# Patient Record
Sex: Male | Born: 1965 | Race: White | Hispanic: No | Marital: Married | Smoking: Former smoker
Health system: Southern US, Community
[De-identification: ages and names within clinical notes are randomized; demographics above are authoritative.]

## PROBLEM LIST (undated history)

## (undated) DIAGNOSIS — F419 Anxiety disorder, unspecified: Secondary | ICD-10-CM

## (undated) DIAGNOSIS — M109 Gout, unspecified: Secondary | ICD-10-CM

## (undated) DIAGNOSIS — Z87891 Personal history of nicotine dependence: Secondary | ICD-10-CM

## (undated) DIAGNOSIS — I10 Essential (primary) hypertension: Secondary | ICD-10-CM

## (undated) DIAGNOSIS — G459 Transient cerebral ischemic attack, unspecified: Secondary | ICD-10-CM

## (undated) DIAGNOSIS — I2510 Atherosclerotic heart disease of native coronary artery without angina pectoris: Secondary | ICD-10-CM

## (undated) DIAGNOSIS — E782 Mixed hyperlipidemia: Secondary | ICD-10-CM

## (undated) DIAGNOSIS — I4891 Unspecified atrial fibrillation: Secondary | ICD-10-CM

## (undated) HISTORY — DX: Transient cerebral ischemic attack, unspecified: G45.9

## (undated) HISTORY — DX: Gout, unspecified: M10.9

## (undated) HISTORY — PX: CARDIAC CATHETERIZATION: SHX172

## (undated) HISTORY — DX: Unspecified atrial fibrillation: I48.91

---

## 2016-06-11 DIAGNOSIS — Z7289 Other problems related to lifestyle: Secondary | ICD-10-CM | POA: Insufficient documentation

## 2016-06-11 DIAGNOSIS — R0789 Other chest pain: Secondary | ICD-10-CM | POA: Insufficient documentation

## 2016-06-11 DIAGNOSIS — G4700 Insomnia, unspecified: Secondary | ICD-10-CM | POA: Insufficient documentation

## 2016-06-11 DIAGNOSIS — I10 Essential (primary) hypertension: Secondary | ICD-10-CM | POA: Insufficient documentation

## 2016-06-11 DIAGNOSIS — Z789 Other specified health status: Secondary | ICD-10-CM | POA: Insufficient documentation

## 2017-11-16 DIAGNOSIS — I700 Atherosclerosis of aorta: Secondary | ICD-10-CM | POA: Insufficient documentation

## 2019-03-23 DIAGNOSIS — Z23 Encounter for immunization: Secondary | ICD-10-CM | POA: Diagnosis not present

## 2019-03-23 DIAGNOSIS — S61411A Laceration without foreign body of right hand, initial encounter: Secondary | ICD-10-CM | POA: Diagnosis not present

## 2019-04-05 ENCOUNTER — Other Ambulatory Visit (HOSPITAL_COMMUNITY)
Admission: RE | Admit: 2019-04-05 | Discharge: 2019-04-05 | Disposition: A | Discharge status: Home / Self Care | Payer: BC Managed Care – PPO | Source: Ambulatory Visit | Attending: Orthopedic Surgery | Admitting: Orthopedic Surgery

## 2019-04-05 ENCOUNTER — Encounter (HOSPITAL_BASED_OUTPATIENT_CLINIC_OR_DEPARTMENT_OTHER)
Admission: RE | Admit: 2019-04-05 | Discharge: 2019-04-05 | Disposition: A | Discharge status: Home / Self Care | Payer: BC Managed Care – PPO | Source: Ambulatory Visit | Attending: Orthopedic Surgery | Admitting: Orthopedic Surgery

## 2019-04-05 ENCOUNTER — Other Ambulatory Visit: Payer: Self-pay | Admitting: Orthopedic Surgery

## 2019-04-05 ENCOUNTER — Encounter (HOSPITAL_BASED_OUTPATIENT_CLINIC_OR_DEPARTMENT_OTHER): Payer: Self-pay | Admitting: *Deleted

## 2019-04-05 ENCOUNTER — Other Ambulatory Visit: Payer: Self-pay

## 2019-04-05 DIAGNOSIS — S66221A Laceration of extensor muscle, fascia and tendon of right thumb at wrist and hand level, initial encounter: Secondary | ICD-10-CM | POA: Diagnosis not present

## 2019-04-05 DIAGNOSIS — Z20828 Contact with and (suspected) exposure to other viral communicable diseases: Secondary | ICD-10-CM | POA: Diagnosis not present

## 2019-04-05 DIAGNOSIS — Z01812 Encounter for preprocedural laboratory examination: Secondary | ICD-10-CM | POA: Diagnosis not present

## 2019-04-05 DIAGNOSIS — M79644 Pain in right finger(s): Secondary | ICD-10-CM | POA: Diagnosis not present

## 2019-04-05 LAB — SARS CORONAVIRUS 2 (TAT 6-24 HRS): SARS Coronavirus 2: NEGATIVE

## 2019-04-05 NOTE — Progress Notes (Signed)

## 2019-04-07 ENCOUNTER — Other Ambulatory Visit: Payer: Self-pay

## 2019-04-07 ENCOUNTER — Ambulatory Visit (HOSPITAL_BASED_OUTPATIENT_CLINIC_OR_DEPARTMENT_OTHER)
Admission: RE | Admit: 2019-04-07 | Discharge: 2019-04-07 | Disposition: A | Discharge status: Home / Self Care | Payer: BC Managed Care – PPO | Attending: Orthopedic Surgery | Admitting: Orthopedic Surgery

## 2019-04-07 ENCOUNTER — Encounter (HOSPITAL_BASED_OUTPATIENT_CLINIC_OR_DEPARTMENT_OTHER)
Admission: RE | Disposition: A | Discharge status: Home / Self Care | Payer: Self-pay | Source: Home / Self Care | Attending: Orthopedic Surgery

## 2019-04-07 ENCOUNTER — Ambulatory Visit (HOSPITAL_BASED_OUTPATIENT_CLINIC_OR_DEPARTMENT_OTHER): Payer: BC Managed Care – PPO | Admitting: Anesthesiology

## 2019-04-07 ENCOUNTER — Encounter (HOSPITAL_BASED_OUTPATIENT_CLINIC_OR_DEPARTMENT_OTHER): Payer: Self-pay

## 2019-04-07 DIAGNOSIS — Z79899 Other long term (current) drug therapy: Secondary | ICD-10-CM | POA: Diagnosis not present

## 2019-04-07 DIAGNOSIS — Z7982 Long term (current) use of aspirin: Secondary | ICD-10-CM | POA: Insufficient documentation

## 2019-04-07 DIAGNOSIS — X58XXXA Exposure to other specified factors, initial encounter: Secondary | ICD-10-CM | POA: Insufficient documentation

## 2019-04-07 DIAGNOSIS — S61011A Laceration without foreign body of right thumb without damage to nail, initial encounter: Secondary | ICD-10-CM | POA: Insufficient documentation

## 2019-04-07 DIAGNOSIS — E782 Mixed hyperlipidemia: Secondary | ICD-10-CM | POA: Diagnosis not present

## 2019-04-07 DIAGNOSIS — S66221A Laceration of extensor muscle, fascia and tendon of right thumb at wrist and hand level, initial encounter: Secondary | ICD-10-CM | POA: Diagnosis not present

## 2019-04-07 DIAGNOSIS — S56321A Laceration of extensor or abductor muscles, fascia and tendons of right thumb at forearm level, initial encounter: Secondary | ICD-10-CM | POA: Diagnosis not present

## 2019-04-07 DIAGNOSIS — G8918 Other acute postprocedural pain: Secondary | ICD-10-CM | POA: Diagnosis not present

## 2019-04-07 DIAGNOSIS — I10 Essential (primary) hypertension: Secondary | ICD-10-CM | POA: Diagnosis not present

## 2019-04-07 HISTORY — PX: REPAIR EXTENSOR TENDON: SHX5382

## 2019-04-07 HISTORY — DX: Essential (primary) hypertension: I10

## 2019-04-07 HISTORY — DX: Mixed hyperlipidemia: E78.2

## 2019-04-07 HISTORY — DX: Anxiety disorder, unspecified: F41.9

## 2019-04-07 SURGERY — REPAIR, TENDON, EXTENSOR
Anesthesia: Choice | Site: Thumb | Laterality: Right

## 2019-04-07 MED ORDER — PROPOFOL 10 MG/ML IV BOLUS
INTRAVENOUS | Status: DC | PRN
  Administered 2019-04-07: 170 mg via INTRAVENOUS

## 2019-04-07 MED ORDER — MEPERIDINE HCL 25 MG/ML IJ SOLN: 6.2500 mg | INTRAMUSCULAR | Status: DC | PRN

## 2019-04-07 MED ORDER — LIDOCAINE HCL (CARDIAC) PF 100 MG/5ML IV SOSY
PREFILLED_SYRINGE | INTRAVENOUS | Status: DC | PRN
  Administered 2019-04-07: 60 mg via INTRAVENOUS

## 2019-04-07 MED ORDER — LACTATED RINGERS IV SOLN
INTRAVENOUS | Status: DC
  Administered 2019-04-07: 11:00:00 via INTRAVENOUS

## 2019-04-07 MED ORDER — MIDAZOLAM HCL 2 MG/2ML IJ SOLN
1.0000 mg | INTRAMUSCULAR | Status: DC | PRN
  Administered 2019-04-07: 2 mg via INTRAVENOUS

## 2019-04-07 MED ORDER — HYDROCODONE-ACETAMINOPHEN 5-325 MG PO TABS: ORAL_TABLET | ORAL | 0 refills | Status: DC

## 2019-04-07 MED ORDER — DEXAMETHASONE SODIUM PHOSPHATE 10 MG/ML IJ SOLN
INTRAMUSCULAR | Status: DC | PRN
  Administered 2019-04-07: 4 mg via INTRAVENOUS

## 2019-04-07 MED ORDER — ACETAMINOPHEN 10 MG/ML IV SOLN: 1000.0000 mg | Freq: Once | INTRAVENOUS | Status: DC | PRN

## 2019-04-07 MED ORDER — CEFAZOLIN SODIUM-DEXTROSE 2-4 GM/100ML-% IV SOLN
2.0000 g | INTRAVENOUS | Status: AC
  Administered 2019-04-07: 12:00:00 2 g via INTRAVENOUS

## 2019-04-07 MED ORDER — HYDROMORPHONE HCL 1 MG/ML IJ SOLN: 0.2500 mg | INTRAMUSCULAR | Status: DC | PRN

## 2019-04-07 MED ORDER — FENTANYL CITRATE (PF) 100 MCG/2ML IJ SOLN
INTRAMUSCULAR | Status: AC
  Filled 2019-04-07: qty 2

## 2019-04-07 MED ORDER — MIDAZOLAM HCL 2 MG/2ML IJ SOLN
INTRAMUSCULAR | Status: AC
  Filled 2019-04-07: qty 2

## 2019-04-07 MED ORDER — ROPIVACAINE HCL 5 MG/ML IJ SOLN
INTRAMUSCULAR | Status: DC | PRN
  Administered 2019-04-07: 30 mL via PERINEURAL

## 2019-04-07 MED ORDER — CEFAZOLIN SODIUM-DEXTROSE 2-4 GM/100ML-% IV SOLN
INTRAVENOUS | Status: AC
  Filled 2019-04-07: qty 100

## 2019-04-07 MED ORDER — CHLORHEXIDINE GLUCONATE 4 % EX LIQD: 60.0000 mL | Freq: Once | CUTANEOUS | Status: DC

## 2019-04-07 MED ORDER — CLONIDINE HCL (ANALGESIA) 100 MCG/ML EP SOLN
EPIDURAL | Status: DC | PRN
  Administered 2019-04-07: 100 ug

## 2019-04-07 MED ORDER — PROMETHAZINE HCL 25 MG/ML IJ SOLN: 6.2500 mg | INTRAMUSCULAR | Status: DC | PRN

## 2019-04-07 MED ORDER — FENTANYL CITRATE (PF) 100 MCG/2ML IJ SOLN
50.0000 ug | INTRAMUSCULAR | Status: DC | PRN
  Administered 2019-04-07: 100 ug via INTRAVENOUS

## 2019-04-07 MED ORDER — ONDANSETRON HCL 4 MG/2ML IJ SOLN
INTRAMUSCULAR | Status: DC | PRN
  Administered 2019-04-07: 4 mg via INTRAVENOUS

## 2019-04-07 MED ORDER — HYDROCODONE-ACETAMINOPHEN 7.5-325 MG PO TABS: 1.0000 | ORAL_TABLET | Freq: Once | ORAL | Status: DC | PRN

## 2019-04-07 SURGICAL SUPPLY — 85 items
BAG DECANTER FOR FLEXI CONT (MISCELLANEOUS) IMPLANT
BENZOIN TINCTURE PRP APPL 2/3 (GAUZE/BANDAGES/DRESSINGS) IMPLANT
BLADE MINI RND TIP GREEN BEAV (BLADE) IMPLANT
BLADE SURG 15 STRL LF DISP TIS (BLADE) ×2 IMPLANT
BLADE SURG 15 STRL SS (BLADE) ×4
BNDG CONFORM 2 STRL LF (GAUZE/BANDAGES/DRESSINGS) IMPLANT
BNDG ELASTIC 2X5.8 VLCR STR LF (GAUZE/BANDAGES/DRESSINGS) IMPLANT
BNDG ELASTIC 3X5.8 VLCR STR LF (GAUZE/BANDAGES/DRESSINGS) IMPLANT
BNDG ESMARK 4X9 LF (GAUZE/BANDAGES/DRESSINGS) ×3 IMPLANT
BNDG GAUZE ELAST 4 BULKY (GAUZE/BANDAGES/DRESSINGS) IMPLANT
CHLORAPREP W/TINT 26 (MISCELLANEOUS) ×3 IMPLANT
CLOSURE WOUND 1/2 X4 (GAUZE/BANDAGES/DRESSINGS)
CORD BIPOLAR FORCEPS 12FT (ELECTRODE) ×3 IMPLANT
COTTONBALL LRG STERILE PKG (GAUZE/BANDAGES/DRESSINGS) IMPLANT
COVER BACK TABLE REUSABLE LG (DRAPES) ×3 IMPLANT
COVER MAYO STAND REUSABLE (DRAPES) ×3 IMPLANT
COVER WAND RF STERILE (DRAPES) IMPLANT
CUFF TOURN SGL QUICK 18X4 (TOURNIQUET CUFF) ×3 IMPLANT
DECANTER SPIKE VIAL GLASS SM (MISCELLANEOUS) IMPLANT
DRAIN TLS ROUND 10FR (DRAIN) IMPLANT
DRAPE EXTREMITY T 121X128X90 (DISPOSABLE) ×3 IMPLANT
DRAPE OEC MINIVIEW 54X84 (DRAPES) IMPLANT
DRAPE SURG 17X23 STRL (DRAPES) ×3 IMPLANT
DRSG PAD ABDOMINAL 8X10 ST (GAUZE/BANDAGES/DRESSINGS) IMPLANT
GAUZE 4X4 16PLY RFD (DISPOSABLE) IMPLANT
GAUZE SPONGE 4X4 12PLY STRL (GAUZE/BANDAGES/DRESSINGS) ×3 IMPLANT
GAUZE XEROFORM 1X8 LF (GAUZE/BANDAGES/DRESSINGS) ×3 IMPLANT
GLOVE BIO SURGEON STRL SZ7.5 (GLOVE) ×3 IMPLANT
GLOVE BIOGEL PI IND STRL 6.5 (GLOVE) ×1 IMPLANT
GLOVE BIOGEL PI IND STRL 7.0 (GLOVE) ×1 IMPLANT
GLOVE BIOGEL PI IND STRL 8 (GLOVE) ×1 IMPLANT
GLOVE BIOGEL PI INDICATOR 6.5 (GLOVE) ×2
GLOVE BIOGEL PI INDICATOR 7.0 (GLOVE) ×2
GLOVE BIOGEL PI INDICATOR 8 (GLOVE) ×2
GOWN STRL REUS W/ TWL LRG LVL3 (GOWN DISPOSABLE) ×1 IMPLANT
GOWN STRL REUS W/TWL LRG LVL3 (GOWN DISPOSABLE) ×2
GOWN STRL REUS W/TWL XL LVL3 (GOWN DISPOSABLE) ×3 IMPLANT
K-WIRE .035X4 (WIRE) IMPLANT
LOOP VESSEL MAXI BLUE (MISCELLANEOUS) IMPLANT
NEEDLE HYPO 22GX1.5 SAFETY (NEEDLE) IMPLANT
NEEDLE HYPO 25X1 1.5 SAFETY (NEEDLE) IMPLANT
NEEDLE KEITH (NEEDLE) IMPLANT
NS IRRIG 1000ML POUR BTL (IV SOLUTION) ×3 IMPLANT
PACK BASIN DAY SURGERY FS (CUSTOM PROCEDURE TRAY) ×3 IMPLANT
PAD CAST 3X4 CTTN HI CHSV (CAST SUPPLIES) ×1 IMPLANT
PAD CAST 4YDX4 CTTN HI CHSV (CAST SUPPLIES) IMPLANT
PADDING CAST ABS 3INX4YD NS (CAST SUPPLIES)
PADDING CAST ABS 4INX4YD NS (CAST SUPPLIES) ×2
PADDING CAST ABS COTTON 3X4 (CAST SUPPLIES) IMPLANT
PADDING CAST ABS COTTON 4X4 ST (CAST SUPPLIES) ×1 IMPLANT
PADDING CAST COTTON 3X4 STRL (CAST SUPPLIES) ×2
PADDING CAST COTTON 4X4 STRL (CAST SUPPLIES)
SLEEVE SCD COMPRESS KNEE MED (MISCELLANEOUS) ×3 IMPLANT
SLING ARM FOAM STRAP XLG (SOFTGOODS) ×3 IMPLANT
SPLINT PLASTER CAST XFAST 3X15 (CAST SUPPLIES) ×20 IMPLANT
SPLINT PLASTER CAST XFAST 4X15 (CAST SUPPLIES) IMPLANT
SPLINT PLASTER XTRA FAST SET 4 (CAST SUPPLIES)
SPLINT PLASTER XTRA FASTSET 3X (CAST SUPPLIES) ×40
STOCKINETTE 4X48 STRL (DRAPES) ×3 IMPLANT
STRIP CLOSURE SKIN 1/2X4 (GAUZE/BANDAGES/DRESSINGS) IMPLANT
SUT CHROMIC 5 0 P 3 (SUTURE) IMPLANT
SUT ETHIBOND 3-0 V-5 (SUTURE) ×3 IMPLANT
SUT ETHILON 3 0 PS 1 (SUTURE) IMPLANT
SUT ETHILON 4 0 PS 2 18 (SUTURE) ×3 IMPLANT
SUT FIBERWIRE 3-0 18 TAPR NDL (SUTURE)
SUT FIBERWIRE 4-0 18 DIAM BLUE (SUTURE)
SUT MERSILENE 2.0 SH NDLE (SUTURE) IMPLANT
SUT MERSILENE 4 0 P 3 (SUTURE) IMPLANT
SUT MNCRL AB 4-0 PS2 18 (SUTURE) IMPLANT
SUT PROLENE 2 0 SH DA (SUTURE) IMPLANT
SUT PROLENE 6 0 P 1 18 (SUTURE) IMPLANT
SUT SILK 2 0 PERMA HAND 18 BK (SUTURE) ×6 IMPLANT
SUT SILK 4 0 PS 2 (SUTURE) IMPLANT
SUT VIC AB 3-0 PS1 18 (SUTURE)
SUT VIC AB 3-0 PS1 18XBRD (SUTURE) IMPLANT
SUT VIC AB 4-0 P-3 18XBRD (SUTURE) IMPLANT
SUT VIC AB 4-0 P3 18 (SUTURE)
SUT VICRYL 4-0 PS2 18IN ABS (SUTURE) IMPLANT
SUTURE FIBERWR 3-0 18 TAPR NDL (SUTURE) IMPLANT
SUTURE FIBERWR 4-0 18 DIA BLUE (SUTURE) IMPLANT
SYR BULB 3OZ (MISCELLANEOUS) ×3 IMPLANT
SYR CONTROL 10ML LL (SYRINGE) IMPLANT
TOWEL GREEN STERILE FF (TOWEL DISPOSABLE) ×6 IMPLANT
TUBE FEEDING ENTERAL 5FR 16IN (TUBING) IMPLANT
UNDERPAD 30X36 HEAVY ABSORB (UNDERPADS AND DIAPERS) ×3 IMPLANT

## 2019-04-07 NOTE — Anesthesia Postprocedure Evaluation (Signed)
Anesthesia Post Note  Patient: Sean Flynn  Procedure(s) Performed: REPAIR RIGHT THUMB EXTENSOR TENDON (Right Thumb)     Patient location during evaluation: PACU Anesthesia Type: General Level of consciousness: awake and alert Pain management: pain level controlled Vital Signs Assessment: post-procedure vital signs reviewed and stable Respiratory status: spontaneous breathing, nonlabored ventilation, respiratory function stable and patient connected to nasal cannula oxygen Cardiovascular status: blood pressure returned to baseline and stable Postop Assessment: no apparent nausea or vomiting Anesthetic complications: no    Last Vitals:  Vitals:   04/07/19 1330 04/07/19 1355  BP: (!) 141/83 (!) 136/93  Pulse: 72 73  Resp: 20 20  Temp:  36.9 C  SpO2: 94% 94%    Last Pain:  Vitals:   04/07/19 1355  TempSrc:   PainSc: 0-No pain                 Barnet Glasgow

## 2019-04-07 NOTE — Anesthesia Preprocedure Evaluation (Addendum)
Anesthesia Evaluation  Patient identified by MRN, date of birth, ID band Patient awake    Reviewed: Allergy & Precautions, NPO status , Patient's Chart, lab work & pertinent test results  History of Anesthesia Complications (+) PONV  Airway Mallampati: III  TM Distance: >3 FB Neck ROM: Full    Dental no notable dental hx. (+) Teeth Intact   Pulmonary neg pulmonary ROS,    Pulmonary exam normal breath sounds clear to auscultation       Cardiovascular hypertension, Pt. on medications Normal cardiovascular exam Rhythm:Regular Rate:Normal     Neuro/Psych Anxiety negative neurological ROS     GI/Hepatic Neg liver ROS,   Endo/Other  negative endocrine ROS  Renal/GU negative Renal ROS     Musculoskeletal negative musculoskeletal ROS (+)   Abdominal (+) + obese,   Peds  Hematology negative hematology ROS (+)   Anesthesia Other Findings   Reproductive/Obstetrics                            Anesthesia Physical Anesthesia Plan  ASA: III  Anesthesia Plan: General   Post-op Pain Management: GA combined w/ Regional for post-op pain   Induction:   PONV Risk Score and Plan: 3 and Treatment may vary due to age or medical condition, Ondansetron, Dexamethasone and Midazolam  Airway Management Planned: LMA  Additional Equipment:   Intra-op Plan:   Post-operative Plan:   Informed Consent: I have reviewed the patients History and Physical, chart, labs and discussed the procedure including the risks, benefits and alternatives for the proposed anesthesia with the patient or authorized representative who has indicated his/her understanding and acceptance.     Dental advisory given  Plan Discussed with: CRNA  Anesthesia Plan Comments: (LMA w Supraclavicular block)        Anesthesia Quick Evaluation

## 2019-04-07 NOTE — Discharge Instructions (Addendum)

## 2019-04-07 NOTE — H&P (Signed)
Sean Flynn is an 53 y.o. male.   Chief Complaint: right thumb extensor tendon lacerations HPI: 53 yo male sustained laceration to dorsum right thumb 2 weeks ago. Unable to extend thumb.  He wishes to proceed with operative exploration with repair of extensor tendon as necessary.  Allergies: No Known Allergies  Past Medical History:  Diagnosis Date  . Anxiety   . Hypertension   . Mixed hyperlipidemia     History reviewed. No pertinent surgical history.  Family History: History reviewed. No pertinent family history.  Social History:   reports that he has never smoked. He has never used smokeless tobacco. He reports current alcohol use. He reports that he does not use drugs.  Medications: No medications prior to admission.    Results for orders placed or performed during the hospital encounter of 04/05/19 (from the past 48 hour(s))  SARS CORONAVIRUS 2 (TAT 6-24 HRS) Nasopharyngeal Nasopharyngeal Swab     Status: None   Collection Time: 04/05/19 11:21 AM   Specimen: Nasopharyngeal Swab  Result Value Ref Range   SARS Coronavirus 2 NEGATIVE NEGATIVE    Comment: (NOTE) SARS-CoV-2 target nucleic acids are NOT DETECTED. The SARS-CoV-2 RNA is generally detectable in upper and lower respiratory specimens during the acute phase of infection. Negative results do not preclude SARS-CoV-2 infection, do not rule out co-infections with other pathogens, and should not be used as the sole basis for treatment or other patient management decisions. Negative results must be combined with clinical observations, patient history, and epidemiological information. The expected result is Negative. Fact Sheet for Patients: SugarRoll.be Fact Sheet for Healthcare Providers: https://www.woods-mathews.com/ This test is not yet approved or cleared by the [place] FDA and  has been authorized for detection and/or diagnosis of SARS-CoV-2 by FDA under an  Emergency Use Authorization (EUA). This EUA will remain  in effect (meaning this test can be used) for the duration of the COVID-19 declaration under Section 56 4(b)(1) of the Act, 21 U.S.C. section 360bbb-3(b)(1), unless the authorization is terminated or revoked sooner. Performed at [facility], [place] [address], [place], [place] [address]     No results found.   A comprehensive review of systems was negative.  Height 5\' 9"  (1.753 m), weight 111.1 kg.  General appearance: alert, cooperative and appears stated age Head: Normocephalic, without obvious abnormality, atraumatic Neck: supple, symmetrical, trachea midline Cardio: regular rate and rhythm Resp: clear to auscultation bilaterally Extremities: Intact sensation and capillary refill all digits.  +fpl/io.  No wounds. Unable to extend thumb, especially at mp joint. Pulses: 2+ and symmetric Skin: Skin color, texture, turgor normal. No rashes or lesions Neurologic: Grossly normal Incision/Wound: healed wound at dorsum right thumb mp joint  Assessment/Plan Right thumb laceration with extensor tendon laceration.  Non operative and operative treatment options have been discussed with the patient and patient wishes to proceed with operative treatment. Risks, benefits, and alternatives of surgery have been discussed and the patient agrees with the plan of care.   [name] 04/07/2019, 8:28 AM

## 2019-04-07 NOTE — Anesthesia Procedure Notes (Signed)
Procedure Name: LMA Insertion Date/Time: 04/07/2019 12:03 PM Performed by: Raenette Rover, CRNA Pre-anesthesia Checklist: Patient identified, Emergency Drugs available, Suction available and Patient being monitored Patient Re-evaluated:Patient Re-evaluated prior to induction Oxygen Delivery Method: Circle system utilized Preoxygenation: Pre-oxygenation with 100% oxygen Induction Type: IV induction LMA: LMA inserted LMA Size: 4.0 Number of attempts: 1 Placement Confirmation: positive ETCO2 and breath sounds checked- equal and bilateral Tube secured with: Tape Dental Injury: Teeth and Oropharynx as per pre-operative assessment

## 2019-04-07 NOTE — Progress Notes (Signed)
Assisted Dr. Houser with right, ultrasound guided, supraclavicular block. Side rails up, monitors on throughout procedure. See vital signs in flow sheet. Tolerated Procedure well. 

## 2019-04-07 NOTE — Op Note (Signed)
NAME: Sean Flynn MEDICAL RECORD NO: [id number] DATE OF BIRTH: 12-06-1965 [facility] LOCATION: [facility] PHYSICIAN: Tami RibasKEVIN R. Ellisyn Icenhower, MD   OPERATIVE REPORT   DATE OF PROCEDURE: 04/07/19    PREOPERATIVE DIAGNOSIS:   Right thumb laceration with EPL and EPB lacerations   POSTOPERATIVE DIAGNOSIS:   Right thumb laceration with EPL and EPB lacerations   PROCEDURE:   1.  Right thumb repair of EPL laceration at MP joint 2.  Right thumb repair of EPB laceration at MP joint   SURGEON:  Betha LoaKevin Lindsy Cerullo, M.D.   ASSISTANT: none   ANESTHESIA:  General with regional   INTRAVENOUS FLUIDS:  Per anesthesia flow sheet.   ESTIMATED BLOOD LOSS:  Minimal.   COMPLICATIONS:  None.   SPECIMENS:  none   TOURNIQUET TIME:    Total Tourniquet Time Documented: Upper Arm (Right) - 40 minutes Total: Upper Arm (Right) - 40 minutes    DISPOSITION:  Stable to PACU.   INDICATIONS: 53 year old male states he sustained a laceration to the dorsum of his right thumb approximately 2 weeks ago.  He has been unable to extend the thumb.  Recommended operative exploration with repair of extensor tendons as necessary. Risks, benefits and alternatives of surgery were discussed including the risks of blood loss, infection, damage to nerves, vessels, tendons, ligaments, bone for surgery, need for additional surgery, complications with wound healing, continued pain, stiffness.  He voiced understanding of these risks and elected to proceed.  OPERATIVE COURSE:  After being identified preoperatively by myself,  the patient and I agreed on the procedure and site of the procedure.  The surgical site was marked.  Surgical consent had been signed. He was given IV antibiotics as preoperative antibiotic prophylaxis. He was transferred to the operating room and placed on the operating table in supine position with the Right upper extremity on an arm board.  General anesthesia was induced by the  anesthesiologist. A regional block had been performed by anesthesia in preoperative holding.   Right upper extremity was prepped and draped in normal sterile orthopedic fashion.  A surgical pause was performed between the surgeons, anesthesia, and operating room staff and all were in agreement as to the patient, procedure, and site of procedure.  Tourniquet at the proximal aspect of the extremity was inflated to 250 mmHg after exsanguination of the arm with an Esmarch bandage.    Incision was made over the previous wound which had healed.  This was extended both proximally and distally.  This was carried in subcutaneous tissues by spreading technique.  Soft tissues were mobilized.  There was laceration of the EPL and EPB tendons.  The distal stumps were easily located and freed up of early scarring.  The proximal stumps were able to be retrieved through the same wound and brought back out to length.  The early scarring attachments were taken down.  A 3-0 Ethibond suture was used in a modified Kessler technique to reapproximate the EPL tendon.  This was then oversewn with figure-of-eight sutures.  The EPB tendon was reapproximated using a figure-of-eight type suture with a 3-0 Ethibond.  This provided good reapproximation of the tendon ends.  There was some increased tension that was felt that this would be easily worked out with therapy.  The wound was copiously irrigated with sterile saline.  Was closed with 4-0 nylon in a horizontal mattress fashion.  It was dressed with sterile Xeroform 4 x 4's and wrapped with a Kerlix bandage.  A volar  thumb spica splint was placed with the wrist approximately 45 degrees extension and a thumb in full extension.  This was wrapped with Kerlix and Ace bandage.  The tourniquet was deflated at 40 minutes.  Fingertips were pink with brisk capillary refill after deflation of tourniquet.  The operative  drapes were broken down.  The patient was awoken from anesthesia safely.  He was  transferred back to the stretcher and taken to PACU in stable condition.  I will see him back in the office in 1 week for postoperative followup.  I will give him a prescription for Norco 5/325 1-2 tabs PO q6 hours prn pain, dispense # 20.   [name], MD Electronically signed, 04/07/19

## 2019-04-07 NOTE — Anesthesia Procedure Notes (Signed)
Anesthesia Regional Block: Supraclavicular block   Pre-Anesthetic Checklist: ,, timeout performed, Correct Patient, Correct Site, Correct Laterality, Correct Procedure, Correct Position, site marked, Risks and benefits discussed,  Surgical consent,  Pre-op evaluation,  At surgeon's request and post-op pain management  Laterality: Right  Prep: chloraprep       Needles:  Injection technique: Single-shot  Needle Type: Echogenic Needle     Needle Length: 5cm  Needle Gauge: 21     Additional Needles:   Procedures:,,,, ultrasound used (permanent image in chart),,,,  Narrative:  Start time: 04/07/2019 11:34 AM End time: 04/07/2019 11:44 AM Injection made incrementally with aspirations every 5 mL.  Performed by: Personally  Anesthesiologist: Barnet Glasgow, MD

## 2019-04-07 NOTE — Transfer of Care (Signed)
Immediate Anesthesia Transfer of Care Note  Patient: Sean Flynn  Procedure(s) Performed: REPAIR RIGHT THUMB EXTENSOR TENDON (Right Thumb)  Patient Location: PACU  Anesthesia Type:GA combined with regional for post-op pain  Level of Consciousness: awake, alert , oriented and patient cooperative  Airway & Oxygen Therapy: Patient Spontanous Breathing and Patient connected to face mask oxygen  Post-op Assessment: Report given to RN and Post -op Vital signs reviewed and stable  Post vital signs: Reviewed and stable  Last Vitals:  Vitals Value Taken Time  BP 145/83 04/07/19 1303  Temp    Pulse 70 04/07/19 1305  Resp 16 04/07/19 1305  SpO2 96 % 04/07/19 1305  Vitals shown include unvalidated device data.  Last Pain:  Vitals:   04/07/19 1046  TempSrc: Oral  PainSc: 6       Patients Stated Pain Goal: 6 (80/32/12 2482)  Complications: No apparent anesthesia complications

## 2019-04-08 ENCOUNTER — Encounter (HOSPITAL_BASED_OUTPATIENT_CLINIC_OR_DEPARTMENT_OTHER): Payer: Self-pay | Admitting: Orthopedic Surgery

## 2019-04-13 DIAGNOSIS — S66221A Laceration of extensor muscle, fascia and tendon of right thumb at wrist and hand level, initial encounter: Secondary | ICD-10-CM | POA: Diagnosis not present

## 2019-04-13 DIAGNOSIS — M25641 Stiffness of right hand, not elsewhere classified: Secondary | ICD-10-CM | POA: Diagnosis not present

## 2019-04-13 DIAGNOSIS — S66229A Laceration of extensor muscle, fascia and tendon of unspecified thumb at wrist and hand level, initial encounter: Secondary | ICD-10-CM | POA: Diagnosis not present

## 2019-05-04 DIAGNOSIS — M25641 Stiffness of right hand, not elsewhere classified: Secondary | ICD-10-CM | POA: Diagnosis not present

## 2019-05-04 DIAGNOSIS — S66229A Laceration of extensor muscle, fascia and tendon of unspecified thumb at wrist and hand level, initial encounter: Secondary | ICD-10-CM | POA: Diagnosis not present

## 2019-05-16 DIAGNOSIS — M25641 Stiffness of right hand, not elsewhere classified: Secondary | ICD-10-CM | POA: Diagnosis not present

## 2019-05-16 DIAGNOSIS — S66229A Laceration of extensor muscle, fascia and tendon of unspecified thumb at wrist and hand level, initial encounter: Secondary | ICD-10-CM | POA: Diagnosis not present

## 2019-05-30 DIAGNOSIS — S66229A Laceration of extensor muscle, fascia and tendon of unspecified thumb at wrist and hand level, initial encounter: Secondary | ICD-10-CM | POA: Diagnosis not present

## 2019-05-30 DIAGNOSIS — M25641 Stiffness of right hand, not elsewhere classified: Secondary | ICD-10-CM | POA: Diagnosis not present

## 2019-06-01 DIAGNOSIS — M9903 Segmental and somatic dysfunction of lumbar region: Secondary | ICD-10-CM | POA: Diagnosis not present

## 2019-06-01 DIAGNOSIS — M5136 Other intervertebral disc degeneration, lumbar region: Secondary | ICD-10-CM | POA: Diagnosis not present

## 2019-06-06 DIAGNOSIS — M5136 Other intervertebral disc degeneration, lumbar region: Secondary | ICD-10-CM | POA: Diagnosis not present

## 2019-06-06 DIAGNOSIS — M9903 Segmental and somatic dysfunction of lumbar region: Secondary | ICD-10-CM | POA: Diagnosis not present

## 2019-06-08 DIAGNOSIS — M5136 Other intervertebral disc degeneration, lumbar region: Secondary | ICD-10-CM | POA: Diagnosis not present

## 2019-06-08 DIAGNOSIS — M25641 Stiffness of right hand, not elsewhere classified: Secondary | ICD-10-CM | POA: Diagnosis not present

## 2019-06-08 DIAGNOSIS — M9903 Segmental and somatic dysfunction of lumbar region: Secondary | ICD-10-CM | POA: Diagnosis not present

## 2019-06-08 DIAGNOSIS — S66229A Laceration of extensor muscle, fascia and tendon of unspecified thumb at wrist and hand level, initial encounter: Secondary | ICD-10-CM | POA: Diagnosis not present

## 2019-06-09 DIAGNOSIS — M5136 Other intervertebral disc degeneration, lumbar region: Secondary | ICD-10-CM | POA: Diagnosis not present

## 2019-06-09 DIAGNOSIS — M9903 Segmental and somatic dysfunction of lumbar region: Secondary | ICD-10-CM | POA: Diagnosis not present

## 2019-06-13 DIAGNOSIS — M5136 Other intervertebral disc degeneration, lumbar region: Secondary | ICD-10-CM | POA: Diagnosis not present

## 2019-06-13 DIAGNOSIS — M9903 Segmental and somatic dysfunction of lumbar region: Secondary | ICD-10-CM | POA: Diagnosis not present

## 2019-06-14 DIAGNOSIS — M9903 Segmental and somatic dysfunction of lumbar region: Secondary | ICD-10-CM | POA: Diagnosis not present

## 2019-06-14 DIAGNOSIS — M5136 Other intervertebral disc degeneration, lumbar region: Secondary | ICD-10-CM | POA: Diagnosis not present

## 2019-06-15 DIAGNOSIS — M1A09X0 Idiopathic chronic gout, multiple sites, without tophus (tophi): Secondary | ICD-10-CM | POA: Insufficient documentation

## 2019-06-15 DIAGNOSIS — I10 Essential (primary) hypertension: Secondary | ICD-10-CM | POA: Diagnosis not present

## 2019-06-15 DIAGNOSIS — M549 Dorsalgia, unspecified: Secondary | ICD-10-CM | POA: Diagnosis not present

## 2019-06-15 DIAGNOSIS — M5136 Other intervertebral disc degeneration, lumbar region: Secondary | ICD-10-CM | POA: Diagnosis not present

## 2019-06-15 DIAGNOSIS — M5442 Lumbago with sciatica, left side: Secondary | ICD-10-CM | POA: Diagnosis not present

## 2019-06-15 DIAGNOSIS — E782 Mixed hyperlipidemia: Secondary | ICD-10-CM | POA: Diagnosis not present

## 2019-06-15 DIAGNOSIS — G8929 Other chronic pain: Secondary | ICD-10-CM | POA: Insufficient documentation

## 2019-06-15 DIAGNOSIS — M9903 Segmental and somatic dysfunction of lumbar region: Secondary | ICD-10-CM | POA: Diagnosis not present

## 2019-06-15 DIAGNOSIS — R7989 Other specified abnormal findings of blood chemistry: Secondary | ICD-10-CM | POA: Insufficient documentation

## 2019-06-15 DIAGNOSIS — E785 Hyperlipidemia, unspecified: Secondary | ICD-10-CM | POA: Insufficient documentation

## 2019-06-22 DIAGNOSIS — M9903 Segmental and somatic dysfunction of lumbar region: Secondary | ICD-10-CM | POA: Diagnosis not present

## 2019-06-22 DIAGNOSIS — M5136 Other intervertebral disc degeneration, lumbar region: Secondary | ICD-10-CM | POA: Diagnosis not present

## 2019-06-23 DIAGNOSIS — M5136 Other intervertebral disc degeneration, lumbar region: Secondary | ICD-10-CM | POA: Diagnosis not present

## 2019-06-23 DIAGNOSIS — M9903 Segmental and somatic dysfunction of lumbar region: Secondary | ICD-10-CM | POA: Diagnosis not present

## 2019-06-27 DIAGNOSIS — M9903 Segmental and somatic dysfunction of lumbar region: Secondary | ICD-10-CM | POA: Diagnosis not present

## 2019-06-27 DIAGNOSIS — M5136 Other intervertebral disc degeneration, lumbar region: Secondary | ICD-10-CM | POA: Diagnosis not present

## 2019-06-29 DIAGNOSIS — M9903 Segmental and somatic dysfunction of lumbar region: Secondary | ICD-10-CM | POA: Diagnosis not present

## 2019-06-29 DIAGNOSIS — M5136 Other intervertebral disc degeneration, lumbar region: Secondary | ICD-10-CM | POA: Diagnosis not present

## 2019-07-04 DIAGNOSIS — M9903 Segmental and somatic dysfunction of lumbar region: Secondary | ICD-10-CM | POA: Diagnosis not present

## 2019-07-04 DIAGNOSIS — M5136 Other intervertebral disc degeneration, lumbar region: Secondary | ICD-10-CM | POA: Diagnosis not present

## 2019-07-06 DIAGNOSIS — M5136 Other intervertebral disc degeneration, lumbar region: Secondary | ICD-10-CM | POA: Diagnosis not present

## 2019-07-06 DIAGNOSIS — M9903 Segmental and somatic dysfunction of lumbar region: Secondary | ICD-10-CM | POA: Diagnosis not present

## 2019-07-11 DIAGNOSIS — M5136 Other intervertebral disc degeneration, lumbar region: Secondary | ICD-10-CM | POA: Diagnosis not present

## 2019-07-11 DIAGNOSIS — M9903 Segmental and somatic dysfunction of lumbar region: Secondary | ICD-10-CM | POA: Diagnosis not present

## 2019-07-13 DIAGNOSIS — M5136 Other intervertebral disc degeneration, lumbar region: Secondary | ICD-10-CM | POA: Diagnosis not present

## 2019-07-13 DIAGNOSIS — M9903 Segmental and somatic dysfunction of lumbar region: Secondary | ICD-10-CM | POA: Diagnosis not present

## 2019-07-18 DIAGNOSIS — M9903 Segmental and somatic dysfunction of lumbar region: Secondary | ICD-10-CM | POA: Diagnosis not present

## 2019-07-18 DIAGNOSIS — M5136 Other intervertebral disc degeneration, lumbar region: Secondary | ICD-10-CM | POA: Diagnosis not present

## 2019-07-21 DIAGNOSIS — M5136 Other intervertebral disc degeneration, lumbar region: Secondary | ICD-10-CM | POA: Diagnosis not present

## 2019-07-21 DIAGNOSIS — M9903 Segmental and somatic dysfunction of lumbar region: Secondary | ICD-10-CM | POA: Diagnosis not present

## 2019-07-26 DIAGNOSIS — M9903 Segmental and somatic dysfunction of lumbar region: Secondary | ICD-10-CM | POA: Diagnosis not present

## 2019-07-26 DIAGNOSIS — M5136 Other intervertebral disc degeneration, lumbar region: Secondary | ICD-10-CM | POA: Diagnosis not present

## 2019-08-03 DIAGNOSIS — M5136 Other intervertebral disc degeneration, lumbar region: Secondary | ICD-10-CM | POA: Diagnosis not present

## 2019-08-03 DIAGNOSIS — M9903 Segmental and somatic dysfunction of lumbar region: Secondary | ICD-10-CM | POA: Diagnosis not present

## 2019-08-10 DIAGNOSIS — M5136 Other intervertebral disc degeneration, lumbar region: Secondary | ICD-10-CM | POA: Diagnosis not present

## 2019-08-10 DIAGNOSIS — M9903 Segmental and somatic dysfunction of lumbar region: Secondary | ICD-10-CM | POA: Diagnosis not present

## 2019-08-17 DIAGNOSIS — M5136 Other intervertebral disc degeneration, lumbar region: Secondary | ICD-10-CM | POA: Diagnosis not present

## 2019-08-17 DIAGNOSIS — M9903 Segmental and somatic dysfunction of lumbar region: Secondary | ICD-10-CM | POA: Diagnosis not present

## 2019-08-24 DIAGNOSIS — M5136 Other intervertebral disc degeneration, lumbar region: Secondary | ICD-10-CM | POA: Diagnosis not present

## 2019-08-24 DIAGNOSIS — M9903 Segmental and somatic dysfunction of lumbar region: Secondary | ICD-10-CM | POA: Diagnosis not present

## 2019-08-31 DIAGNOSIS — M5136 Other intervertebral disc degeneration, lumbar region: Secondary | ICD-10-CM | POA: Diagnosis not present

## 2019-08-31 DIAGNOSIS — M9903 Segmental and somatic dysfunction of lumbar region: Secondary | ICD-10-CM | POA: Diagnosis not present

## 2019-09-14 DIAGNOSIS — M9903 Segmental and somatic dysfunction of lumbar region: Secondary | ICD-10-CM | POA: Diagnosis not present

## 2019-09-14 DIAGNOSIS — M5136 Other intervertebral disc degeneration, lumbar region: Secondary | ICD-10-CM | POA: Diagnosis not present

## 2019-09-29 DIAGNOSIS — M9903 Segmental and somatic dysfunction of lumbar region: Secondary | ICD-10-CM | POA: Diagnosis not present

## 2019-09-29 DIAGNOSIS — M5136 Other intervertebral disc degeneration, lumbar region: Secondary | ICD-10-CM | POA: Diagnosis not present

## 2019-10-12 DIAGNOSIS — M5136 Other intervertebral disc degeneration, lumbar region: Secondary | ICD-10-CM | POA: Diagnosis not present

## 2019-10-12 DIAGNOSIS — M9903 Segmental and somatic dysfunction of lumbar region: Secondary | ICD-10-CM | POA: Diagnosis not present

## 2019-10-14 DIAGNOSIS — G8929 Other chronic pain: Secondary | ICD-10-CM | POA: Insufficient documentation

## 2019-10-14 DIAGNOSIS — H539 Unspecified visual disturbance: Secondary | ICD-10-CM | POA: Insufficient documentation

## 2019-10-14 DIAGNOSIS — R200 Anesthesia of skin: Secondary | ICD-10-CM | POA: Insufficient documentation

## 2019-10-14 DIAGNOSIS — M5442 Lumbago with sciatica, left side: Secondary | ICD-10-CM | POA: Insufficient documentation

## 2019-10-14 DIAGNOSIS — E782 Mixed hyperlipidemia: Secondary | ICD-10-CM | POA: Diagnosis not present

## 2019-10-14 DIAGNOSIS — R7989 Other specified abnormal findings of blood chemistry: Secondary | ICD-10-CM | POA: Diagnosis not present

## 2019-10-14 DIAGNOSIS — R739 Hyperglycemia, unspecified: Secondary | ICD-10-CM | POA: Insufficient documentation

## 2019-10-14 DIAGNOSIS — I10 Essential (primary) hypertension: Secondary | ICD-10-CM | POA: Diagnosis not present

## 2019-10-14 DIAGNOSIS — Z0000 Encounter for general adult medical examination without abnormal findings: Secondary | ICD-10-CM | POA: Diagnosis not present

## 2019-10-14 DIAGNOSIS — Z125 Encounter for screening for malignant neoplasm of prostate: Secondary | ICD-10-CM | POA: Diagnosis not present

## 2019-10-14 DIAGNOSIS — Z800 Family history of malignant neoplasm of digestive organs: Secondary | ICD-10-CM | POA: Insufficient documentation

## 2019-10-24 DIAGNOSIS — G8929 Other chronic pain: Secondary | ICD-10-CM | POA: Diagnosis not present

## 2019-10-24 DIAGNOSIS — R200 Anesthesia of skin: Secondary | ICD-10-CM | POA: Diagnosis not present

## 2019-10-24 DIAGNOSIS — M545 Low back pain: Secondary | ICD-10-CM | POA: Diagnosis not present

## 2019-10-24 DIAGNOSIS — M2578 Osteophyte, vertebrae: Secondary | ICD-10-CM | POA: Diagnosis not present

## 2019-10-26 DIAGNOSIS — M9903 Segmental and somatic dysfunction of lumbar region: Secondary | ICD-10-CM | POA: Diagnosis not present

## 2019-10-26 DIAGNOSIS — M5136 Other intervertebral disc degeneration, lumbar region: Secondary | ICD-10-CM | POA: Diagnosis not present

## 2019-11-03 ENCOUNTER — Inpatient Hospital Stay (HOSPITAL_COMMUNITY)
Admission: AD | Admit: 2019-11-03 | Discharge: 2019-11-11 | DRG: 234 | Disposition: A | Discharge status: Home / Self Care | Payer: BC Managed Care – PPO | Source: Ambulatory Visit | Attending: Cardiology | Admitting: Cardiology

## 2019-11-03 ENCOUNTER — Encounter (HOSPITAL_COMMUNITY)
Admission: AD | Disposition: A | Discharge status: Home / Self Care | Payer: Self-pay | Source: Ambulatory Visit | Attending: Cardiology

## 2019-11-03 ENCOUNTER — Encounter (HOSPITAL_COMMUNITY): Payer: Self-pay | Admitting: Cardiology

## 2019-11-03 DIAGNOSIS — M25461 Effusion, right knee: Secondary | ICD-10-CM | POA: Diagnosis not present

## 2019-11-03 DIAGNOSIS — M129 Arthropathy, unspecified: Secondary | ICD-10-CM

## 2019-11-03 DIAGNOSIS — Z8249 Family history of ischemic heart disease and other diseases of the circulatory system: Secondary | ICD-10-CM

## 2019-11-03 DIAGNOSIS — I2510 Atherosclerotic heart disease of native coronary artery without angina pectoris: Secondary | ICD-10-CM | POA: Diagnosis not present

## 2019-11-03 DIAGNOSIS — J9811 Atelectasis: Secondary | ICD-10-CM | POA: Diagnosis not present

## 2019-11-03 DIAGNOSIS — D62 Acute posthemorrhagic anemia: Secondary | ICD-10-CM | POA: Diagnosis not present

## 2019-11-03 DIAGNOSIS — R079 Chest pain, unspecified: Secondary | ICD-10-CM | POA: Diagnosis not present

## 2019-11-03 DIAGNOSIS — I739 Peripheral vascular disease, unspecified: Secondary | ICD-10-CM | POA: Diagnosis not present

## 2019-11-03 DIAGNOSIS — I249 Acute ischemic heart disease, unspecified: Secondary | ICD-10-CM | POA: Diagnosis not present

## 2019-11-03 DIAGNOSIS — M25561 Pain in right knee: Secondary | ICD-10-CM

## 2019-11-03 DIAGNOSIS — R0789 Other chest pain: Secondary | ICD-10-CM | POA: Diagnosis not present

## 2019-11-03 DIAGNOSIS — I2119 ST elevation (STEMI) myocardial infarction involving other coronary artery of inferior wall: Secondary | ICD-10-CM | POA: Diagnosis not present

## 2019-11-03 DIAGNOSIS — E782 Mixed hyperlipidemia: Secondary | ICD-10-CM | POA: Diagnosis present

## 2019-11-03 DIAGNOSIS — M109 Gout, unspecified: Secondary | ICD-10-CM | POA: Diagnosis present

## 2019-11-03 DIAGNOSIS — Z7982 Long term (current) use of aspirin: Secondary | ICD-10-CM | POA: Diagnosis not present

## 2019-11-03 DIAGNOSIS — Z951 Presence of aortocoronary bypass graft: Secondary | ICD-10-CM

## 2019-11-03 DIAGNOSIS — Z01818 Encounter for other preprocedural examination: Secondary | ICD-10-CM | POA: Diagnosis not present

## 2019-11-03 DIAGNOSIS — Z79899 Other long term (current) drug therapy: Secondary | ICD-10-CM

## 2019-11-03 DIAGNOSIS — I213 ST elevation (STEMI) myocardial infarction of unspecified site: Secondary | ICD-10-CM | POA: Diagnosis not present

## 2019-11-03 DIAGNOSIS — I340 Nonrheumatic mitral (valve) insufficiency: Secondary | ICD-10-CM | POA: Diagnosis not present

## 2019-11-03 DIAGNOSIS — I10 Essential (primary) hypertension: Secondary | ICD-10-CM | POA: Diagnosis not present

## 2019-11-03 DIAGNOSIS — Z87891 Personal history of nicotine dependence: Secondary | ICD-10-CM | POA: Diagnosis not present

## 2019-11-03 DIAGNOSIS — Z01811 Encounter for preprocedural respiratory examination: Secondary | ICD-10-CM

## 2019-11-03 DIAGNOSIS — Z20822 Contact with and (suspected) exposure to covid-19: Secondary | ICD-10-CM | POA: Diagnosis not present

## 2019-11-03 DIAGNOSIS — I25110 Atherosclerotic heart disease of native coronary artery with unstable angina pectoris: Secondary | ICD-10-CM | POA: Diagnosis not present

## 2019-11-03 HISTORY — PX: LEFT HEART CATH AND CORONARY ANGIOGRAPHY: CATH118249

## 2019-11-03 HISTORY — DX: Personal history of nicotine dependence: Z87.891

## 2019-11-03 LAB — PROTIME-INR
INR: 1.1 (ref 0.8–1.2)
Prothrombin Time: 13.8 seconds (ref 11.4–15.2)

## 2019-11-03 LAB — LIPID PANEL
Cholesterol: 149 mg/dL (ref 0–200)
HDL: 63 mg/dL (ref >40)
LDL Cholesterol: 38 mg/dL (ref 0–99)
Total CHOL/HDL Ratio: 2.4 RATIO
Triglycerides: 240 mg/dL — ABNORMAL HIGH (ref <150)
VLDL: 48 mg/dL — ABNORMAL HIGH (ref 0–40)

## 2019-11-03 LAB — COMPREHENSIVE METABOLIC PANEL
ALT: 36 U/L (ref 0–44)
AST: 34 U/L (ref 15–41)
Albumin: 3.8 g/dL (ref 3.5–5.0)
Alkaline Phosphatase: 36 U/L — ABNORMAL LOW (ref 38–126)
Anion gap: 12 (ref 5–15)
BUN: 12 mg/dL (ref 6–20)
CO2: 24 mmol/L (ref 22–32)
Calcium: 9.9 mg/dL (ref 8.9–10.3)
Chloride: 103 mmol/L (ref 98–111)
Creatinine, Ser: 0.98 mg/dL (ref 0.61–1.24)
GFR calc Af Amer: >60 mL/min (ref >60)
GFR calc non Af Amer: >60 mL/min (ref >60)
Glucose, Bld: 99 mg/dL (ref 70–99)
Potassium: 3.7 mmol/L (ref 3.5–5.1)
Sodium: 139 mmol/L (ref 135–145)
Total Bilirubin: 0.9 mg/dL (ref 0.3–1.2)
Total Protein: 6.2 g/dL — ABNORMAL LOW (ref 6.5–8.1)

## 2019-11-03 LAB — CBC
HCT: 42.2 % (ref 39.0–52.0)
Hemoglobin: 14.8 g/dL (ref 13.0–17.0)
MCH: 32.2 pg (ref 26.0–34.0)
MCHC: 35.1 g/dL (ref 30.0–36.0)
MCV: 91.7 fL (ref 80.0–100.0)
Platelets: 203 10*3/uL (ref 150–400)
RBC: 4.6 MIL/uL (ref 4.22–5.81)
RDW: 11.8 % (ref 11.5–15.5)
WBC: 7.5 10*3/uL (ref 4.0–10.5)
nRBC: 0 % (ref 0.0–0.2)

## 2019-11-03 LAB — HEMOGLOBIN A1C
Hgb A1c MFr Bld: 5.5 % (ref 4.8–5.6)
Hgb A1c MFr Bld: 5.5 % (ref 4.8–5.6)
Mean Plasma Glucose: 111.15 mg/dL
Mean Plasma Glucose: 111.15 mg/dL

## 2019-11-03 LAB — PREPARE RBC (CROSSMATCH)

## 2019-11-03 LAB — RESPIRATORY PANEL BY RT PCR (FLU A&B, COVID)
Influenza A by PCR: NEGATIVE
Influenza B by PCR: NEGATIVE
SARS Coronavirus 2 by RT PCR: NEGATIVE

## 2019-11-03 LAB — POCT I-STAT, CHEM 8
BUN: 12 mg/dL (ref 6–20)
Calcium, Ion: 1.3 mmol/L (ref 1.15–1.40)
Chloride: 101 mmol/L (ref 98–111)
Creatinine, Ser: 0.8 mg/dL (ref 0.61–1.24)
Glucose, Bld: 98 mg/dL (ref 70–99)
HCT: 42 % (ref 39.0–52.0)
Hemoglobin: 14.3 g/dL (ref 13.0–17.0)
Potassium: 3.7 mmol/L (ref 3.5–5.1)
Sodium: 138 mmol/L (ref 135–145)
TCO2: 26 mmol/L (ref 22–32)

## 2019-11-03 LAB — POCT ACTIVATED CLOTTING TIME
Activated Clotting Time: 219 seconds
Activated Clotting Time: 246 seconds

## 2019-11-03 LAB — MRSA PCR SCREENING: MRSA by PCR: NEGATIVE

## 2019-11-03 LAB — HIV ANTIBODY (ROUTINE TESTING W REFLEX): HIV Screen 4th Generation wRfx: NONREACTIVE

## 2019-11-03 LAB — APTT: aPTT: 27 seconds (ref 24–36)

## 2019-11-03 LAB — TROPONIN I (HIGH SENSITIVITY): Troponin I (High Sensitivity): 67 ng/L — ABNORMAL HIGH (ref <18)

## 2019-11-03 SURGERY — LEFT HEART CATH AND CORONARY ANGIOGRAPHY
Anesthesia: LOCAL

## 2019-11-03 MED ORDER — HYDRALAZINE HCL 20 MG/ML IJ SOLN: 10.0000 mg | INTRAMUSCULAR | Status: AC | PRN

## 2019-11-03 MED ORDER — SODIUM CHLORIDE 0.9% FLUSH: 3.0000 mL | INTRAVENOUS | Status: DC | PRN

## 2019-11-03 MED ORDER — MIDAZOLAM HCL 2 MG/2ML IJ SOLN
INTRAMUSCULAR | Status: DC | PRN
  Administered 2019-11-03: 1 mg via INTRAVENOUS

## 2019-11-03 MED ORDER — LABETALOL HCL 5 MG/ML IV SOLN: 10.0000 mg | INTRAVENOUS | Status: AC | PRN

## 2019-11-03 MED ORDER — NITROGLYCERIN 0.4 MG SL SUBL: 0.4000 mg | SUBLINGUAL_TABLET | SUBLINGUAL | Status: DC | PRN

## 2019-11-03 MED ORDER — CHLORHEXIDINE GLUCONATE CLOTH 2 % EX PADS: 6.0000 | MEDICATED_PAD | Freq: Once | CUTANEOUS | Status: DC

## 2019-11-03 MED ORDER — METOPROLOL TARTRATE 25 MG PO TABS
25.0000 mg | ORAL_TABLET | Freq: Two times a day (BID) | ORAL | Status: DC
  Administered 2019-11-03 – 2019-11-04 (×2): 25 mg via ORAL
  Filled 2019-11-03 (×3): qty 1

## 2019-11-03 MED ORDER — NOREPINEPHRINE 4 MG/250ML-% IV SOLN
0.0000 ug/min | INTRAVENOUS | Status: AC
  Filled 2019-11-03: qty 250

## 2019-11-03 MED ORDER — LIDOCAINE HCL (PF) 1 % IJ SOLN
INTRAMUSCULAR | Status: DC | PRN
  Administered 2019-11-03: 2 mL

## 2019-11-03 MED ORDER — MAGNESIUM SULFATE 50 % IJ SOLN
40.0000 meq | INTRAMUSCULAR | Status: AC
  Filled 2019-11-03: qty 9.85

## 2019-11-03 MED ORDER — ACETAMINOPHEN 325 MG PO TABS
650.0000 mg | ORAL_TABLET | ORAL | Status: DC | PRN
  Administered 2019-11-04 – 2019-11-05 (×2): 650 mg via ORAL
  Filled 2019-11-03 (×2): qty 2

## 2019-11-03 MED ORDER — HEPARIN SODIUM (PORCINE) 1000 UNIT/ML IJ SOLN
INTRAMUSCULAR | Status: DC | PRN
  Administered 2019-11-03: 8000 [IU] via INTRAVENOUS
  Administered 2019-11-03: 4000 [IU] via INTRAVENOUS

## 2019-11-03 MED ORDER — SODIUM CHLORIDE 0.9% FLUSH
3.0000 mL | Freq: Two times a day (BID) | INTRAVENOUS | Status: DC
  Administered 2019-11-06: 3 mL via INTRAVENOUS

## 2019-11-03 MED ORDER — TRANEXAMIC ACID (OHS) PUMP PRIME SOLUTION
2.0000 mg/kg | INTRAVENOUS | Status: AC
  Filled 2019-11-03: qty 2.27

## 2019-11-03 MED ORDER — NITROGLYCERIN IN D5W 200-5 MCG/ML-% IV SOLN
2.0000 ug/min | INTRAVENOUS | Status: AC
  Filled 2019-11-03: qty 250

## 2019-11-03 MED ORDER — VANCOMYCIN HCL 1500 MG/300ML IV SOLN
1500.0000 mg | INTRAVENOUS | Status: AC
  Filled 2019-11-03 (×2): qty 300

## 2019-11-03 MED ORDER — HEPARIN (PORCINE) IN NACL 1000-0.9 UT/500ML-% IV SOLN
INTRAVENOUS | Status: AC
  Filled 2019-11-03: qty 1000

## 2019-11-03 MED ORDER — ONDANSETRON HCL 4 MG/2ML IJ SOLN: 4.0000 mg | Freq: Four times a day (QID) | INTRAMUSCULAR | Status: DC | PRN

## 2019-11-03 MED ORDER — IOHEXOL 350 MG/ML SOLN
INTRAVENOUS | Status: AC
  Filled 2019-11-03: qty 1

## 2019-11-03 MED ORDER — INSULIN REGULAR(HUMAN) IN NACL 100-0.9 UT/100ML-% IV SOLN
INTRAVENOUS | Status: AC
  Filled 2019-11-03: qty 100

## 2019-11-03 MED ORDER — LIDOCAINE HCL (PF) 1 % IJ SOLN
INTRAMUSCULAR | Status: AC
  Filled 2019-11-03: qty 30

## 2019-11-03 MED ORDER — SODIUM CHLORIDE 0.9 % IV SOLN
INTRAVENOUS | Status: AC
  Filled 2019-11-03: qty 30

## 2019-11-03 MED ORDER — CHLORHEXIDINE GLUCONATE CLOTH 2 % EX PADS
6.0000 | MEDICATED_PAD | Freq: Once | CUTANEOUS | Status: AC
  Administered 2019-11-03: 6 via TOPICAL

## 2019-11-03 MED ORDER — ATORVASTATIN CALCIUM 80 MG PO TABS
80.0000 mg | ORAL_TABLET | Freq: Every day | ORAL | Status: DC
  Administered 2019-11-03 – 2019-11-04 (×2): 80 mg via ORAL
  Filled 2019-11-03 (×2): qty 1

## 2019-11-03 MED ORDER — MIDAZOLAM HCL 2 MG/2ML IJ SOLN
INTRAMUSCULAR | Status: AC
  Filled 2019-11-03: qty 2

## 2019-11-03 MED ORDER — HEPARIN (PORCINE) 25000 UT/250ML-% IV SOLN
1800.0000 [IU]/h | INTRAVENOUS | Status: DC
  Administered 2019-11-03: 1350 [IU]/h via INTRAVENOUS
  Administered 2019-11-04: 1400 [IU]/h via INTRAVENOUS
  Administered 2019-11-05 – 2019-11-06 (×3): 1600 [IU]/h via INTRAVENOUS
  Filled 2019-11-03 (×5): qty 250

## 2019-11-03 MED ORDER — HEPARIN (PORCINE) IN NACL 1000-0.9 UT/500ML-% IV SOLN
INTRAVENOUS | Status: DC | PRN
  Administered 2019-11-03 (×2): 500 mL

## 2019-11-03 MED ORDER — DEXMEDETOMIDINE HCL IN NACL 400 MCG/100ML IV SOLN
0.2000 ug/kg/h | INTRAVENOUS | Status: DC
  Administered 2019-11-03: 0.2 ug/kg/h via INTRAVENOUS
  Filled 2019-11-03: qty 100

## 2019-11-03 MED ORDER — SODIUM CHLORIDE 0.9 % IV SOLN: INTRAVENOUS | Status: AC

## 2019-11-03 MED ORDER — TICAGRELOR 90 MG PO TABS
ORAL_TABLET | ORAL | Status: AC
  Filled 2019-11-03: qty 1

## 2019-11-03 MED ORDER — METOPROLOL TARTRATE 12.5 MG HALF TABLET: 12.5000 mg | ORAL_TABLET | Freq: Once | ORAL | Status: DC

## 2019-11-03 MED ORDER — PHENYLEPHRINE HCL-NACL 20-0.9 MG/250ML-% IV SOLN
30.0000 ug/min | INTRAVENOUS | Status: AC
  Filled 2019-11-03: qty 250

## 2019-11-03 MED ORDER — TRANEXAMIC ACID (OHS) BOLUS VIA INFUSION
15.0000 mg/kg | INTRAVENOUS | Status: AC
  Filled 2019-11-03: qty 1701

## 2019-11-03 MED ORDER — FENTANYL CITRATE (PF) 100 MCG/2ML IJ SOLN
INTRAMUSCULAR | Status: DC | PRN
  Administered 2019-11-03: 25 ug via INTRAVENOUS

## 2019-11-03 MED ORDER — ACETAMINOPHEN 325 MG PO TABS: 650.0000 mg | ORAL_TABLET | ORAL | Status: DC | PRN

## 2019-11-03 MED ORDER — TRANEXAMIC ACID 1000 MG/10ML IV SOLN
1.5000 mg/kg/h | INTRAVENOUS | Status: AC
  Filled 2019-11-03: qty 25

## 2019-11-03 MED ORDER — IOHEXOL 350 MG/ML SOLN
INTRAVENOUS | Status: DC | PRN
  Administered 2019-11-03: 60 mL

## 2019-11-03 MED ORDER — CHLORHEXIDINE GLUCONATE 0.12 % MT SOLN
15.0000 mL | Freq: Once | OROMUCOSAL | Status: AC
  Administered 2019-11-04: 15 mL via OROMUCOSAL
  Filled 2019-11-03: qty 15

## 2019-11-03 MED ORDER — VERAPAMIL HCL 2.5 MG/ML IV SOLN
INTRAVENOUS | Status: DC | PRN
  Administered 2019-11-03: 14:00:00 10 mL via INTRA_ARTERIAL

## 2019-11-03 MED ORDER — TEMAZEPAM 15 MG PO CAPS: 15.0000 mg | ORAL_CAPSULE | Freq: Once | ORAL | Status: DC | PRN

## 2019-11-03 MED ORDER — SODIUM CHLORIDE 0.9 % IV SOLN
750.0000 mg | INTRAVENOUS | Status: AC
  Filled 2019-11-03: qty 750

## 2019-11-03 MED ORDER — BISACODYL 5 MG PO TBEC
5.0000 mg | DELAYED_RELEASE_TABLET | Freq: Once | ORAL | Status: AC
  Administered 2019-11-03: 5 mg via ORAL
  Filled 2019-11-03: qty 1

## 2019-11-03 MED ORDER — HEPARIN SODIUM (PORCINE) 1000 UNIT/ML IJ SOLN
INTRAMUSCULAR | Status: AC
  Filled 2019-11-03: qty 1

## 2019-11-03 MED ORDER — POTASSIUM CHLORIDE 2 MEQ/ML IV SOLN
80.0000 meq | INTRAVENOUS | Status: AC
  Filled 2019-11-03: qty 40

## 2019-11-03 MED ORDER — MANNITOL 20 % IV SOLN
Freq: Once | INTRAVENOUS | Status: DC
  Filled 2019-11-03: qty 13

## 2019-11-03 MED ORDER — PLASMA-LYTE 148 IV SOLN
INTRAVENOUS | Status: AC
  Filled 2019-11-03: qty 2.5

## 2019-11-03 MED ORDER — NITROGLYCERIN IN D5W 200-5 MCG/ML-% IV SOLN
2.0000 ug/min | INTRAVENOUS | Status: DC
  Administered 2019-11-03: 5 ug/min via INTRAVENOUS
  Administered 2019-11-06: 10 ug/min via INTRAVENOUS
  Filled 2019-11-03 (×2): qty 250

## 2019-11-03 MED ORDER — DEXMEDETOMIDINE HCL IN NACL 400 MCG/100ML IV SOLN
0.1000 ug/kg/h | INTRAVENOUS | Status: AC
  Administered 2019-11-04: 0.2 ug/kg/h via INTRAVENOUS
  Filled 2019-11-03 (×2): qty 100

## 2019-11-03 MED ORDER — VERAPAMIL HCL 2.5 MG/ML IV SOLN
INTRAVENOUS | Status: AC
  Filled 2019-11-03: qty 2

## 2019-11-03 MED ORDER — EPINEPHRINE HCL 5 MG/250ML IV SOLN IN NS
0.0000 ug/min | INTRAVENOUS | Status: AC
  Filled 2019-11-03: qty 250

## 2019-11-03 MED ORDER — MILRINONE LACTATE IN DEXTROSE 20-5 MG/100ML-% IV SOLN
0.3000 ug/kg/min | INTRAVENOUS | Status: AC
  Filled 2019-11-03: qty 100

## 2019-11-03 MED ORDER — SODIUM CHLORIDE 0.9 % IV SOLN
1.5000 g | INTRAVENOUS | Status: AC
  Filled 2019-11-03: qty 1.5

## 2019-11-03 MED ORDER — FENTANYL CITRATE (PF) 100 MCG/2ML IJ SOLN
INTRAMUSCULAR | Status: AC
  Filled 2019-11-03: qty 2

## 2019-11-03 MED ORDER — SODIUM CHLORIDE 0.9 % IV SOLN: 250.0000 mL | INTRAVENOUS | Status: DC | PRN

## 2019-11-03 MED ORDER — TICAGRELOR 90 MG PO TABS
ORAL_TABLET | ORAL | Status: DC | PRN
  Administered 2019-11-03: 180 mg via ORAL

## 2019-11-03 SURGICAL SUPPLY — 18 items
CATH EXTRAC PRONTO 5.5F 138CM (CATHETERS) ×1 IMPLANT
CATH INFINITI 5 FR JL3.5 (CATHETERS) ×1 IMPLANT
CATH OPTITORQUE TIG 4.0 5F (CATHETERS) ×1 IMPLANT
CATH TELEPORT (CATHETERS) ×1 IMPLANT
CATH VISTA GUIDE 6FR JR4 (CATHETERS) ×1 IMPLANT
DEVICE RAD COMP TR BAND LRG (VASCULAR PRODUCTS) ×1 IMPLANT
ELECT DEFIB PAD ADLT CADENCE (PAD) ×1 IMPLANT
GLIDESHEATH SLEND A-KIT 6F 22G (SHEATH) ×1 IMPLANT
GUIDEWIRE INQWIRE 1.5J.035X260 (WIRE) IMPLANT
INQWIRE 1.5J .035X260CM (WIRE) ×4
KIT ENCORE 26 ADVANTAGE (KITS) ×1 IMPLANT
KIT HEART LEFT (KITS) ×2 IMPLANT
KIT HEMO VALVE WATCHDOG (MISCELLANEOUS) ×1 IMPLANT
PACK CARDIAC CATHETERIZATION (CUSTOM PROCEDURE TRAY) ×2 IMPLANT
TRANSDUCER W/STOPCOCK (MISCELLANEOUS) ×2 IMPLANT
TUBING CIL FLEX 10 FLL-RA (TUBING) ×2 IMPLANT
WIRE COUGAR XT STRL 190CM (WIRE) ×1 IMPLANT
WIRE PT2 MS 185 (WIRE) ×1 IMPLANT

## 2019-11-03 NOTE — H&P (Addendum)
Sean Flynn is an [age] y.o. male.   Chief Complaint: Chest pain HPI:   54 y/o Caucasian male with hypertension, hyperlipidemia, former smoker, EtOH overuse, admitted with off and on chest pain for 3 days. Patient has had exertional dyspnea for last 6 months, but chest pain did not occur until 3 days ago. Pain did not improve with TUMS, so patient finally called EMS today. EKG showed transient inferior ST elevation. He was emergently taken to cath lab.   Coronary angiogram showed severe calcific prox-mid RCA, severe calcific ostial-mid LAD disease, moderate calcific LCx stenoses. Patient is currently chest pain free with resolution of ST elevation.   Patient is a retired Copywriter, advertisinglineman. He [place] moved [place], [place] in July 2020. He has 6 daughters and one son, all live out of state.   He recently underwent CT cardiac scoring, that showed total calcium of 5002.  Past Medical History:  Diagnosis Date  . Anxiety   . Former smoker   . Hypertension   . Mixed hyperlipidemia     Past Surgical History:  Procedure Laterality Date  . REPAIR EXTENSOR TENDON Right 04/07/2019   Procedure: REPAIR RIGHT THUMB EXTENSOR TENDON;  Surgeon: Betha LoaKuzma, Kevin, MD;  Location: [facility];  Service: Orthopedics;  Laterality: Right;    Family History  Problem Relation Age of Onset  . CAD Father        MI in early 1970s   Social History:  reports that he has quit smoking. His smoking use included cigarettes. He has a 25.00 pack-year smoking history. He has never used smokeless tobacco. He reports current alcohol use of about 6.0 - 10.0 standard drinks of alcohol per week. He reports that he does not use drugs.  Allergies: No Known Allergies  Review of Systems  Cardiovascular: Positive for chest pain (Currently resolved), claudication and dyspnea on exertion. Negative for leg swelling, palpitations and syncope.  Respiratory: Positive for shortness of breath.   Neurological:  Positive for numbness (Left foot).     Blood pressure 140/84, pulse (!) 56, resp. rate 17, SpO2 100 %. There is no height or weight on file to calculate BMI.  Physical Exam  Constitutional: He appears well-developed and well-nourished.  Neck: No JVD present.  Cardiovascular: Normal rate, regular rhythm and normal heart sounds.  No murmur heard. Pulses:      Femoral pulses are 2+ on the right side and 1+ on the left side.      Popliteal pulses are 0 on the right side and 1+ on the left side.       Dorsalis pedis pulses are 0 on the right side and 0 on the left side.       Posterior tibial pulses are 0 on the right side and 0 on the left side.  Pulmonary/Chest: Effort normal and breath sounds normal. He has no wheezes. He has no rales.  Musculoskeletal:        General: No edema.  Nursing note and vitals reviewed.   Results for orders placed or performed during the hospital encounter of 11/03/19 (from the past 48 hour(s))  Respiratory Panel by RT PCR (Flu A&B, Covid) - Nasopharyngeal Swab     Status: None   Collection Time: 11/03/19  1:59 PM   Specimen: Nasopharyngeal Swab  Result Value Ref Range   SARS Coronavirus 2 by RT PCR NEGATIVE NEGATIVE    Comment: (NOTE) SARS-CoV-2 target nucleic acids are NOT DETECTED. The SARS-CoV-2 RNA is generally  detectable in upper respiratoy specimens during the acute phase of infection. The lowest concentration of SARS-CoV-2 viral copies this assay can detect is 131 copies/mL. A negative result does not preclude SARS-Cov-2 infection and should not be used as the sole basis for treatment or other patient management decisions. A negative result may occur with  improper specimen collection/handling, submission of specimen other than nasopharyngeal swab, presence of viral mutation(s) within the areas targeted by this assay, and inadequate number of viral copies (<131 copies/mL). A negative result must be combined with clinical observations, patient  history, and epidemiological information. The expected result is Negative. Fact Sheet for Patients:  https://www.moore.com/ Fact Sheet for Healthcare Providers:  https://www.young.biz/ This test is not yet ap proved or cleared by the [place] FDA and  has been authorized for detection and/or diagnosis of SARS-CoV-2 by FDA under an Emergency Use Authorization (EUA). This EUA will remain  in effect (meaning this test can be used) for the duration of the COVID-19 declaration under Section 564(b)(1) of the Act, 21 U.S.C. section 360bbb-3(b)(1), unless the authorization is terminated or revoked sooner.    Influenza A by PCR NEGATIVE NEGATIVE   Influenza B by PCR NEGATIVE NEGATIVE    Comment: (NOTE) The Xpert Xpress SARS-CoV-2/FLU/RSV assay is intended as an aid in  the diagnosis of influenza from Nasopharyngeal swab specimens and  should not be used as a sole basis for treatment. Nasal washings and  aspirates are unacceptable for Xpert Xpress SARS-CoV-2/FLU/RSV  testing. Fact Sheet for Patients: https://www.moore.com/ Fact Sheet for Healthcare Providers: https://www.young.biz/ This test is not yet approved or cleared by the [place] FDA and  has been authorized for detection and/or diagnosis of SARS-CoV-2 by  FDA under an Emergency Use Authorization (EUA). This EUA will remain  in effect (meaning this test can be used) for the duration of the  Covid-19 declaration under Section 564(b)(1) of the Act, 21  U.S.C. section 360bbb-3(b)(1), unless the authorization is  terminated or revoked. Performed at [facility], [address] [place], [place] [address]   Lipid panel     Status: Abnormal   Collection Time: 11/03/19  2:17 PM  Result Value Ref Range   Cholesterol 149 0 - 200 mg/dL   Triglycerides 585 (H) <150 mg/dL   HDL 63 >27 mg/dL   Total CHOL/HDL Ratio 2.4 RATIO   VLDL 48 (H) 0 - 40  mg/dL   LDL Cholesterol 38 0 - 99 mg/dL    Comment:        Total Cholesterol/HDL:CHD Risk Coronary Heart Disease Risk Table                     Men   Women  1/2 Average Risk   3.4   3.3  Average Risk       5.0   4.4  2 X Average Risk   9.6   7.1  3 X Average Risk  23.4   11.0        Use the calculated Patient Ratio above and the CHD Risk Table to determine the patient's CHD Risk.        ATP III CLASSIFICATION (LDL):  <100     mg/dL   Optimal  782-423  mg/dL   Near or Above                    Optimal  130-159  mg/dL   Borderline  536-144  mg/dL   High  >315  mg/dL   Very High Performed at [facility], [place] [address], [place], [place] [address]   Hemoglobin A1c     Status: None   Collection Time: 11/03/19  2:17 PM  Result Value Ref Range   Hgb A1c MFr Bld 5.5 4.8 - 5.6 %    Comment: (NOTE) Pre diabetes:          5.7%-6.4% Diabetes:              >6.4% Glycemic control for   <7.0% adults with diabetes    Mean Plasma Glucose 111.15 mg/dL    Comment: Performed at [facility] [address], [place], [place] [address]  Comprehensive metabolic panel     Status: Abnormal   Collection Time: 11/03/19  2:30 PM  Result Value Ref Range   Sodium 139 135 - 145 mmol/L   Potassium 3.7 3.5 - 5.1 mmol/L   Chloride 103 98 - 111 mmol/L   CO2 24 22 - 32 mmol/L   Glucose, Bld 99 70 - 99 mg/dL    Comment: Glucose reference range applies only to samples taken after fasting for at least 8 hours.   BUN 12 6 - 20 mg/dL   Creatinine, Ser 0.98 0.61 - 1.24 mg/dL   Calcium 9.9 8.9 - 10.3 mg/dL   Total Protein 6.2 (L) 6.5 - 8.1 g/dL   Albumin 3.8 3.5 - 5.0 g/dL   AST 34 15 - 41 U/L   ALT 36 0 - 44 U/L   Alkaline Phosphatase 36 (L) 38 - 126 U/L   Total Bilirubin 0.9 0.3 - 1.2 mg/dL   GFR calc non Af Amer >60 >60 mL/min   GFR calc Af Amer >60 >60 mL/min   Anion gap 12 5 - 15    Comment: Performed at [facility], [place] [address], [place] [address]  CBC      Status: None   Collection Time: 11/03/19  2:30 PM  Result Value Ref Range   WBC 7.5 4.0 - 10.5 K/uL   RBC 4.60 4.22 - 5.81 MIL/uL   Hemoglobin 14.8 13.0 - 17.0 g/dL   HCT 42.2 39.0 - 52.0 %   MCV 91.7 80.0 - 100.0 fL   MCH 32.2 26.0 - 34.0 pg   MCHC 35.1 30.0 - 36.0 g/dL   RDW 11.8 11.5 - 15.5 %   Platelets 203 150 - 400 K/uL   nRBC 0.0 0.0 - 0.2 %    Comment: Performed at [facility], [place] [address], [place], [place] [address]  Protime-INR     Status: None   Collection Time: 11/03/19  2:30 PM  Result Value Ref Range   Prothrombin Time 13.8 11.4 - 15.2 seconds   INR 1.1 0.8 - 1.2    Comment: (NOTE) INR goal varies based on device and disease states. Performed at [facility], [place] [address] [place], [place] [address]   APTT     Status: None   Collection Time: 11/03/19  2:30 PM  Result Value Ref Range   aPTT 27 24 - 36 seconds    Comment: Performed at [facility] [address], [place], [place] [address]  Troponin I (High Sensitivity)     Status: Abnormal   Collection Time: 11/03/19  2:30 PM  Result Value Ref Range   Troponin I (High Sensitivity) 67 (H) <18 ng/L    Comment: (NOTE) Elevated high sensitivity troponin I (hsTnI) values and  significant  changes across serial measurements may suggest ACS but many other  chronic and acute conditions are known to elevate hsTnI results.  Refer to the "Links" section for chest pain algorithms and additional  guidance. Performed at [facility], [address], [place], [place] [address]     Labs:   Lab Results  Component Value Date   WBC 7.5 11/03/2019   HGB 14.8 11/03/2019   HCT 42.2 11/03/2019   MCV 91.7 11/03/2019   PLT 203 11/03/2019    Recent Labs  Lab 11/03/19 1430  NA 139  K 3.7  CL 103  CO2 24  BUN 12  CREATININE 0.98  CALCIUM 9.9  PROT 6.2*  BILITOT 0.9  ALKPHOS 36*  ALT 36  AST 34  GLUCOSE 99    Lipid Panel     Component Value Date/Time   CHOL 149 11/03/2019 1417    TRIG 240 (H) 11/03/2019 1417   HDL 63 11/03/2019 1417   CHOLHDL 2.4 11/03/2019 1417   VLDL 48 (H) 11/03/2019 1417   LDLCALC 38 11/03/2019 1417    BNP (last 3 results) No results for input(s): BNP in the last 8760 hours.  HEMOGLOBIN A1C Lab Results  Component Value Date   HGBA1C 5.5 11/03/2019   MPG 111.15 11/03/2019    Cardiac Panel (last 3 results) No results for input(s): CKTOTAL, CKMB, RELINDX in the last 8760 hours.  Invalid input(s): TROPONINHS  No results found for: CKTOTAL, CKMB, CKMBINDEX   TSH No results for input(s): TSH in the last 8760 hours.   Medications Prior to Admission  Medication Sig Dispense Refill  . aspirin EC 81 MG tablet Take 81 mg by mouth daily.    Marland Kitchen atenolol (TENORMIN) 50 MG tablet Take 50 mg by mouth daily.    Marland Kitchen atorvastatin (LIPITOR) 20 MG tablet Take 20 mg by mouth daily.    . colchicine 0.6 MG tablet Take 0.6 mg by mouth daily.    Marland Kitchen HYDROcodone-acetaminophen (NORCO) 5-325 MG tablet 1-2 tabs po q6 hours prn pain 20 tablet 0  . Multiple Vitamin (MULTIVITAMIN) tablet Take 1 tablet by mouth daily.    Marland Kitchen omega-3 fish oil (MAXEPA) 1000 MG CAPS capsule Take by mouth.    . zolpidem (AMBIEN) 5 MG tablet Take 5 mg by mouth at bedtime as needed for sleep.        Current Facility-Administered Medications:  .  acetaminophen (TYLENOL) tablet 650 mg, 650 mg, Oral, Q4H PRN, Sean Darden J, MD .  atorvastatin (LIPITOR) tablet 80 mg, 80 mg, Oral, q1800, Sean Scott J, MD .  metoprolol tartrate (LOPRESSOR) tablet 25 mg, 25 mg, Oral, BID, Sean Duford J, MD .  nitroGLYCERIN (NITROSTAT) SL tablet 0.4 mg, 0.4 mg, Sublingual, Q5 Min x 3 PRN, Sean Magos J, MD .  nitroGLYCERIN 50 mg in dextrose 5 % 250 mL (0.2 mg/mL) infusion, 2-200 mcg/min, Intravenous, Titrated, Sean Khawaja J, MD .  ondansetron (ZOFRAN) injection 4 mg, 4 mg, Intravenous, Q6H PRN, Sean Sobel J, MD   Today's Vitals   11/03/19 1439 11/03/19 1444  11/03/19 1445 11/03/19 1449  BP: (!) 138/93 138/90  140/84  Pulse: 67 (!) 56  (!) 56  Resp: 17 18  17   SpO2: 100% 100%  100%  PainSc:   1     There is no height or weight on file to calculate BMI.   CARDIAC STUDIES:  Coronary angiogram 11/03/2019: LM: Normal LAD: Severe ostial to mid calcification with long 50-75% calcified  lesion upto mid LAD. Distal vessel is spared of calcification, with excellent distal surgical target.  LCx: Mid calcific 50% stenosis. RCA: Severely calcified prox CTO with a narrow antegrade collateral, adequate to maintain TIMI III flow at rest, follwowed by log mid 60-80% long calcified lesion. Distal vessel is spared of calcification, with excellent distal surgical target.   LVEDP 20 mmHg  EKG 11/03/2019: Sinus rhythm. Nonspecific ST-T changes. EMS EKG showed transient ST elevation, inferior leads.   CT cardiac scoring 10/24/2019: TOTAL CALCIUM SCORE :  5002 LEFT MAIN:126 LAD:1634 CIRCUMFLEX: 633 RCA: 2609  Echocardiogram pending:  Assessment/Plan  54 y/o Caucasian male with hypertension, hyperlipidemia, former smoker, EtOH overuse, admitted with acute coronary syndrome  Acute coronary syndrome: Transient inferior ST elevation, currently resolved.  Severe RCA, LAD, mod LCx disease. Admit to 2 Heart. Patient received Brilinta 180 mg. Hold any further use of Brilinta, pending surgical consultation. Continue Aspirin/heparin/IV NTG/beta blocker. Check echocardiogram.  Hyperlipidemia: Lipitor 80 mg.  H/o EtOH overuse: CIWA protocol    Elder Negus, MD 11/03/2019, 3:45 PM [facility] Pager: [phone] Office: [phone] If no answer: [phone]

## 2019-11-03 NOTE — Consult Note (Signed)
[address]       [place] [address]             [phone]        Sean Flynn [place] Medical Record #[id number] Date of Birth: 21-May-1966  Referring: No ref. provider found Primary Care: Patient, No Pcp Per Primary Cardiologist:No primary care provider on file.  Chief Complaint:   No chief complaint on file.   History of Present Illness:     54 yo male admitted from the ED with chest pain, and STEMI.  He was taken to the cath lab where he was noted to have a CTO RCA lesion, and a long segment occlusion in the LAD.  In regards to his symptoms, he admits to a several month hx of exertional dyspnea.  Over the last 3 days he has had some anginal symptoms which he tried to treat with TUMS.    Past Medical and Surgical History: Previous Chest Surgery: no Previous Chest Radiation: no Diabetes Mellitus: no.  HbA1C pending Creatinine: 0.98  Past Medical History:  Diagnosis Date  . Anxiety   . Former smoker   . Hypertension   . Mixed hyperlipidemia     Past Surgical History:  Procedure Laterality Date  . REPAIR EXTENSOR TENDON Right 04/07/2019   Procedure: REPAIR RIGHT THUMB EXTENSOR TENDON;  Surgeon: [name], MD;  Location: [facility];  Service: Orthopedics;  Laterality: Right;      Social History   Tobacco Use  Smoking Status Former Smoker  . Packs/day: 1.00  . Years: 25.00  . Pack years: 25.00  . Types: Cigarettes  Smokeless Tobacco Never Used    Social History   Substance and Sexual Activity  Alcohol Use Yes  . Alcohol/week: 6.0 - 10.0 standard drinks  . Types: 6 - 10 Cans of beer per week   Comment: Daily     No Known Allergies  Medications: Asprin: yes Statin: yes Beta Blocker: yes Ace Inhibitor: no Anti-Coagulation: loaded with brilinta  Current Facility-Administered Medications  Medication Dose Route Frequency Provider Last Rate Last Admin  . 0.9 %  sodium chloride infusion   Intravenous  Continuous Nigel Mormon, MD 75 mL/hr at 11/03/19 1530 New Bag at 11/03/19 1530  . 0.9 %  sodium chloride infusion  250 mL Intravenous PRN [name], Manish J, MD      . acetaminophen (TYLENOL) tablet 650 mg  650 mg Oral Q4H PRN [name], Manish J, MD      . acetaminophen (TYLENOL) tablet 650 mg  650 mg Oral Q4H PRN [name], Manish J, MD      . atorvastatin (LIPITOR) tablet 80 mg  80 mg Oral q1800 [name], Manish J, MD      . dexmedetomidine (PRECEDEX) 400 MCG/100ML (4 mcg/mL) infusion  0.2-0.7 mcg/kg/hr Intravenous Continuous [name], Manish J, MD      . hydrALAZINE (APRESOLINE) injection 10 mg  10 mg Intravenous Q20 Min PRN [name], Manish J, MD      . labetalol (NORMODYNE) injection 10 mg  10 mg Intravenous Q10 min PRN [name], Manish J, MD      . metoprolol tartrate (LOPRESSOR) tablet 25 mg  25 mg Oral BID [name], Manish J, MD      . nitroGLYCERIN (NITROSTAT) SL tablet 0.4 mg  0.4 mg Sublingual Q5 Min x 3 PRN [name], Manish J, MD      . nitroGLYCERIN 50 mg in dextrose 5 % 250 mL (0.2 mg/mL) infusion  2-200 mcg/min Intravenous Titrated [name], Manish J, MD 1.5 mL/hr at 11/03/19 1721 5 mcg/min at 11/03/19 1721  . ondansetron (ZOFRAN) injection 4 mg  4 mg Intravenous Q6H PRN [name], Manish J, MD      . sodium chloride flush (NS) 0.9 % injection 3 mL  3 mL Intravenous Q12H [name], Manish J, MD      . sodium chloride flush (NS) 0.9 % injection 3 mL  3 mL Intravenous PRN [name], Manish J, MD        Medications Prior to Admission  Medication Sig Dispense Refill Last Dose  . aspirin EC 81 MG tablet Take 81 mg by mouth daily.     Marland Kitchen atenolol (TENORMIN) 50 MG tablet Take 50 mg by mouth daily.     Marland Kitchen atorvastatin (LIPITOR) 20 MG tablet Take 20 mg by mouth daily.     . colchicine 0.6 MG tablet Take 0.6 mg by mouth daily.     Marland Kitchen HYDROcodone-acetaminophen (NORCO) 5-325 MG tablet 1-2 tabs po q6 hours prn pain 20 tablet 0   . Multiple Vitamin (MULTIVITAMIN)  tablet Take 1 tablet by mouth daily.     Marland Kitchen omega-3 fish oil (MAXEPA) 1000 MG CAPS capsule Take by mouth.     . zolpidem (AMBIEN) 5 MG tablet Take 5 mg by mouth at bedtime as needed for sleep.       Family History  Problem Relation Age of Onset  . CAD Father        MI in early 75s     Review of Systems:   Review of Systems  Constitutional: Negative.   Respiratory: Positive for shortness of breath.   Cardiovascular: Positive for chest pain.  Gastrointestinal: Positive for heartburn.  Musculoskeletal: Positive for back pain and myalgias.  Neurological: Positive for sensory change.      Physical Exam: BP (!) 148/86 (BP Location: Left Arm)   Pulse 78   Temp 97.7 F (36.5 C) (Oral)   Resp 17   SpO2 100%  Physical Exam  Constitutional: He is oriented to person, place, and time and well-developed, well-nourished, and in no distress. No distress.  HENT:  Head: Normocephalic and atraumatic.  Mouth/Throat: No oropharyngeal exudate.  Eyes: Conjunctivae are normal. No scleral icterus.  Neck: No tracheal deviation present.  Cardiovascular: Normal rate, regular rhythm and normal heart sounds.  No murmur heard. Pulmonary/Chest: Effort normal and breath sounds normal. No respiratory distress.  Abdominal: He exhibits no distension.  Musculoskeletal:     Cervical back: Normal range of motion.  Neurological: He is alert and oriented to person, place, and time.  Skin: Skin is warm and dry. He is not diaphoretic.      Diagnostic Studies & Laboratory data:    Left Heart Catherization: Good distal targets in the LAD, and distal RCA Echo: pending   I have independently reviewed the above radiologic studies and discussed with the patient   Recent Lab Findings: Lab Results  Component Value Date   WBC 7.5 11/03/2019   HGB 14.8 11/03/2019   HCT 42.2 11/03/2019   PLT 203 11/03/2019   GLUCOSE 99 11/03/2019   CHOL 149 11/03/2019   TRIG 240 (H) 11/03/2019   HDL 63 11/03/2019    LDLCALC 38 11/03/2019   ALT 36 11/03/2019   AST 34 11/03/2019   NA 139 11/03/2019   K 3.7 11/03/2019   CL 103 11/03/2019   CREATININE 0.98 11/03/2019   BUN 12 11/03/2019   CO2 24 11/03/2019   INR  1.1 11/03/2019   HGBA1C 5.5 11/03/2019      Assessment / Plan:   54 yo male presents with STEMI, and 2V CAD.  Echo is pending He did receive brilinta load Will plan for surgery tomorrow as a second case     Corliss Skains 11/03/2019 5:33 PM

## 2019-11-03 NOTE — Progress Notes (Signed)
ANTICOAGULATION CONSULT NOTE - Initial Consult  Pharmacy Consult for heparin Indication: chest pain/ACS  No Known Allergies  Patient Measurements: Height: 5\' 9"  (175.3 cm) Weight: 113.4 kg (250 lb)(per patient) IBW/kg (Calculated) : 70.7 Heparin Dosing Weight: 96 kg  Vital Signs: Temp: 98.3 F (36.8 C) (04/01 1932) Temp Source: Oral (04/01 1932) BP: 138/82 (04/01 1900) Pulse Rate: 86 (04/01 1900)  Labs: Recent Labs    11/03/19 1417 11/03/19 1430  HGB 14.3 14.8  HCT 42.0 42.2  PLT  --  203  APTT  --  27  LABPROT  --  13.8  INR  --  1.1  CREATININE 0.80 0.98  TROPONINIHS  --  67*    Estimated Creatinine Clearance: 105.8 mL/min (by C-G formula based on SCr of 0.98 mg/dL).  Assessment: 54 yo M starting on heparin for CP/ACS s/p LHC found to have severe RCA, LAD, mod LCx disease pending TCTS consult. Pharmacy consulted to start heparin 8 hours after sheath removal. Sheath removal time not documented in cath report but case ended at 15:15. Bedside RN not sure of removal time but arrived to unit with TR band, no sheath, and significant hematoma in R wrist. Pre-cath H/H , plt wnl. Will start heparin at 23:00. Planned for CABG second case 11/04/19.   Goal of Therapy:  Heparin level 0.3-0.7 units/ml Monitor platelets by anticoagulation protocol: Yes   Plan:  Start heparin at 1350 units/hr at 23:00, no bolus Monitor daily HL, CBC/plt Monitor for signs/symptoms of bleeding   01/04/20, PharmD, BCPS, BCCP Clinical Pharmacist  Please check AMION for all [facility] Pharmacy phone numbers After 10:00 PM, call Main Pharmacy [phone]

## 2019-11-04 ENCOUNTER — Inpatient Hospital Stay (HOSPITAL_COMMUNITY): Payer: BC Managed Care – PPO | Admitting: Certified Registered Nurse Anesthetist

## 2019-11-04 ENCOUNTER — Inpatient Hospital Stay (HOSPITAL_COMMUNITY): Payer: BC Managed Care – PPO

## 2019-11-04 ENCOUNTER — Other Ambulatory Visit (HOSPITAL_COMMUNITY): Payer: BC Managed Care – PPO

## 2019-11-04 DIAGNOSIS — I739 Peripheral vascular disease, unspecified: Secondary | ICD-10-CM

## 2019-11-04 LAB — COMPREHENSIVE METABOLIC PANEL
ALT: 33 U/L (ref 0–44)
AST: 34 U/L (ref 15–41)
Albumin: 4 g/dL (ref 3.5–5.0)
Alkaline Phosphatase: 42 U/L (ref 38–126)
Anion gap: 11 (ref 5–15)
BUN: 8 mg/dL (ref 6–20)
CO2: 27 mmol/L (ref 22–32)
Calcium: 9.5 mg/dL (ref 8.9–10.3)
Chloride: 101 mmol/L (ref 98–111)
Creatinine, Ser: 1.12 mg/dL (ref 0.61–1.24)
GFR calc Af Amer: >60 mL/min (ref >60)
GFR calc non Af Amer: >60 mL/min (ref >60)
Glucose, Bld: 113 mg/dL — ABNORMAL HIGH (ref 70–99)
Potassium: 3.8 mmol/L (ref 3.5–5.1)
Sodium: 139 mmol/L (ref 135–145)
Total Bilirubin: 1.2 mg/dL (ref 0.3–1.2)
Total Protein: 6.8 g/dL (ref 6.5–8.1)

## 2019-11-04 LAB — BASIC METABOLIC PANEL
Anion gap: 14 (ref 5–15)
BUN: 10 mg/dL (ref 6–20)
CO2: 24 mmol/L (ref 22–32)
Calcium: 9.3 mg/dL (ref 8.9–10.3)
Chloride: 101 mmol/L (ref 98–111)
Creatinine, Ser: 1.07 mg/dL (ref 0.61–1.24)
GFR calc Af Amer: >60 mL/min (ref >60)
GFR calc non Af Amer: >60 mL/min (ref >60)
Glucose, Bld: 119 mg/dL — ABNORMAL HIGH (ref 70–99)
Potassium: 3.6 mmol/L (ref 3.5–5.1)
Sodium: 139 mmol/L (ref 135–145)

## 2019-11-04 LAB — PREPARE PLATELET PHERESIS
Unit division: 0
Unit division: 0

## 2019-11-04 LAB — URINALYSIS, ROUTINE W REFLEX MICROSCOPIC
Bilirubin Urine: NEGATIVE
Glucose, UA: NEGATIVE mg/dL
Hgb urine dipstick: NEGATIVE
Ketones, ur: NEGATIVE mg/dL
Leukocytes,Ua: NEGATIVE
Nitrite: NEGATIVE
Protein, ur: NEGATIVE mg/dL
Specific Gravity, Urine: 1.021 (ref 1.005–1.030)
pH: 6 (ref 5.0–8.0)

## 2019-11-04 LAB — BPAM PLATELET PHERESIS
Blood Product Expiration Date: 202104052359
Blood Product Expiration Date: 202104052359
ISSUE DATE / TIME: 202104021853
ISSUE DATE / TIME: 202104021853
Unit Type and Rh: 5100
Unit Type and Rh: 5100

## 2019-11-04 LAB — CBC
HCT: 43 % (ref 39.0–52.0)
HCT: 44.6 % (ref 39.0–52.0)
Hemoglobin: 14.8 g/dL (ref 13.0–17.0)
Hemoglobin: 15.5 g/dL (ref 13.0–17.0)
MCH: 31.9 pg (ref 26.0–34.0)
MCH: 32.4 pg (ref 26.0–34.0)
MCHC: 34.4 g/dL (ref 30.0–36.0)
MCHC: 34.8 g/dL (ref 30.0–36.0)
MCV: 92.7 fL (ref 80.0–100.0)
MCV: 93.3 fL (ref 80.0–100.0)
Platelets: 189 10*3/uL (ref 150–400)
Platelets: 208 10*3/uL (ref 150–400)
RBC: 4.64 MIL/uL (ref 4.22–5.81)
RBC: 4.78 MIL/uL (ref 4.22–5.81)
RDW: 11.8 % (ref 11.5–15.5)
RDW: 11.7 % (ref 11.5–15.5)
WBC: 8.1 10*3/uL (ref 4.0–10.5)
WBC: 11 10*3/uL — ABNORMAL HIGH (ref 4.0–10.5)
nRBC: 0 % (ref 0.0–0.2)
nRBC: 0 % (ref 0.0–0.2)

## 2019-11-04 LAB — POCT I-STAT 7, (LYTES, BLD GAS, ICA,H+H)
Acid-Base Excess: 1 mmol/L (ref 0.0–2.0)
Bicarbonate: 24.7 mmol/L (ref 20.0–28.0)
Calcium, Ion: 1.24 mmol/L (ref 1.15–1.40)
HCT: 40 % (ref 39.0–52.0)
Hemoglobin: 13.6 g/dL (ref 13.0–17.0)
O2 Saturation: 91 %
Patient temperature: 98.1
Potassium: 3.6 mmol/L (ref 3.5–5.1)
Sodium: 138 mmol/L (ref 135–145)
TCO2: 26 mmol/L (ref 22–32)
pCO2 arterial: 34 mmHg (ref 32.0–48.0)
pH, Arterial: 7.47 — ABNORMAL HIGH (ref 7.350–7.450)
pO2, Arterial: 56 mmHg — ABNORMAL LOW (ref 83.0–108.0)

## 2019-11-04 LAB — LIPID PANEL
Cholesterol: 144 mg/dL (ref 0–200)
HDL: 54 mg/dL (ref >40)
LDL Cholesterol: 27 mg/dL (ref 0–99)
Total CHOL/HDL Ratio: 2.7 RATIO
Triglycerides: 317 mg/dL — ABNORMAL HIGH (ref <150)
VLDL: 63 mg/dL — ABNORMAL HIGH (ref 0–40)

## 2019-11-04 LAB — SURGICAL PCR SCREEN
MRSA, PCR: NEGATIVE
Staphylococcus aureus: NEGATIVE

## 2019-11-04 LAB — MAGNESIUM: Magnesium: 1.8 mg/dL (ref 1.7–2.4)

## 2019-11-04 LAB — HEPARIN LEVEL (UNFRACTIONATED): Heparin Unfractionated: 0.21 IU/mL — ABNORMAL LOW (ref 0.30–0.70)

## 2019-11-04 LAB — ABO/RH: ABO/RH(D): AB POS

## 2019-11-04 LAB — TROPONIN I (HIGH SENSITIVITY): Troponin I (High Sensitivity): 924 ng/L (ref <18)

## 2019-11-04 LAB — ECHOCARDIOGRAM COMPLETE
Height: 69 in
Weight: 4000 oz

## 2019-11-04 LAB — PHOSPHORUS: Phosphorus: 3.5 mg/dL (ref 2.5–4.6)

## 2019-11-04 MED ORDER — METOPROLOL TARTRATE 25 MG PO TABS
25.0000 mg | ORAL_TABLET | Freq: Two times a day (BID) | ORAL | Status: DC
  Administered 2019-11-04 – 2019-11-05 (×3): 25 mg via ORAL
  Filled 2019-11-04 (×3): qty 1

## 2019-11-04 MED ORDER — MUPIROCIN 2 % EX OINT
1.0000 "application " | TOPICAL_OINTMENT | Freq: Two times a day (BID) | CUTANEOUS | Status: DC
  Administered 2019-11-04 (×2): 1 via NASAL
  Filled 2019-11-04: qty 22

## 2019-11-04 MED ORDER — THIAMINE HCL 100 MG PO TABS
100.0000 mg | ORAL_TABLET | Freq: Every day | ORAL | Status: DC
  Administered 2019-11-04 – 2019-11-10 (×5): 100 mg via ORAL
  Filled 2019-11-04 (×6): qty 1

## 2019-11-04 MED ORDER — COLCHICINE 0.6 MG PO TABS
0.6000 mg | ORAL_TABLET | Freq: Every day | ORAL | Status: DC
  Administered 2019-11-05 – 2019-11-06 (×2): 0.6 mg via ORAL
  Filled 2019-11-04 (×2): qty 1

## 2019-11-04 MED ORDER — LORAZEPAM 1 MG PO TABS: 1.0000 mg | ORAL_TABLET | ORAL | Status: DC | PRN

## 2019-11-04 MED ORDER — OXYCODONE-ACETAMINOPHEN 5-325 MG PO TABS
1.0000 | ORAL_TABLET | Freq: Four times a day (QID) | ORAL | Status: DC | PRN
  Administered 2019-11-04: 1 via ORAL
  Filled 2019-11-04: qty 1

## 2019-11-04 MED ORDER — ADULT MULTIVITAMIN W/MINERALS CH
1.0000 | ORAL_TABLET | Freq: Every day | ORAL | Status: DC
  Administered 2019-11-04 – 2019-11-06 (×2): 1 via ORAL
  Filled 2019-11-04 (×2): qty 1

## 2019-11-04 MED ORDER — THIAMINE HCL 100 MG/ML IJ SOLN
100.0000 mg | Freq: Every day | INTRAMUSCULAR | Status: DC
  Filled 2019-11-04: qty 2

## 2019-11-04 MED ORDER — FOLIC ACID 1 MG PO TABS
1.0000 mg | ORAL_TABLET | Freq: Every day | ORAL | Status: DC
  Administered 2019-11-04 – 2019-11-11 (×6): 1 mg via ORAL
  Filled 2019-11-04 (×6): qty 1

## 2019-11-04 MED ORDER — COLCHICINE 0.6 MG PO TABS
1.2000 mg | ORAL_TABLET | Freq: Once | ORAL | Status: AC
  Administered 2019-11-04: 1.2 mg via ORAL
  Filled 2019-11-04: qty 2

## 2019-11-04 MED ORDER — PERFLUTREN LIPID MICROSPHERE
1.0000 mL | INTRAVENOUS | Status: AC | PRN
  Administered 2019-11-04: 5 mL via INTRAVENOUS
  Filled 2019-11-04: qty 10

## 2019-11-04 MED ORDER — LORAZEPAM 2 MG/ML IJ SOLN: 1.0000 mg | INTRAMUSCULAR | Status: DC | PRN

## 2019-11-04 NOTE — Progress Notes (Addendum)
ANTICOAGULATION CONSULT NOTE  Pharmacy Consult for heparin Indication: chest pain/ACS  No Known Allergies  Patient Measurements: Height: 5\' 9"  (175.3 cm) Weight: 113.4 kg (250 lb)(per patient) IBW/kg (Calculated) : 70.7 Heparin Dosing Weight: 96 kg  Vital Signs: Temp: 98.3 F (36.8 C) (04/01 1932) Temp Source: Oral (04/01 1932) BP: 138/82 (04/01 1900) Pulse Rate: 86 (04/01 1900)  Labs: Recent Labs    11/03/19 1417 11/03/19 1430  HGB 14.3 14.8  HCT 42.0 42.2  PLT  --  203  APTT  --  27  LABPROT  --  13.8  INR  --  1.1  CREATININE 0.80 0.98  TROPONINIHS  --  67*    Estimated Creatinine Clearance: 105.8 mL/min (by C-G formula based on SCr of 0.98 mg/dL).  Assessment: 54 yo M starting on heparin for CP/ACS s/p LHC found to have severe RCA, LAD, mod LCx disease pending TCTS consult. Pharmacy consulted to start heparin 8 hours after sheath removal. Sheath removal time not documented in cath report but case ended at 15:15. Bedside RN not sure of removal time but arrived to unit with TR band, no sheath, and significant hematoma in R wrist. Pre-cath H/H , plt wnl. Will start heparin at 23:00. Planned for CABG second case 11/04/19.   Initial heparin level low but planning for CABG in a few hours so will not adjust.  Goal of Therapy:  Heparin level 0.3-0.7 units/ml Monitor platelets by anticoagulation protocol: Yes   Plan:  -Continue heparin 1350 units/h for now -Follow-up after CABG  ADDENDUM: CABG delayed for now with emergent case, will increase heparin slightly since subtherapeutic earlier in case CABG delayed until tomorrow/Monday.  Plan: -Increase heparin slightly to 1400 units/h   01/04/20, PharmD, BCPS Clinical Pharmacist [phone] Please check AMION for all [facility] Pharmacy numbers 11/04/2019

## 2019-11-04 NOTE — Progress Notes (Signed)
Subjective:  No chest pain  Objective:  Vital Signs in the last 24 hours: Temp:  [97.7 F (36.5 C)-98.5 F (36.9 C)] 97.9 F (36.6 C) (04/02 0731) Pulse Rate:  [0-86] 68 (04/02 0630) Resp:  [11-23] 19 (04/02 0630) BP: (98-160)/(69-99) 118/70 (04/02 0630) SpO2:  [91 %-100 %] 98 % (04/02 0630) Weight:  [113.4 kg] 113.4 kg (04/01 1900)  Intake/Output from previous day: 04/01 0701 - 04/02 0700 In: 733.1 [I.V.:733.1] Out: 1820 [Urine:1820]  Physical Exam  Constitutional: He is oriented to person, place, and time. He appears well-developed and well-nourished. No distress.  HENT:  Head: Normocephalic and atraumatic.  Eyes: Pupils are equal, round, and reactive to light. Conjunctivae are normal.  Neck: No JVD present.  Cardiovascular: Normal rate, regular rhythm and normal heart sounds.  No murmur heard. Pulses:      Femoral pulses are 2+ on the right side and 2+ on the left side.      Popliteal pulses are 0 on the right side and 1+ on the left side.       Dorsalis pedis pulses are 0 on the right side and 0 on the left side.       Posterior tibial pulses are 0 on the right side and 0 on the left side.  Pulmonary/Chest: Effort normal and breath sounds normal. He has no wheezes. He has no rales.  Abdominal: Soft. Bowel sounds are normal. There is no rebound.  Musculoskeletal:        General: No edema.  Lymphadenopathy:    He has no cervical adenopathy.  Neurological: He is alert and oriented to person, place, and time. No cranial nerve deficit.  Skin: Skin is warm and dry.  Psychiatric: He has a normal mood and affect.  Nursing note and vitals reviewed.    Lab Results: BMP Recent Labs    11/03/19 1417 11/03/19 1417 11/03/19 1430 11/04/19 0324 11/04/19 0431  NA 138   < > 139 139 138  K 3.7   < > 3.7 3.6 3.6  CL 101  --  103 101  --   CO2  --   --  24 24  --   GLUCOSE 98  --  99 119*  --   BUN 12  --  12 10  --   CREATININE 0.80  --  0.98 1.07  --   CALCIUM  --   --   9.9 9.3  --   GFRNONAA  --   --  >60 >60  --   GFRAA  --   --  >60 >60  --    < > = values in this interval not displayed.    CBC Recent Labs  Lab 11/04/19 0324 11/04/19 0324 11/04/19 0431  WBC 8.1  --   --   RBC 4.64  --   --   HGB 14.8   < > 13.6  HCT 43.0   < > 40.0  PLT 189  --   --   MCV 92.7  --   --   MCH 31.9  --   --   MCHC 34.4  --   --   RDW 11.8  --   --    < > = values in this interval not displayed.    HEMOGLOBIN A1C Lab Results  Component Value Date   HGBA1C 5.5 11/03/2019   MPG 111.15 11/03/2019   Results for Sean Flynn, Sean Flynn (MRN [id number]) as of 11/04/2019 08:49  Ref. Range 11/03/2019 14:30 11/04/2019  06:00  Troponin I (High Sensitivity) Latest Ref Range: <18 ng/L 67 (H) 924 (HH)   Lipid Panel     Component Value Date/Time   CHOL 144 11/04/2019 0324   TRIG 317 (H) 11/04/2019 0324   HDL 54 11/04/2019 0324   CHOLHDL 2.7 11/04/2019 0324   VLDL 63 (H) 11/04/2019 0324   LDLCALC 27 11/04/2019 0324     Hepatic Function Panel Recent Labs    11/03/19 1430  PROT 6.2*  ALBUMIN 3.8  AST 34  ALT 36  ALKPHOS 36*  BILITOT 0.9     Cardiovascular Studies:  Echocardiogram 11/04/2019: 1. Mild LVH. Normal LV size. Mild global hypokinesis. LVEF 45-50%. Normal  diastolic function.  2. Normal RV size and function.  3. No significant valvular abnormality.  4. Normal right atrial pressure.   Coronary angiogram 11/03/2019: LM: Normal LAD: Severe ostial to mid calcification with long 50-75% calcified lesion upto mid LAD. Distal vessel is spared of calcification, with excellent distal surgical target.  LCx: Mid calcific 50% stenosis. RCA: Severely calcified prox CTO with a narrow antegrade collateral, adequate to maintain TIMI III flow at rest, follwowed by log mid 60-80% long calcified lesion. Distal vessel is spared of calcification, with excellent distal surgical target.   LVEDP 20 mmHg  EKG 11/03/2019: Sinus rhythm. Nonspecific ST-T changes. EMS EKG  showed transient ST elevation, inferior leads.   CT cardiac scoring 10/24/2019: TOTAL CALCIUM SCORE :  5002 LEFT MAIN:126 LAD:1634 CIRCUMFLEX: 633 RCA: 2609  Assessment & Recommendations:   54 y/o Caucasian male with hypertension, hyperlipidemia, former smoker, EtOH overuse, admitted with acute coronary syndrome  Acute coronary syndrome: Transient inferior ST elevation, currently resolved. Chest pain free Trop HS peak 924. Severe RCA, LAD, mod LCx disease. EF 45-50% with mild global hypokinesis. Lipid panel unremarkable. A1C normal. Lipoprotein (a) pending.  Continue Aspirin/heparin/IV NTG/beta blocker. Lipitor 80 mg.  Claudication: Suspect severe PAD. Femoral pulse 2 + b/l, Pop 1+ Lt, = Rt, Absent DP/PT b/l Will check LE [place] and aorta duplex this morning, prior to CABG.  H/o EtOH overuse: CIWA protocol  Nigel Mormon, M.D. [facility], [place] Pager: [phone] Office: [phone]

## 2019-11-04 NOTE — Progress Notes (Signed)
Aorta/iliac arteries  has been completed. Refer to [facility] under chart review to view preliminary results.   11/04/2019  1:15 PM Neri Vieyra, Gerarda Gunther

## 2019-11-04 NOTE — Progress Notes (Signed)
Discussed sternal precautions, IS (2300 mL), mobility post op and d/c planning with pt and wife. Very receptive. Gave educational materials to review and preop video to watch. Wife will be with him at d/c. [id number] Ethelda Chick CES, ACSM 9:48 AM 11/04/2019

## 2019-11-04 NOTE — Progress Notes (Addendum)
  Echocardiogram 2D Echocardiogram has been performed with Definity.  Gerda Diss 11/04/2019, 8:31 AM

## 2019-11-04 NOTE — Progress Notes (Signed)
Surgery moved to Monday. OK to eat per Lowella Dandy, PA.

## 2019-11-05 ENCOUNTER — Other Ambulatory Visit (HOSPITAL_COMMUNITY): Payer: BC Managed Care – PPO

## 2019-11-05 ENCOUNTER — Inpatient Hospital Stay (HOSPITAL_COMMUNITY): Payer: BC Managed Care – PPO

## 2019-11-05 LAB — GRAM STAIN: Special Requests: NORMAL

## 2019-11-05 LAB — HEPARIN LEVEL (UNFRACTIONATED)
Heparin Unfractionated: 0.26 IU/mL — ABNORMAL LOW (ref 0.30–0.70)
Heparin Unfractionated: 0.43 IU/mL (ref 0.30–0.70)

## 2019-11-05 LAB — SYNOVIAL CELL COUNT + DIFF, W/ CRYSTALS
Monocyte-Macrophage-Synovial Fluid: 14 % — ABNORMAL LOW (ref 50–90)
Neutrophil, Synovial: 86 % — ABNORMAL HIGH (ref 0–25)
WBC, Synovial: 34000 /mm3 — ABNORMAL HIGH (ref 0–200)

## 2019-11-05 LAB — BASIC METABOLIC PANEL
Anion gap: 15 (ref 5–15)
BUN: 6 mg/dL (ref 6–20)
CO2: 24 mmol/L (ref 22–32)
Calcium: 9.6 mg/dL (ref 8.9–10.3)
Chloride: 102 mmol/L (ref 98–111)
Creatinine, Ser: 1.09 mg/dL (ref 0.61–1.24)
GFR calc Af Amer: >60 mL/min (ref >60)
GFR calc non Af Amer: >60 mL/min (ref >60)
Glucose, Bld: 121 mg/dL — ABNORMAL HIGH (ref 70–99)
Potassium: 3.6 mmol/L (ref 3.5–5.1)
Sodium: 141 mmol/L (ref 135–145)

## 2019-11-05 LAB — LIPOPROTEIN A (LPA): Lipoprotein (a): 213.3 nmol/L — ABNORMAL HIGH (ref <75.0)

## 2019-11-05 MED ORDER — POTASSIUM CHLORIDE 2 MEQ/ML IV SOLN
80.0000 meq | INTRAVENOUS | Status: DC
  Filled 2019-11-05: qty 40

## 2019-11-05 MED ORDER — TRANEXAMIC ACID (OHS) BOLUS VIA INFUSION
15.0000 mg/kg | INTRAVENOUS | Status: AC
  Administered 2019-11-07: 09:00:00 1690.5 mg via INTRAVENOUS
  Filled 2019-11-05: qty 1691

## 2019-11-05 MED ORDER — NOREPINEPHRINE 4 MG/250ML-% IV SOLN
0.0000 ug/min | INTRAVENOUS | Status: DC
  Filled 2019-11-05: qty 250

## 2019-11-05 MED ORDER — TRANEXAMIC ACID 1000 MG/10ML IV SOLN
1.5000 mg/kg/h | INTRAVENOUS | Status: DC
  Filled 2019-11-05: qty 25

## 2019-11-05 MED ORDER — SODIUM CHLORIDE 0.9 % IV SOLN
1.5000 g | INTRAVENOUS | Status: DC
  Filled 2019-11-05: qty 1.5

## 2019-11-05 MED ORDER — DEXMEDETOMIDINE HCL IN NACL 400 MCG/100ML IV SOLN
0.1000 ug/kg/h | INTRAVENOUS | Status: DC
  Filled 2019-11-05: qty 100

## 2019-11-05 MED ORDER — NITROGLYCERIN IN D5W 200-5 MCG/ML-% IV SOLN
2.0000 ug/min | INTRAVENOUS | Status: DC
  Filled 2019-11-05: qty 250

## 2019-11-05 MED ORDER — SODIUM CHLORIDE 0.9 % IV SOLN
750.0000 mg | INTRAVENOUS | Status: DC
  Filled 2019-11-05: qty 750

## 2019-11-05 MED ORDER — LIDOCAINE HCL 1 % IJ SOLN
20.0000 mL | Freq: Once | INTRAMUSCULAR | Status: DC
  Filled 2019-11-05: qty 20

## 2019-11-05 MED ORDER — MILRINONE LACTATE IN DEXTROSE 20-5 MG/100ML-% IV SOLN
0.3000 ug/kg/min | INTRAVENOUS | Status: DC
  Filled 2019-11-05: qty 100

## 2019-11-05 MED ORDER — MAGNESIUM SULFATE 50 % IJ SOLN
40.0000 meq | INTRAMUSCULAR | Status: DC
  Filled 2019-11-05: qty 9.85

## 2019-11-05 MED ORDER — SODIUM CHLORIDE 0.9 % IV SOLN
INTRAVENOUS | Status: DC
  Filled 2019-11-05: qty 30

## 2019-11-05 MED ORDER — PHENYLEPHRINE HCL-NACL 20-0.9 MG/250ML-% IV SOLN
30.0000 ug/min | INTRAVENOUS | Status: DC
  Filled 2019-11-05: qty 250

## 2019-11-05 MED ORDER — INSULIN REGULAR(HUMAN) IN NACL 100-0.9 UT/100ML-% IV SOLN
INTRAVENOUS | Status: DC
  Filled 2019-11-05: qty 100

## 2019-11-05 MED ORDER — PLASMA-LYTE 148 IV SOLN
INTRAVENOUS | Status: DC
  Filled 2019-11-05: qty 2.5

## 2019-11-05 MED ORDER — TRANEXAMIC ACID (OHS) PUMP PRIME SOLUTION
2.0000 mg/kg | INTRAVENOUS | Status: DC
  Filled 2019-11-05: qty 2.25

## 2019-11-05 MED ORDER — EPINEPHRINE HCL 5 MG/250ML IV SOLN IN NS
0.0000 ug/min | INTRAVENOUS | Status: DC
  Filled 2019-11-05: qty 250

## 2019-11-05 MED ORDER — VANCOMYCIN HCL 1500 MG/300ML IV SOLN
1500.0000 mg | INTRAVENOUS | Status: DC
  Filled 2019-11-05: qty 300

## 2019-11-05 NOTE — Progress Notes (Signed)
  Subjective: Patient is a 54 y.o. Male who is admitted for ACS with planned CABG on 11/07/19.  He complains of right knee pain and swelling of sudden onset without injury over the last 3 days. Denies any falls/injuries. Has a history of gout which has affected his knees, ankles, and 1st MTP joints.  Normally takes colchicine with onset of gout. Does not take allopurinol chronically. Denies any fevers, chills, malaise.    Objective: Vital signs in last 24 hours: Temp:  [97.6 F (36.4 C)-98.1 F (36.7 C)] 97.6 F (36.4 C) (04/03 0406) Pulse Rate:  [56-91] 76 (04/03 0406) Resp:  [13-20] 16 (04/03 0406) BP: (106-140)/(70-91) 122/91 (04/03 0406) SpO2:  [96 %-100 %] 97 % (04/03 0406) Weight:  [112.7 kg] 112.7 kg (04/03 0406)  Intake/Output from previous day: 04/02 0701 - 04/03 0700 In: 1617.3 [P.O.:1200; I.V.:417.3] Out: 2510 [Urine:2510] Intake/Output this shift: No intake/output data recorded.  Exam:  Significant effusion of the Right knee compared with contralateral side.  Slightly increased warmth of the right knee.  Mild pain with passive flexion/extension.   Labs: Recent Labs    11/03/19 1417 11/03/19 1430 11/04/19 0324 11/04/19 0431 11/04/19 1812  HGB 14.3 14.8 14.8 13.6 15.5   Recent Labs    11/04/19 0324 11/04/19 0324 11/04/19 0431 11/04/19 1812  WBC 8.1  --   --  11.0*  RBC 4.64  --   --  4.78  HCT 43.0   < > 40.0 44.6  PLT 189  --   --  208   < > = values in this interval not displayed.   Recent Labs    11/04/19 1812 11/05/19 0403  NA 139 141  K 3.8 3.6  CL 101 102  CO2 27 24  BUN 8 6  CREATININE 1.12 1.09  GLUCOSE 113* 121*  CALCIUM 9.5 9.6   Recent Labs    11/03/19 1430  INR 1.1    Assessment/Plan: Right knee effusion likely secondary to acute gout attack  Plan for right knee aspiration this morning.  Patient's knee was prepped with alcohol and chloraprep and then anesthetized with 5 cc of Lidocaine.  The knee joint was aspirated with a  syringe and a 18 gauge needle. ~90 cc was obtained.  Fluid will be sent for cell count, gram stain, crystal analysis, and culture. Patient tolerated the procedure well.  There was no intra-articular injection of NSAIDs or steroids, per the recommendation of patient's cardiologist.    Joycie Peek Matyas Baisley 11/05/2019, 11:37 AM

## 2019-11-05 NOTE — Progress Notes (Addendum)
Subjective:  No chest pain Having right knee pain, Has h/o gout  Objective:  Vital Signs in the last 24 hours: Temp:  [97.6 F (36.4 C)-98.1 F (36.7 C)] 97.6 F (36.4 C) (04/03 0406) Pulse Rate:  [56-91] 76 (04/03 0406) Resp:  [13-20] 16 (04/03 0406) BP: (106-140)/(70-91) 122/91 (04/03 0406) SpO2:  [96 %-100 %] 97 % (04/03 0406) Weight:  [112.7 kg] 112.7 kg (04/03 0406)  Intake/Output from previous day: 04/02 0701 - 04/03 0700 In: 1617.3 [P.O.:1200; I.V.:417.3] Out: 2510 [Urine:2510]  Physical Exam  Constitutional: He is oriented to person, place, and time. He appears well-developed and well-nourished. No distress.  HENT:  Head: Normocephalic and atraumatic.  Eyes: Pupils are equal, round, and reactive to light. Conjunctivae are normal.  Neck: No JVD present.  Cardiovascular: Normal rate, regular rhythm and normal heart sounds.  No murmur heard. Pulses:      Femoral pulses are 2+ on the right side and 2+ on the left side.      Popliteal pulses are 0 on the right side and 1+ on the left side.       Dorsalis pedis pulses are 0 on the right side and 0 on the left side.       Posterior tibial pulses are 0 on the right side and 0 on the left side.  Pulmonary/Chest: Effort normal and breath sounds normal. He has no wheezes. He has no rales.  Abdominal: Soft. Bowel sounds are normal. There is no rebound.  Musculoskeletal:        General: No edema.     Right knee: Swelling, effusion and erythema present. Decreased range of motion.  Lymphadenopathy:    He has no cervical adenopathy.  Neurological: He is alert and oriented to person, place, and time. No cranial nerve deficit.  Skin: Skin is warm and dry.  Psychiatric: He has a normal mood and affect.  Nursing note and vitals reviewed.    Lab Results: BMP Recent Labs    11/04/19 0324 11/04/19 0324 11/04/19 0431 11/04/19 1812 11/05/19 0403  NA 139   < > 138 139 141  K 3.6   < > 3.6 3.8 3.6  CL 101  --   --  101 102   CO2 24  --   --  27 24  GLUCOSE 119*  --   --  113* 121*  BUN 10  --   --  8 6  CREATININE 1.07  --   --  1.12 1.09  CALCIUM 9.3  --   --  9.5 9.6  GFRNONAA >60  --   --  >60 >60  GFRAA >60  --   --  >60 >60   < > = values in this interval not displayed.    CBC Recent Labs  Lab 11/04/19 1812  WBC 11.0*  RBC 4.78  HGB 15.5  HCT 44.6  PLT 208  MCV 93.3  MCH 32.4  MCHC 34.8  RDW 11.7    HEMOGLOBIN A1C Lab Results  Component Value Date   HGBA1C 5.5 11/03/2019   MPG 111.15 11/03/2019   Results for ANTWAUN, BUTH (MRN [id number]) as of 11/04/2019 08:49  Ref. Range 11/03/2019 14:30 11/04/2019 06:00  Troponin I (High Sensitivity) Latest Ref Range: <18 ng/L 67 (H) 924 (HH)   Lipid Panel     Component Value Date/Time   CHOL 144 11/04/2019 0324   TRIG 317 (H) 11/04/2019 0324   HDL 54 11/04/2019 0324   CHOLHDL 2.7 11/04/2019 0324  VLDL 63 (H) 11/04/2019 0324   LDLCALC 27 11/04/2019 0324     Hepatic Function Panel Recent Labs    11/03/19 1430 11/04/19 1812  PROT 6.2* 6.8  ALBUMIN 3.8 4.0  AST 34 34  ALT 36 33  ALKPHOS 36* 42  BILITOT 0.9 1.2     Cardiovascular Studies:  Echocardiogram 11/04/2019: 1. Mild LVH. Normal LV size. Mild global hypokinesis. LVEF 45-50%. Normal  diastolic function.  2. Normal RV size and function.  3. No significant valvular abnormality.  4. Normal right atrial pressure.   LEA duplex 11/04/2019: Patent distal abdominal aorta and bilateral iliac arteries without  evidence of significant stenosis.  Right: 75-99% stenosis noted in the superficial femoral artery. Posterior  tibial artery appears occluded.  Left: 75-99% stenosis noted in the superficial femoral artery. Posterior  tibial artery appears occluded.   Coronary angiogram 11/03/2019: LM: Normal LAD: Severe ostial to mid calcification with long 50-75% calcified lesion upto mid LAD. Distal vessel is spared of calcification, with excellent distal surgical target.  LCx: Mid  calcific 50% stenosis. RCA: Severely calcified prox CTO with a narrow antegrade collateral, adequate to maintain TIMI III flow at rest, follwowed by log mid 60-80% long calcified lesion. Distal vessel is spared of calcification, with excellent distal surgical target.   LVEDP 20 mmHg  EKG 11/03/2019: Sinus rhythm. Nonspecific ST-T changes. EMS EKG showed transient ST elevation, inferior leads.   CT cardiac scoring 10/24/2019: TOTAL CALCIUM SCORE :  5002 LEFT MAIN:126 LAD:1634 CIRCUMFLEX: 633 RCA: 2609  Assessment & Recommendations:   54 y/o Caucasian male with hypertension, hyperlipidemia, former smoker, h/o EtOH overuse, h/o gout, admitted with acute coronary syndrome  Acute gout exacerbation: Conintue colchicine. Avoiding NSAIDS and steroids in the setting of MI and impending CABG. Discussed with orthopedics re: possible right knee arthrocentesis. Will hold Lipitor for now. ?gout attack etiology  Acute coronary syndrome: Transient inferior ST elevation, currently resolved. Chest pain free Trop HS peak 924. Severe RCA, LAD, mod LCx disease. EF 45-50% with mild global hypokinesis. Lipid panel unremarkable. A1C normal. Lipoprotein (a) pending.  Continue Aspirin/heparin/IV NTG/beta blocker. Will hold Lipitor for now. ?gout attack etiology Awaiting CABG 4/5.  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  H/o EtOH overuse: CIWA protocol  Nigel Mormon, M.D. [facility], [place] Pager: [phone] Office: [phone]

## 2019-11-05 NOTE — Progress Notes (Signed)
ANTICOAGULATION CONSULT NOTE  Pharmacy Consult for heparin Indication: chest pain/ACS  No Known Allergies  Patient Measurements: Height: 5\' 9"  (175.3 cm) Weight: 113.4 kg (250 lb)(per patient) IBW/kg (Calculated) : 70.7 Heparin Dosing Weight: 96 kg  Vital Signs: Temp: 98.3 F (36.8 C) (04/01 1932) Temp Source: Oral (04/01 1932) BP: 138/82 (04/01 1900) Pulse Rate: 86 (04/01 1900)  Labs: Recent Labs    11/03/19 1417 11/03/19 1430  HGB 14.3 14.8  HCT 42.0 42.2  PLT  --  203  APTT  --  27  LABPROT  --  13.8  INR  --  1.1  CREATININE 0.80 0.98  TROPONINIHS  --  67*    Estimated Creatinine Clearance: 105.8 mL/min (by C-G formula based on SCr of 0.98 mg/dL).  Assessment: 54 yo M starting on heparin for CP/ACS s/p LHC found to have severe RCA, LAD, mod LCx disease pending TCTS consult. Pharmacy consulted to start heparin post-op. CABG rescheduled for 4/5.  Heparin level slightly low at 0.26. CBC wnl. No bleeding or issues with infusion per discussion with RN.  Goal of Therapy:  Heparin level 0.3-0.7 units/ml Monitor platelets by anticoagulation protocol: Yes   Plan:  Increase heparin to 1600 units/hr 6hr heparin level Monitor daily heparin level and CBC, s/sx bleeding CABG now planned 4/5   6/5, PharmD, BCPS Please check AMION for all [facility] contact numbers Clinical Pharmacist 11/05/2019 5:28 AM

## 2019-11-05 NOTE — Progress Notes (Signed)
ANTICOAGULATION CONSULT NOTE  Pharmacy Consult for heparin Indication: chest pain/ACS  Patient Measurements: Height: 5\' 9"  (175.3 cm) Weight: 113.4 kg (250 lb)(per patient) IBW/kg (Calculated) : 70.7 Heparin Dosing Weight: 96 kg  Vital Signs: Temp: 98.3 F (36.8 C) (04/01 1932) Temp Source: Oral (04/01 1932) BP: 138/82 (04/01 1900) Pulse Rate: 86 (04/01 1900)  Labs: Recent Labs    11/03/19 1417 11/03/19 1430  HGB 14.3 14.8  HCT 42.0 42.2  PLT  --  203  APTT  --  27  LABPROT  --  13.8  INR  --  1.1  CREATININE 0.80 0.98  TROPONINIHS  --  67*    Estimated Creatinine Clearance: 105.8 mL/min (by C-G formula based on SCr of 0.98 mg/dL).  Assessment: 54 yo M starting on heparin for CP/ACS s/p LHC found to have severe RCA, LAD, mod LCx disease pending TCTS consult. Pharmacy consulted to start heparin 8 hours after sheath removal. Sheath removal time not documented in cath report but case ended at 15:15. Bedside RN not sure of removal time but arrived to unit with TR band, no sheath, and significant hematoma in R wrist. Pre-cath H/H , plt wnl.  Heparin level is therapeutic. H/h plts wnl   Goal of Therapy:  Heparin level 0.3-0.7 units/ml Monitor platelets by anticoagulation protocol: Yes   Plan: -Heparin gtt at 1600 units/hr -Daily HL, CBC -CABG Monday   Friday 11/05/2019 1:48 PM

## 2019-11-05 NOTE — Progress Notes (Signed)
2 Days Post-Op Procedure(s) (LRB): LEFT HEART CATH AND CORONARY ANGIOGRAPHY (N/A) Subjective: Watching basketball game. No CP at present Right knee pain- was tapped earlier today.  Objective: Vital signs in last 24 hours: Temp:  [97.6 F (36.4 C)-98.1 F (36.7 C)] 97.6 F (36.4 C) (04/03 0406) Pulse Rate:  [76-91] 76 (04/03 0406) Cardiac Rhythm: Normal sinus rhythm (04/03 0800) Resp:  [15-19] 16 (04/03 0406) BP: (122-140)/(83-91) 122/91 (04/03 0406) SpO2:  [97 %-100 %] 97 % (04/03 0406) Weight:  [112.7 kg] 112.7 kg (04/03 0406)  Hemodynamic parameters for last 24 hours:    Intake/Output from previous day: 04/02 0701 - 04/03 0700 In: 1617.3 [P.O.:1200; I.V.:417.3] Out: 2510 [Urine:2510] Intake/Output this shift: No intake/output data recorded.  General appearance: alert, cooperative and no distress Neurologic: intact Heart: regular rate and rhythm Extremities: right knee swollen and red  Lab Results: Recent Labs    11/04/19 0324 11/04/19 0324 11/04/19 0431 11/04/19 1812  WBC 8.1  --   --  11.0*  HGB 14.8   < > 13.6 15.5  HCT 43.0   < > 40.0 44.6  PLT 189  --   --  208   < > = values in this interval not displayed.   BMET:  Recent Labs    11/04/19 1812 11/05/19 0403  NA 139 141  K 3.8 3.6  CL 101 102  CO2 27 24  GLUCOSE 113* 121*  BUN 8 6  CREATININE 1.12 1.09  CALCIUM 9.5 9.6    PT/INR:  Recent Labs    11/03/19 1430  LABPROT 13.8  INR 1.1   ABG    Component Value Date/Time   PHART 7.470 (H) 11/04/2019 0431   HCO3 24.7 11/04/2019 0431   TCO2 26 11/04/2019 0431   O2SAT 91.0 11/04/2019 0431   CBG (last 3)  No results for input(s): GLUCAP in the last 72 hours.  Assessment/Plan: S/P Procedure(s) (LRB): LEFT HEART CATH AND CORONARY ANGIOGRAPHY (N/A) - Severe 2 vessel CAD  Gout- acute- tapped earlier today- WBC 34K, GS negative for organisms For CABG on Monday pending evolution of gout issues All questions answered   LOS: 2 days     Loreli Slot 11/05/2019

## 2019-11-06 ENCOUNTER — Other Ambulatory Visit (HOSPITAL_COMMUNITY): Payer: BC Managed Care – PPO

## 2019-11-06 ENCOUNTER — Inpatient Hospital Stay (HOSPITAL_COMMUNITY): Payer: BC Managed Care – PPO

## 2019-11-06 LAB — BASIC METABOLIC PANEL
Anion gap: 15 (ref 5–15)
BUN: 6 mg/dL (ref 6–20)
CO2: 24 mmol/L (ref 22–32)
Calcium: 9.3 mg/dL (ref 8.9–10.3)
Chloride: 100 mmol/L (ref 98–111)
Creatinine, Ser: 1.04 mg/dL (ref 0.61–1.24)
GFR calc Af Amer: >60 mL/min (ref >60)
GFR calc non Af Amer: >60 mL/min (ref >60)
Glucose, Bld: 122 mg/dL — ABNORMAL HIGH (ref 70–99)
Potassium: 3.5 mmol/L (ref 3.5–5.1)
Sodium: 139 mmol/L (ref 135–145)

## 2019-11-06 LAB — TYPE AND SCREEN
ABO/RH(D): AB POS
ABO/RH(D): AB POS
ABO/RH(D): AB POS
Antibody Screen: NEGATIVE
Antibody Screen: NEGATIVE
Antibody Screen: NEGATIVE
Unit division: 0
Unit division: 0

## 2019-11-06 LAB — BPAM RBC
Blood Product Expiration Date: 202104102359
Blood Product Expiration Date: 202105012359
Unit Type and Rh: 8400
Unit Type and Rh: 8400

## 2019-11-06 LAB — CBC
HCT: 42.5 % (ref 39.0–52.0)
Hemoglobin: 14.8 g/dL (ref 13.0–17.0)
MCH: 32.3 pg (ref 26.0–34.0)
MCHC: 34.8 g/dL (ref 30.0–36.0)
MCV: 92.8 fL (ref 80.0–100.0)
Platelets: 181 10*3/uL (ref 150–400)
RBC: 4.58 MIL/uL (ref 4.22–5.81)
RDW: 11.9 % (ref 11.5–15.5)
WBC: 9.2 10*3/uL (ref 4.0–10.5)
nRBC: 0 % (ref 0.0–0.2)

## 2019-11-06 LAB — PROTIME-INR
INR: 0.9 (ref 0.8–1.2)
Prothrombin Time: 12.3 seconds (ref 11.4–15.2)

## 2019-11-06 LAB — APTT: aPTT: 45 seconds — ABNORMAL HIGH (ref 24–36)

## 2019-11-06 LAB — HEPARIN LEVEL (UNFRACTIONATED): Heparin Unfractionated: 0.35 IU/mL (ref 0.30–0.70)

## 2019-11-06 MED ORDER — IBUPROFEN 200 MG PO TABS
400.0000 mg | ORAL_TABLET | Freq: Two times a day (BID) | ORAL | Status: DC
  Administered 2019-11-06: 400 mg via ORAL
  Filled 2019-11-06: qty 2

## 2019-11-06 MED ORDER — ATENOLOL 25 MG PO TABS: 50.0000 mg | ORAL_TABLET | Freq: Every day | ORAL | Status: DC

## 2019-11-06 MED ORDER — BISACODYL 5 MG PO TBEC
5.0000 mg | DELAYED_RELEASE_TABLET | Freq: Once | ORAL | Status: AC
  Administered 2019-11-06: 5 mg via ORAL
  Filled 2019-11-06: qty 1

## 2019-11-06 MED ORDER — METOPROLOL TARTRATE 12.5 MG HALF TABLET
12.5000 mg | ORAL_TABLET | Freq: Once | ORAL | Status: AC
  Administered 2019-11-07: 12.5 mg via ORAL
  Filled 2019-11-06: qty 1

## 2019-11-06 MED ORDER — COLCHICINE 0.6 MG PO TABS
0.6000 mg | ORAL_TABLET | Freq: Two times a day (BID) | ORAL | Status: DC
  Administered 2019-11-06 – 2019-11-11 (×9): 0.6 mg via ORAL
  Filled 2019-11-06 (×9): qty 1

## 2019-11-06 MED ORDER — PANTOPRAZOLE SODIUM 40 MG PO TBEC
40.0000 mg | DELAYED_RELEASE_TABLET | Freq: Every day | ORAL | Status: DC
  Administered 2019-11-06: 40 mg via ORAL
  Filled 2019-11-06: qty 1

## 2019-11-06 MED ORDER — DIAZEPAM 5 MG PO TABS
5.0000 mg | ORAL_TABLET | Freq: Once | ORAL | Status: AC
  Administered 2019-11-07: 5 mg via ORAL
  Filled 2019-11-06: qty 1

## 2019-11-06 MED ORDER — SODIUM CHLORIDE 0.9 % IV SOLN: INTRAVENOUS | Status: DC

## 2019-11-06 MED ORDER — CHLORHEXIDINE GLUCONATE CLOTH 2 % EX PADS: 6.0000 | MEDICATED_PAD | Freq: Once | CUTANEOUS | Status: DC

## 2019-11-06 MED ORDER — KETOROLAC TROMETHAMINE 15 MG/ML IJ SOLN
15.0000 mg | Freq: Once | INTRAMUSCULAR | Status: AC
  Administered 2019-11-06: 15 mg via INTRAVENOUS
  Filled 2019-11-06: qty 1

## 2019-11-06 MED ORDER — CHLORHEXIDINE GLUCONATE 0.12 % MT SOLN
15.0000 mL | Freq: Once | OROMUCOSAL | Status: AC
  Administered 2019-11-07: 15 mL via OROMUCOSAL
  Filled 2019-11-06: qty 15

## 2019-11-06 MED ORDER — ZOLPIDEM TARTRATE 5 MG PO TABS
2.5000 mg | ORAL_TABLET | Freq: Every evening | ORAL | Status: DC | PRN
  Administered 2019-11-06: 5 mg via ORAL
  Filled 2019-11-06: qty 1

## 2019-11-06 MED ORDER — ATENOLOL 25 MG PO TABS
50.0000 mg | ORAL_TABLET | Freq: Every day | ORAL | Status: DC
  Administered 2019-11-06: 50 mg via ORAL
  Filled 2019-11-06: qty 2

## 2019-11-06 MED ORDER — CHLORHEXIDINE GLUCONATE CLOTH 2 % EX PADS
6.0000 | MEDICATED_PAD | Freq: Once | CUTANEOUS | Status: AC
  Administered 2019-11-06: 6 via TOPICAL

## 2019-11-06 NOTE — Progress Notes (Signed)
ANTICOAGULATION CONSULT NOTE  Pharmacy Consult for heparin Indication: chest pain/ACS  No Known Allergies  Patient Measurements: Height: 5\' 9"  (175.3 cm) Weight: 110.6 kg (243 lb 12.8 oz) IBW/kg (Calculated) : 70.7 Heparin Dosing Weight: 96 kg  Vital Signs: Temp: 98.2 F (36.8 C) (04/04 0449) Temp Source: Oral (04/04 0449) BP: 148/88 (04/04 0449) Pulse Rate: 74 (04/04 0449)  Labs: Recent Labs    11/03/19 1430 11/03/19 1430 11/04/19 0324 11/04/19 0324 11/04/19 0431 11/04/19 0431 11/04/19 0600 11/04/19 1812 11/05/19 0403 11/05/19 1241 11/06/19 0418  HGB 14.8   < > 14.8   < > 13.6   < >  --  15.5  --   --  14.8  HCT 42.2   < > 43.0   < > 40.0  --   --  44.6  --   --  42.5  PLT 203   < > 189  --   --   --   --  208  --   --  181  APTT 27  --   --   --   --   --   --   --   --   --   --   LABPROT 13.8  --   --   --   --   --   --   --   --   --   --   INR 1.1  --   --   --   --   --   --   --   --   --   --   HEPARINUNFRC  --   --  0.21*   < >  --   --   --   --  0.26* 0.43 0.35  CREATININE 0.98   < > 1.07  --   --    < >  --  1.12 1.09  --  1.04  TROPONINIHS 67*  --   --   --   --   --  924*  --   --   --   --    < > = values in this interval not displayed.    Estimated Creatinine Clearance: 98.4 mL/min (by C-G formula based on SCr of 1.04 mg/dL).   Medical History: Past Medical History:  Diagnosis Date  . Anxiety   . Former smoker   . Hypertension   . Mixed hyperlipidemia     Medications:  Scheduled:  . Chlorhexidine Gluconate Cloth  6 each Topical Once  . colchicine  0.6 mg Oral Daily  . [START ON 11/07/2019] epinephrine  0-10 mcg/min Intravenous To OR  . folic acid  1 mg Oral Daily  . [START ON 11/07/2019] heparin-papaverine-plasmalyte irrigation   Irrigation To OR  . [START ON 11/07/2019] insulin   Intravenous To OR  . [place] Blood Cardioplegia vial (lidocaine/magnesium/mannitol 0.26g-4g-6.4g)   Intracoronary Once  . lidocaine  20 mL Intradermal Once   . [START ON 11/07/2019] magnesium sulfate  40 mEq Other To OR  . metoprolol tartrate  25 mg Oral BID  . multivitamin with minerals  1 tablet Oral Daily  . [START ON 11/07/2019] phenylephrine  30-200 mcg/min Intravenous To OR  . [START ON 11/07/2019] potassium chloride  80 mEq Other To OR  . sodium chloride flush  3 mL Intravenous Q12H  . thiamine  100 mg Oral Daily   Or  . thiamine  100 mg Intravenous Daily  . [START ON 11/07/2019] tranexamic acid  15  mg/kg Intravenous To OR  . [START ON 11/07/2019] tranexamic acid  2 mg/kg Intracatheter To OR   Infusions:  . sodium chloride    . [START ON 11/07/2019] cefUROXime (ZINACEF)  IV    . [START ON 11/07/2019] cefUROXime (ZINACEF)  IV    . [START ON 11/07/2019] dexmedetomidine    . [START ON 11/07/2019] heparin 30,000 units/NS 1000 mL solution for CELLSAVER    . heparin 1,600 Units/hr (11/06/19 0458)  . [START ON 11/07/2019] milrinone    . nitroGLYCERIN 10 mcg/min (11/05/19 2237)  . [START ON 11/07/2019] nitroGLYCERIN    . [START ON 11/07/2019] norepinephrine    . [START ON 11/07/2019] tranexamic acid (CYKLOKAPRON) infusion ([facility])    . [START ON 11/07/2019] vancomycin      Assessment: 54 yo M starting on heparin for CP/ACS s/p LHC found to have severe RCA, LAD, mod LCx disease pending TCTS consult. Pharmacy consulted to start heparin 8 hours after sheath removal. Sheath removal time not documented in cath report but case ended at 15:15. Bedside RN not sure of removal time but arrived to unit with TR band, no sheath, and significant hematoma in R wrist. Pre-cath H/H , plt wnl.  Heparin level remains therapeutic. CBC stable and wnl.   Goal of Therapy:  Heparin level 0.3-0.7 units/ml Monitor platelets by anticoagulation protocol: Yes   Plan:  -Continue heparin at 1600 units/hr -Daily heparin level and CBC -CABG on Monday  Vertis Kelch, PharmD, [facility] PGY2 Cardiology Pharmacy Resident Phone [phone] 11/06/2019       7:45 AM  Please check [email or web address] for  unit-specific pharmacist phone numbers

## 2019-11-06 NOTE — H&P (View-Only) (Signed)
3 Days Post-Op Procedure(s) (LRB): LEFT HEART CATH AND CORONARY ANGIOGRAPHY (N/A) Subjective: C/o right knee pain  Objective: Vital signs in last 24 hours: Temp:  [98.2 F (36.8 C)-98.4 F (36.9 C)] 98.2 F (36.8 C) (04/04 0449) Pulse Rate:  [74-93] 74 (04/04 0449) Cardiac Rhythm: Normal sinus rhythm (04/04 0700) Resp:  [20] 20 (04/04 0449) BP: (132-148)/(71-88) 148/88 (04/04 0449) Weight:  [110.6 kg] 110.6 kg (04/04 0449)  Hemodynamic parameters for last 24 hours:    Intake/Output from previous day: No intake/output data recorded. Intake/Output this shift: No intake/output data recorded.  General appearance: alert, cooperative and no distress Neurologic: intact Heart: regular rate and rhythm Lungs: clear to auscultation bilaterally Extremities: Right knee swollen, tender, less red  Lab Results: Recent Labs    11/04/19 1812 11/06/19 0418  WBC 11.0* 9.2  HGB 15.5 14.8  HCT 44.6 42.5  PLT 208 181   BMET:  Recent Labs    11/05/19 0403 11/06/19 0418  NA 141 139  K 3.6 3.5  CL 102 100  CO2 24 24  GLUCOSE 121* 122*  BUN 6 6  CREATININE 1.09 1.04  CALCIUM 9.6 9.3    PT/INR:  Recent Labs    11/03/19 1430  LABPROT 13.8  INR 1.1   ABG    Component Value Date/Time   PHART 7.470 (H) 11/04/2019 0431   HCO3 24.7 11/04/2019 0431   TCO2 26 11/04/2019 0431   O2SAT 91.0 11/04/2019 0431   CBG (last 3)  No results for input(s): GLUCAP in the last 72 hours.  Assessment/Plan: S/P Procedure(s) (LRB): LEFT HEART CATH AND CORONARY ANGIOGRAPHY (N/A) -severe 2 vessel CAD- For CABG tomorrow 2nd case Right knee- gout- + crystals on micro- will give toradol x 1, increase colchicine to BID today Will plan to proceed with CABG tomorrow     LOS: 3 days    Loreli Slot 11/06/2019

## 2019-11-06 NOTE — Progress Notes (Signed)
Subjective:  No chest pain S/p right knee arthrocentesis 4/3. Continues to have severe right pain.  Objective:  Vital Signs in the last 24 hours: Temp:  [98.2 F (36.8 C)-98.4 F (36.9 C)] 98.2 F (36.8 C) (04/04 0449) Pulse Rate:  [74-93] 74 (04/04 0449) Resp:  [20] 20 (04/04 0449) BP: (132-148)/(71-88) 148/88 (04/04 0449) Weight:  [110.6 kg] 110.6 kg (04/04 0449)  Intake/Output from previous day: No intake/output data recorded.  Physical Exam  Constitutional: He is oriented to person, place, and time. He appears well-developed and well-nourished. No distress.  HENT:  Head: Normocephalic and atraumatic.  Eyes: Pupils are equal, round, and reactive to light. Conjunctivae are normal.  Neck: No JVD present.  Cardiovascular: Normal rate, regular rhythm and normal heart sounds.  No murmur heard. Pulses:      Femoral pulses are 2+ on the right side and 2+ on the left side.      Popliteal pulses are 0 on the right side and 1+ on the left side.       Dorsalis pedis pulses are 0 on the right side and 0 on the left side.       Posterior tibial pulses are 0 on the right side and 0 on the left side.  Pulmonary/Chest: Effort normal and breath sounds normal. He has no wheezes. He has no rales.  Abdominal: Soft. Bowel sounds are normal. There is no rebound.  Musculoskeletal:        General: No edema.     Right knee: Swelling, effusion and erythema present. Decreased range of motion.  Lymphadenopathy:    He has no cervical adenopathy.  Neurological: He is alert and oriented to person, place, and time. No cranial nerve deficit.  Skin: Skin is warm and dry.  Psychiatric: He has a normal mood and affect.  Nursing note and vitals reviewed.    Lab Results: BMP Recent Labs    11/04/19 1812 11/05/19 0403 11/06/19 0418  NA 139 141 139  K 3.8 3.6 3.5  CL 101 102 100  CO2 27 24 24   GLUCOSE 113* 121* 122*  BUN 8 6 6   CREATININE 1.12 1.09 1.04  CALCIUM 9.5 9.6 9.3  GFRNONAA >60 >60  >60  GFRAA >60 >60 >60    CBC Recent Labs  Lab 11/06/19 0418  WBC 9.2  RBC 4.58  HGB 14.8  HCT 42.5  PLT 181  MCV 92.8  MCH 32.3  MCHC 34.8  RDW 11.9    HEMOGLOBIN A1C Lab Results  Component Value Date   HGBA1C 5.5 11/03/2019   MPG 111.15 11/03/2019   Results for ALTUS, ZAINO (MRN [id number]) as of 11/04/2019 08:49  Ref. Range 11/03/2019 14:30 11/04/2019 06:00  Troponin I (High Sensitivity) Latest Ref Range: <18 ng/L 67 (H) 924 (HH)   Lipid Panel     Component Value Date/Time   CHOL 144 11/04/2019 0324   TRIG 317 (H) 11/04/2019 0324   HDL 54 11/04/2019 0324   CHOLHDL 2.7 11/04/2019 0324   VLDL 63 (H) 11/04/2019 0324   LDLCALC 27 11/04/2019 0324     Hepatic Function Panel Recent Labs    11/03/19 1430 11/04/19 1812  PROT 6.2* 6.8  ALBUMIN 3.8 4.0  AST 34 34  ALT 36 33  ALKPHOS 36* 42  BILITOT 0.9 1.2    Results for OLAN, KUREK (MRN [id number]) as of 11/06/2019 12:27  Ref. Range 11/05/2019 13:00  Color, Synovial Latest Ref Range: YELLOW  YELLOW  Appearance-Synovial Latest Ref Range: CLEAR  CLOUDY (A)  Crystals, Fluid Unknown INTRACELLULAR MONOSODIUM URATE CRYSTALS  WBC, Synovial Latest Ref Range: 0 - 200 /cu mm 34,000 (H)  Neutrophil, Synovial Latest Ref Range: 0 - 25 % 86 (H)  Monocyte-Macrophage-Synovial Fluid Latest Ref Range: 50 - 90 % 14 (L)   Cardiovascular Studies:  Echocardiogram 11/04/2019: 1. Mild LVH. Normal LV size. Mild global hypokinesis. LVEF 45-50%. Normal  diastolic function.  2. Normal RV size and function.  3. No significant valvular abnormality.  4. Normal right atrial pressure.   LEA duplex 11/04/2019: Patent distal abdominal aorta and bilateral iliac arteries without  evidence of significant stenosis.  Right: 75-99% stenosis noted in the superficial femoral artery. Posterior  tibial artery appears occluded.  Left: 75-99% stenosis noted in the superficial femoral artery. Posterior  tibial artery appears occluded.    Coronary angiogram 11/03/2019: LM: Normal LAD: Severe ostial to mid calcification with long 50-75% calcified lesion upto mid LAD. Distal vessel is spared of calcification, with excellent distal surgical target.  LCx: Mid calcific 50% stenosis. RCA: Severely calcified prox CTO with a narrow antegrade collateral, adequate to maintain TIMI III flow at rest, follwowed by log mid 60-80% long calcified lesion. Distal vessel is spared of calcification, with excellent distal surgical target.   LVEDP 20 mmHg  EKG 11/03/2019: Sinus rhythm. Nonspecific ST-T changes. EMS EKG showed transient ST elevation, inferior leads.   CT cardiac scoring 10/24/2019: TOTAL CALCIUM SCORE :  5002 LEFT MAIN:126 LAD:1634 CIRCUMFLEX: 633 RCA: 2609  Assessment & Recommendations:  54 y/o Caucasian male with hypertension, hyperlipidemia, former smoker, h/o EtOH overuse, h/o gout, admitted with acute coronary syndrome  Acute gout exacerbation: Monosodium urate crystals. No e/o infection. Conintue colchicine, increased to 0.6 mg bid. + Protonix 50 mg daily. Ketorolac IM 15 mg once. Ibuprofen 400 mg bid later this afternoon. (Benefits outweigh risks). IV fluids 50 mg/hr Will hold Lipitor for now. ?gout attack etiology  Acute coronary syndrome: Transient inferior ST elevation, currently resolved. Chest pain free Trop HS peak 924. Severe RCA, LAD, mod LCx disease. EF 45-50% with mild global hypokinesis. Lipid panel unremarkable. A1C normal. Lipoprotein (a) pending.  Continue Aspirin/heparin/IV NTG/beta blocker. Will hold Lipitor for now. ?gout attack etiology Awaiting CABG 4/5.  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  H/o EtOH overuse: CIWA protocol  Elder Negus, M.D. [facility] Pager: [phone] Office: [phone]

## 2019-11-06 NOTE — Progress Notes (Signed)
3 Days Post-Op Procedure(s) (LRB): LEFT HEART CATH AND CORONARY ANGIOGRAPHY (N/A) Subjective: C/o right knee pain  Objective: Vital signs in last 24 hours: Temp:  [98.2 F (36.8 C)-98.4 F (36.9 C)] 98.2 F (36.8 C) (04/04 0449) Pulse Rate:  [74-93] 74 (04/04 0449) Cardiac Rhythm: Normal sinus rhythm (04/04 0700) Resp:  [20] 20 (04/04 0449) BP: (132-148)/(71-88) 148/88 (04/04 0449) Weight:  [110.6 kg] 110.6 kg (04/04 0449)  Hemodynamic parameters for last 24 hours:    Intake/Output from previous day: No intake/output data recorded. Intake/Output this shift: No intake/output data recorded.  General appearance: alert, cooperative and no distress Neurologic: intact Heart: regular rate and rhythm Lungs: clear to auscultation bilaterally Extremities: Right knee swollen, tender, less red  Lab Results: Recent Labs    11/04/19 1812 11/06/19 0418  WBC 11.0* 9.2  HGB 15.5 14.8  HCT 44.6 42.5  PLT 208 181   BMET:  Recent Labs    11/05/19 0403 11/06/19 0418  NA 141 139  K 3.6 3.5  CL 102 100  CO2 24 24  GLUCOSE 121* 122*  BUN 6 6  CREATININE 1.09 1.04  CALCIUM 9.6 9.3    PT/INR:  Recent Labs    11/03/19 1430  LABPROT 13.8  INR 1.1   ABG    Component Value Date/Time   PHART 7.470 (H) 11/04/2019 0431   HCO3 24.7 11/04/2019 0431   TCO2 26 11/04/2019 0431   O2SAT 91.0 11/04/2019 0431   CBG (last 3)  No results for input(s): GLUCAP in the last 72 hours.  Assessment/Plan: S/P Procedure(s) (LRB): LEFT HEART CATH AND CORONARY ANGIOGRAPHY (N/A) -severe 2 vessel CAD- For CABG tomorrow 2nd case Right knee- gout- + crystals on micro- will give toradol x 1, increase colchicine to BID today Will plan to proceed with CABG tomorrow     LOS: 3 days    Dock Baccam C [place] Huberty 11/06/2019  

## 2019-11-07 ENCOUNTER — Inpatient Hospital Stay (HOSPITAL_COMMUNITY)
Admission: AD | Disposition: A | Discharge status: Home / Self Care | Payer: Self-pay | Source: Ambulatory Visit | Attending: Cardiology

## 2019-11-07 ENCOUNTER — Inpatient Hospital Stay (HOSPITAL_COMMUNITY): Payer: BC Managed Care – PPO | Admitting: Anesthesiology

## 2019-11-07 ENCOUNTER — Inpatient Hospital Stay (HOSPITAL_COMMUNITY): Payer: BC Managed Care – PPO

## 2019-11-07 ENCOUNTER — Encounter (HOSPITAL_COMMUNITY)
Admission: AD | Disposition: A | Discharge status: Home / Self Care | Payer: Self-pay | Source: Ambulatory Visit | Attending: Cardiology

## 2019-11-07 ENCOUNTER — Encounter (HOSPITAL_COMMUNITY): Payer: Self-pay | Admitting: Cardiology

## 2019-11-07 HISTORY — PX: TEE WITHOUT CARDIOVERSION: SHX5443

## 2019-11-07 HISTORY — PX: CORONARY ARTERY BYPASS GRAFT: SHX141

## 2019-11-07 LAB — POCT I-STAT 7, (LYTES, BLD GAS, ICA,H+H)
Acid-base deficit: 2 mmol/L (ref 0.0–2.0)
Bicarbonate: 26.7 mmol/L (ref 20.0–28.0)
Bicarbonate: 27.1 mmol/L (ref 20.0–28.0)
Bicarbonate: 24.4 mmol/L (ref 20.0–28.0)
Bicarbonate: 26 mmol/L (ref 20.0–28.0)
Calcium, Ion: 1.23 mmol/L (ref 1.15–1.40)
Calcium, Ion: 1.07 mmol/L — ABNORMAL LOW (ref 1.15–1.40)
Calcium, Ion: 1.1 mmol/L — ABNORMAL LOW (ref 1.15–1.40)
Calcium, Ion: 1.12 mmol/L — ABNORMAL LOW (ref 1.15–1.40)
HCT: 39 % (ref 39.0–52.0)
HCT: 33 % — ABNORMAL LOW (ref 39.0–52.0)
HCT: 32 % — ABNORMAL LOW (ref 39.0–52.0)
HCT: 32 % — ABNORMAL LOW (ref 39.0–52.0)
Hemoglobin: 13.3 g/dL (ref 13.0–17.0)
Hemoglobin: 11.2 g/dL — ABNORMAL LOW (ref 13.0–17.0)
Hemoglobin: 10.9 g/dL — ABNORMAL LOW (ref 13.0–17.0)
Hemoglobin: 10.9 g/dL — ABNORMAL LOW (ref 13.0–17.0)
O2 Saturation: 100 %
O2 Saturation: 100 %
O2 Saturation: 100 %
O2 Saturation: 95 %
Potassium: 3.4 mmol/L — ABNORMAL LOW (ref 3.5–5.1)
Potassium: 4 mmol/L (ref 3.5–5.1)
Potassium: 4.4 mmol/L (ref 3.5–5.1)
Potassium: 5 mmol/L (ref 3.5–5.1)
Sodium: 139 mmol/L (ref 135–145)
Sodium: 139 mmol/L (ref 135–145)
Sodium: 137 mmol/L (ref 135–145)
Sodium: 139 mmol/L (ref 135–145)
TCO2: 28 mmol/L (ref 22–32)
TCO2: 29 mmol/L (ref 22–32)
TCO2: 26 mmol/L (ref 22–32)
TCO2: 27 mmol/L (ref 22–32)
pCO2 arterial: 48.9 mmHg — ABNORMAL HIGH (ref 32.0–48.0)
pCO2 arterial: 52.3 mmHg — ABNORMAL HIGH (ref 32.0–48.0)
pCO2 arterial: 45.3 mmHg (ref 32.0–48.0)
pCO2 arterial: 48.7 mmHg — ABNORMAL HIGH (ref 32.0–48.0)
pH, Arterial: 7.345 — ABNORMAL LOW (ref 7.350–7.450)
pH, Arterial: 7.323 — ABNORMAL LOW (ref 7.350–7.450)
pH, Arterial: 7.308 — ABNORMAL LOW (ref 7.350–7.450)
pH, Arterial: 7.366 (ref 7.350–7.450)
pO2, Arterial: 227 mmHg — ABNORMAL HIGH (ref 83.0–108.0)
pO2, Arterial: 319 mmHg — ABNORMAL HIGH (ref 83.0–108.0)
pO2, Arterial: 274 mmHg — ABNORMAL HIGH (ref 83.0–108.0)
pO2, Arterial: 84 mmHg (ref 83.0–108.0)

## 2019-11-07 LAB — BASIC METABOLIC PANEL
Anion gap: 14 (ref 5–15)
BUN: 10 mg/dL (ref 6–20)
CO2: 25 mmol/L (ref 22–32)
Calcium: 9 mg/dL (ref 8.9–10.3)
Chloride: 101 mmol/L (ref 98–111)
Creatinine, Ser: 1.15 mg/dL (ref 0.61–1.24)
GFR calc Af Amer: >60 mL/min (ref >60)
GFR calc non Af Amer: >60 mL/min (ref >60)
Glucose, Bld: 125 mg/dL — ABNORMAL HIGH (ref 70–99)
Potassium: 3.5 mmol/L (ref 3.5–5.1)
Sodium: 140 mmol/L (ref 135–145)

## 2019-11-07 LAB — COMPREHENSIVE METABOLIC PANEL
ALT: 27 U/L (ref 0–44)
AST: 33 U/L (ref 15–41)
Albumin: 2.6 g/dL — ABNORMAL LOW (ref 3.5–5.0)
Alkaline Phosphatase: 40 U/L (ref 38–126)
Anion gap: 9 (ref 5–15)
BUN: 13 mg/dL (ref 6–20)
CO2: 20 mmol/L — ABNORMAL LOW (ref 22–32)
Calcium: 7.7 mg/dL — ABNORMAL LOW (ref 8.9–10.3)
Chloride: 109 mmol/L (ref 98–111)
Creatinine, Ser: 1.06 mg/dL (ref 0.61–1.24)
GFR calc Af Amer: >60 mL/min (ref >60)
GFR calc non Af Amer: >60 mL/min (ref >60)
Glucose, Bld: 134 mg/dL — ABNORMAL HIGH (ref 70–99)
Potassium: 4.2 mmol/L (ref 3.5–5.1)
Sodium: 138 mmol/L (ref 135–145)
Total Bilirubin: 1.3 mg/dL — ABNORMAL HIGH (ref 0.3–1.2)
Total Protein: 5.7 g/dL — ABNORMAL LOW (ref 6.5–8.1)

## 2019-11-07 LAB — POCT I-STAT, CHEM 8
BUN: 10 mg/dL (ref 6–20)
BUN: 10 mg/dL (ref 6–20)
BUN: 11 mg/dL (ref 6–20)
BUN: 9 mg/dL (ref 6–20)
BUN: 11 mg/dL (ref 6–20)
BUN: 12 mg/dL (ref 6–20)
Calcium, Ion: 1.23 mmol/L (ref 1.15–1.40)
Calcium, Ion: 1.18 mmol/L (ref 1.15–1.40)
Calcium, Ion: 1.05 mmol/L — ABNORMAL LOW (ref 1.15–1.40)
Calcium, Ion: 1.13 mmol/L — ABNORMAL LOW (ref 1.15–1.40)
Calcium, Ion: 1.11 mmol/L — ABNORMAL LOW (ref 1.15–1.40)
Calcium, Ion: 1.13 mmol/L — ABNORMAL LOW (ref 1.15–1.40)
Chloride: 101 mmol/L (ref 98–111)
Chloride: 104 mmol/L (ref 98–111)
Chloride: 100 mmol/L (ref 98–111)
Chloride: 103 mmol/L (ref 98–111)
Chloride: 102 mmol/L (ref 98–111)
Chloride: 103 mmol/L (ref 98–111)
Creatinine, Ser: 0.9 mg/dL (ref 0.61–1.24)
Creatinine, Ser: 0.8 mg/dL (ref 0.61–1.24)
Creatinine, Ser: 0.8 mg/dL (ref 0.61–1.24)
Creatinine, Ser: 1 mg/dL (ref 0.61–1.24)
Creatinine, Ser: 0.9 mg/dL (ref 0.61–1.24)
Creatinine, Ser: 1 mg/dL (ref 0.61–1.24)
Glucose, Bld: 118 mg/dL — ABNORMAL HIGH (ref 70–99)
Glucose, Bld: 125 mg/dL — ABNORMAL HIGH (ref 70–99)
Glucose, Bld: 119 mg/dL — ABNORMAL HIGH (ref 70–99)
Glucose, Bld: 135 mg/dL — ABNORMAL HIGH (ref 70–99)
Glucose, Bld: 145 mg/dL — ABNORMAL HIGH (ref 70–99)
Glucose, Bld: 147 mg/dL — ABNORMAL HIGH (ref 70–99)
HCT: 40 % (ref 39.0–52.0)
HCT: 38 % — ABNORMAL LOW (ref 39.0–52.0)
HCT: 31 % — ABNORMAL LOW (ref 39.0–52.0)
HCT: 33 % — ABNORMAL LOW (ref 39.0–52.0)
HCT: 32 % — ABNORMAL LOW (ref 39.0–52.0)
HCT: 33 % — ABNORMAL LOW (ref 39.0–52.0)
Hemoglobin: 13.6 g/dL (ref 13.0–17.0)
Hemoglobin: 12.9 g/dL — ABNORMAL LOW (ref 13.0–17.0)
Hemoglobin: 10.5 g/dL — ABNORMAL LOW (ref 13.0–17.0)
Hemoglobin: 11.2 g/dL — ABNORMAL LOW (ref 13.0–17.0)
Hemoglobin: 10.9 g/dL — ABNORMAL LOW (ref 13.0–17.0)
Hemoglobin: 11.2 g/dL — ABNORMAL LOW (ref 13.0–17.0)
Potassium: 3.4 mmol/L — ABNORMAL LOW (ref 3.5–5.1)
Potassium: 3.9 mmol/L (ref 3.5–5.1)
Potassium: 4 mmol/L (ref 3.5–5.1)
Potassium: 4.7 mmol/L (ref 3.5–5.1)
Potassium: 4.3 mmol/L (ref 3.5–5.1)
Potassium: 4.8 mmol/L (ref 3.5–5.1)
Sodium: 139 mmol/L (ref 135–145)
Sodium: 137 mmol/L (ref 135–145)
Sodium: 137 mmol/L (ref 135–145)
Sodium: 138 mmol/L (ref 135–145)
Sodium: 137 mmol/L (ref 135–145)
Sodium: 139 mmol/L (ref 135–145)
TCO2: 27 mmol/L (ref 22–32)
TCO2: 27 mmol/L (ref 22–32)
TCO2: 28 mmol/L (ref 22–32)
TCO2: 29 mmol/L (ref 22–32)
TCO2: 25 mmol/L (ref 22–32)
TCO2: 27 mmol/L (ref 22–32)

## 2019-11-07 LAB — GLUCOSE, CAPILLARY
Glucose-Capillary: 144 mg/dL — ABNORMAL HIGH (ref 70–99)
Glucose-Capillary: 136 mg/dL — ABNORMAL HIGH (ref 70–99)
Glucose-Capillary: 145 mg/dL — ABNORMAL HIGH (ref 70–99)
Glucose-Capillary: 136 mg/dL — ABNORMAL HIGH (ref 70–99)
Glucose-Capillary: 128 mg/dL — ABNORMAL HIGH (ref 70–99)
Glucose-Capillary: 137 mg/dL — ABNORMAL HIGH (ref 70–99)
Glucose-Capillary: 138 mg/dL — ABNORMAL HIGH (ref 70–99)
Glucose-Capillary: 158 mg/dL — ABNORMAL HIGH (ref 70–99)
Glucose-Capillary: 119 mg/dL — ABNORMAL HIGH (ref 70–99)
Glucose-Capillary: 139 mg/dL — ABNORMAL HIGH (ref 70–99)

## 2019-11-07 LAB — APTT: aPTT: 29 seconds (ref 24–36)

## 2019-11-07 LAB — CBC WITH DIFFERENTIAL/PLATELET
Abs Immature Granulocytes: 0.02 10*3/uL (ref 0.00–0.07)
Basophils Absolute: 0 10*3/uL (ref 0.0–0.1)
Basophils Relative: 0 %
Eosinophils Absolute: 0 10*3/uL (ref 0.0–0.5)
Eosinophils Relative: 0 %
HCT: 38.4 % — ABNORMAL LOW (ref 39.0–52.0)
Hemoglobin: 13.3 g/dL (ref 13.0–17.0)
Immature Granulocytes: 0 %
Lymphocytes Relative: 12 %
Lymphs Abs: 1 10*3/uL (ref 0.7–4.0)
MCH: 32.8 pg (ref 26.0–34.0)
MCHC: 34.6 g/dL (ref 30.0–36.0)
MCV: 94.6 fL (ref 80.0–100.0)
Monocytes Absolute: 1.1 10*3/uL — ABNORMAL HIGH (ref 0.1–1.0)
Monocytes Relative: 12 %
Neutro Abs: 6.6 10*3/uL (ref 1.7–7.7)
Neutrophils Relative %: 76 %
Platelets: 174 10*3/uL (ref 150–400)
RBC: 4.06 MIL/uL — ABNORMAL LOW (ref 4.22–5.81)
RDW: 11.9 % (ref 11.5–15.5)
WBC: 8.7 10*3/uL (ref 4.0–10.5)
nRBC: 0 % (ref 0.0–0.2)

## 2019-11-07 LAB — HEPARIN LEVEL (UNFRACTIONATED): Heparin Unfractionated: 0.18 IU/mL — ABNORMAL LOW (ref 0.30–0.70)

## 2019-11-07 LAB — PROTIME-INR
INR: 1.3 — ABNORMAL HIGH (ref 0.8–1.2)
Prothrombin Time: 15.7 seconds — ABNORMAL HIGH (ref 11.4–15.2)

## 2019-11-07 LAB — CBC
HCT: 41.8 % (ref 39.0–52.0)
HCT: 39.1 % (ref 39.0–52.0)
Hemoglobin: 14.5 g/dL (ref 13.0–17.0)
Hemoglobin: 13.2 g/dL (ref 13.0–17.0)
MCH: 32.2 pg (ref 26.0–34.0)
MCH: 32.4 pg (ref 26.0–34.0)
MCHC: 34.7 g/dL (ref 30.0–36.0)
MCHC: 33.8 g/dL (ref 30.0–36.0)
MCV: 92.7 fL (ref 80.0–100.0)
MCV: 96.1 fL (ref 80.0–100.0)
Platelets: 193 10*3/uL (ref 150–400)
Platelets: 139 10*3/uL — ABNORMAL LOW (ref 150–400)
RBC: 4.51 MIL/uL (ref 4.22–5.81)
RBC: 4.07 MIL/uL — ABNORMAL LOW (ref 4.22–5.81)
RDW: 11.7 % (ref 11.5–15.5)
RDW: 11.9 % (ref 11.5–15.5)
WBC: 9.8 10*3/uL (ref 4.0–10.5)
WBC: 9.9 10*3/uL (ref 4.0–10.5)
nRBC: 0 % (ref 0.0–0.2)
nRBC: 0 % (ref 0.0–0.2)

## 2019-11-07 LAB — HEMOGLOBIN AND HEMATOCRIT, BLOOD
HCT: 32.5 % — ABNORMAL LOW (ref 39.0–52.0)
Hemoglobin: 11.2 g/dL — ABNORMAL LOW (ref 13.0–17.0)

## 2019-11-07 LAB — ECHO INTRAOPERATIVE TEE
Height: 69 in
Weight: 3937.6 oz

## 2019-11-07 LAB — MAGNESIUM: Magnesium: 2.9 mg/dL — ABNORMAL HIGH (ref 1.7–2.4)

## 2019-11-07 LAB — PHOSPHORUS: Phosphorus: 3.6 mg/dL (ref 2.5–4.6)

## 2019-11-07 LAB — PLATELET COUNT: Platelets: 177 10*3/uL (ref 150–400)

## 2019-11-07 SURGERY — OFF PUMP CORONARY ARTERY BYPASS GRAFTING (CABG)
Anesthesia: General | Site: Chest

## 2019-11-07 SURGERY — CORONARY ARTERY BYPASS GRAFTING (CABG)
Anesthesia: General | Site: Chest

## 2019-11-07 MED ORDER — PLASMA-LYTE 148 IV SOLN
INTRAVENOUS | Status: DC | PRN
  Administered 2019-11-07: 500 mL via INTRAVASCULAR

## 2019-11-07 MED ORDER — SODIUM CHLORIDE (PF) 0.9 % IJ SOLN
OROMUCOSAL | Status: DC | PRN
  Administered 2019-11-07 (×3): 4 mL via TOPICAL

## 2019-11-07 MED ORDER — INSULIN REGULAR(HUMAN) IN NACL 100-0.9 UT/100ML-% IV SOLN
INTRAVENOUS | Status: DC
  Administered 2019-11-07: 1.3 [IU]/h via INTRAVENOUS

## 2019-11-07 MED ORDER — MAGNESIUM SULFATE 4 GM/100ML IV SOLN
4.0000 g | Freq: Once | INTRAVENOUS | Status: AC
  Administered 2019-11-07: 4 g via INTRAVENOUS
  Filled 2019-11-07: qty 100

## 2019-11-07 MED ORDER — FENTANYL CITRATE (PF) 250 MCG/5ML IJ SOLN
INTRAMUSCULAR | Status: DC | PRN
  Administered 2019-11-07: 450 ug via INTRAVENOUS
  Administered 2019-11-07: 50 ug via INTRAVENOUS
  Administered 2019-11-07 (×2): 150 ug via INTRAVENOUS
  Administered 2019-11-07: 100 ug via INTRAVENOUS
  Administered 2019-11-07: 150 ug via INTRAVENOUS
  Administered 2019-11-07: 50 ug via INTRAVENOUS
  Administered 2019-11-07: 100 ug via INTRAVENOUS

## 2019-11-07 MED ORDER — NITROGLYCERIN IN D5W 200-5 MCG/ML-% IV SOLN: 0.0000 ug/min | INTRAVENOUS | Status: DC

## 2019-11-07 MED ORDER — ACETAMINOPHEN 160 MG/5ML PO SOLN: 1000.0000 mg | Freq: Four times a day (QID) | ORAL | Status: DC

## 2019-11-07 MED ORDER — MORPHINE SULFATE (PF) 2 MG/ML IV SOLN
1.0000 mg | INTRAVENOUS | Status: DC | PRN
  Administered 2019-11-07 – 2019-11-08 (×4): 2 mg via INTRAVENOUS
  Filled 2019-11-07 (×4): qty 1

## 2019-11-07 MED ORDER — MIDAZOLAM HCL (PF) 10 MG/2ML IJ SOLN
INTRAMUSCULAR | Status: AC
  Filled 2019-11-07: qty 2

## 2019-11-07 MED ORDER — CHLORHEXIDINE GLUCONATE 0.12 % MT SOLN
15.0000 mL | OROMUCOSAL | Status: AC
  Administered 2019-11-07: 15 mL via OROMUCOSAL

## 2019-11-07 MED ORDER — LACTATED RINGERS IV SOLN: INTRAVENOUS | Status: DC

## 2019-11-07 MED ORDER — PROPOFOL 10 MG/ML IV BOLUS
INTRAVENOUS | Status: AC
  Filled 2019-11-07: qty 20

## 2019-11-07 MED ORDER — LACTATED RINGERS IV SOLN: INTRAVENOUS | Status: DC | PRN

## 2019-11-07 MED ORDER — METOPROLOL TARTRATE 12.5 MG HALF TABLET
12.5000 mg | ORAL_TABLET | Freq: Two times a day (BID) | ORAL | Status: DC
  Administered 2019-11-08 (×2): 12.5 mg via ORAL
  Filled 2019-11-07 (×2): qty 1

## 2019-11-07 MED ORDER — FENTANYL CITRATE (PF) 250 MCG/5ML IJ SOLN
INTRAMUSCULAR | Status: AC
  Filled 2019-11-07: qty 25

## 2019-11-07 MED ORDER — SODIUM CHLORIDE 0.9% FLUSH
3.0000 mL | Freq: Two times a day (BID) | INTRAVENOUS | Status: DC
  Administered 2019-11-08: 3 mL via INTRAVENOUS

## 2019-11-07 MED ORDER — METOCLOPRAMIDE HCL 5 MG/ML IJ SOLN
10.0000 mg | Freq: Four times a day (QID) | INTRAMUSCULAR | Status: AC
  Administered 2019-11-07 – 2019-11-08 (×4): 10 mg via INTRAVENOUS
  Filled 2019-11-07 (×4): qty 2

## 2019-11-07 MED ORDER — DOCUSATE SODIUM 100 MG PO CAPS
200.0000 mg | ORAL_CAPSULE | Freq: Every day | ORAL | Status: DC
  Administered 2019-11-08: 09:00:00 200 mg via ORAL
  Filled 2019-11-07: qty 2

## 2019-11-07 MED ORDER — VANCOMYCIN HCL IN DEXTROSE 1-5 GM/200ML-% IV SOLN
1000.0000 mg | Freq: Once | INTRAVENOUS | Status: AC
  Administered 2019-11-07: 1000 mg via INTRAVENOUS
  Filled 2019-11-07: qty 200

## 2019-11-07 MED ORDER — PHENYLEPHRINE HCL-NACL 20-0.9 MG/250ML-% IV SOLN
INTRAVENOUS | Status: DC | PRN
  Administered 2019-11-07: 25 ug/min via INTRAVENOUS

## 2019-11-07 MED ORDER — MIDAZOLAM HCL 5 MG/5ML IJ SOLN
INTRAMUSCULAR | Status: DC | PRN
  Administered 2019-11-07 (×2): 2 mg via INTRAVENOUS
  Administered 2019-11-07: 3 mg via INTRAVENOUS
  Administered 2019-11-07: 1 mg via INTRAVENOUS

## 2019-11-07 MED ORDER — ARTIFICIAL TEARS OPHTHALMIC OINT
TOPICAL_OINTMENT | OPHTHALMIC | Status: AC
  Filled 2019-11-07: qty 3.5

## 2019-11-07 MED ORDER — ASPIRIN 81 MG PO CHEW: 324.0000 mg | CHEWABLE_TABLET | Freq: Every day | ORAL | Status: DC

## 2019-11-07 MED ORDER — PHENYLEPHRINE HCL-NACL 20-0.9 MG/250ML-% IV SOLN
0.0000 ug/min | INTRAVENOUS | Status: DC
  Administered 2019-11-07: 3 ug/min via INTRAVENOUS

## 2019-11-07 MED ORDER — VANCOMYCIN HCL 1000 MG IV SOLR
INTRAVENOUS | Status: DC | PRN
  Administered 2019-11-07: 09:00:00 1500 mg via INTRAVENOUS

## 2019-11-07 MED ORDER — POTASSIUM CHLORIDE 10 MEQ/50ML IV SOLN: 10.0000 meq | INTRAVENOUS | Status: AC

## 2019-11-07 MED ORDER — HEMOSTATIC AGENTS (NO CHARGE) OPTIME
TOPICAL | Status: DC | PRN
  Administered 2019-11-07: 1 via TOPICAL

## 2019-11-07 MED ORDER — ARTIFICIAL TEARS OPHTHALMIC OINT
TOPICAL_OINTMENT | OPHTHALMIC | Status: DC | PRN
  Administered 2019-11-07: 1 via OPHTHALMIC

## 2019-11-07 MED ORDER — PROTAMINE SULFATE 10 MG/ML IV SOLN
INTRAVENOUS | Status: DC | PRN
  Administered 2019-11-07: 20 mg via INTRAVENOUS
  Administered 2019-11-07: 380 mg via INTRAVENOUS

## 2019-11-07 MED ORDER — DEXMEDETOMIDINE HCL IN NACL 400 MCG/100ML IV SOLN
INTRAVENOUS | Status: DC | PRN
  Administered 2019-11-07: .7 ug/kg/h via INTRAVENOUS

## 2019-11-07 MED ORDER — INSULIN REGULAR(HUMAN) IN NACL 100-0.9 UT/100ML-% IV SOLN
INTRAVENOUS | Status: DC | PRN
  Administered 2019-11-07: 1 [IU]/h via INTRAVENOUS

## 2019-11-07 MED ORDER — TRANEXAMIC ACID 1000 MG/10ML IV SOLN
INTRAVENOUS | Status: DC | PRN
  Administered 2019-11-07: 1.5 mg/kg/h via INTRAVENOUS

## 2019-11-07 MED ORDER — ASPIRIN EC 325 MG PO TBEC
325.0000 mg | DELAYED_RELEASE_TABLET | Freq: Every day | ORAL | Status: DC
  Administered 2019-11-08: 09:00:00 325 mg via ORAL
  Filled 2019-11-07: qty 1

## 2019-11-07 MED ORDER — MIDAZOLAM HCL 2 MG/2ML IJ SOLN: 2.0000 mg | INTRAMUSCULAR | Status: DC | PRN

## 2019-11-07 MED ORDER — CHLORHEXIDINE GLUCONATE CLOTH 2 % EX PADS
6.0000 | MEDICATED_PAD | Freq: Every day | CUTANEOUS | Status: DC
  Administered 2019-11-07 – 2019-11-08 (×2): 6 via TOPICAL

## 2019-11-07 MED ORDER — SODIUM CHLORIDE 0.9 % IV SOLN
INTRAVENOUS | Status: DC | PRN
  Administered 2019-11-07: .75 g via INTRAVENOUS
  Administered 2019-11-07: 09:00:00 1.5 g via INTRAVENOUS

## 2019-11-07 MED ORDER — 0.9 % SODIUM CHLORIDE (POUR BTL) OPTIME
TOPICAL | Status: DC | PRN
  Administered 2019-11-07: 5000 mL

## 2019-11-07 MED ORDER — SODIUM CHLORIDE 0.9% FLUSH: 10.0000 mL | INTRAVENOUS | Status: DC | PRN

## 2019-11-07 MED ORDER — ROCURONIUM BROMIDE 10 MG/ML (PF) SYRINGE
PREFILLED_SYRINGE | INTRAVENOUS | Status: DC | PRN
  Administered 2019-11-07: 100 mg via INTRAVENOUS
  Administered 2019-11-07 (×3): 50 mg via INTRAVENOUS

## 2019-11-07 MED ORDER — TRAMADOL HCL 50 MG PO TABS
50.0000 mg | ORAL_TABLET | ORAL | Status: DC | PRN
  Administered 2019-11-07 – 2019-11-08 (×2): 100 mg via ORAL
  Filled 2019-11-07 (×2): qty 2

## 2019-11-07 MED ORDER — METOPROLOL TARTRATE 5 MG/5ML IV SOLN: 2.5000 mg | INTRAVENOUS | Status: DC | PRN

## 2019-11-07 MED ORDER — PROPOFOL 10 MG/ML IV BOLUS
INTRAVENOUS | Status: DC | PRN
  Administered 2019-11-07: 50 mg via INTRAVENOUS

## 2019-11-07 MED ORDER — SODIUM CHLORIDE 0.45 % IV SOLN: INTRAVENOUS | Status: DC | PRN

## 2019-11-07 MED ORDER — ONDANSETRON HCL 4 MG/2ML IJ SOLN: 4.0000 mg | Freq: Four times a day (QID) | INTRAMUSCULAR | Status: DC | PRN

## 2019-11-07 MED ORDER — SODIUM CHLORIDE (PF) 0.9 % IJ SOLN
INTRAMUSCULAR | Status: AC
  Filled 2019-11-07: qty 10

## 2019-11-07 MED ORDER — FAMOTIDINE IN NACL 20-0.9 MG/50ML-% IV SOLN
20.0000 mg | Freq: Two times a day (BID) | INTRAVENOUS | Status: AC
  Administered 2019-11-07 (×2): 20 mg via INTRAVENOUS
  Filled 2019-11-07: qty 50

## 2019-11-07 MED ORDER — SODIUM CHLORIDE 0.9 % IV SOLN: 250.0000 mL | INTRAVENOUS | Status: DC

## 2019-11-07 MED ORDER — BISACODYL 5 MG PO TBEC
10.0000 mg | DELAYED_RELEASE_TABLET | Freq: Every day | ORAL | Status: DC
  Administered 2019-11-08: 10 mg via ORAL
  Filled 2019-11-07: qty 2

## 2019-11-07 MED ORDER — ALBUMIN HUMAN 5 % IV SOLN: 250.0000 mL | INTRAVENOUS | Status: AC | PRN

## 2019-11-07 MED ORDER — PANTOPRAZOLE SODIUM 40 MG PO TBEC: 40.0000 mg | DELAYED_RELEASE_TABLET | Freq: Every day | ORAL | Status: DC

## 2019-11-07 MED ORDER — DEXMEDETOMIDINE HCL IN NACL 400 MCG/100ML IV SOLN: 0.0000 ug/kg/h | INTRAVENOUS | Status: DC

## 2019-11-07 MED ORDER — HEPARIN SODIUM (PORCINE) 1000 UNIT/ML IJ SOLN
INTRAMUSCULAR | Status: DC | PRN
  Administered 2019-11-07: 35000 [IU] via INTRAVENOUS
  Administered 2019-11-07: 5000 [IU] via INTRAVENOUS

## 2019-11-07 MED ORDER — LACTATED RINGERS IV SOLN: 500.0000 mL | Freq: Once | INTRAVENOUS | Status: DC | PRN

## 2019-11-07 MED ORDER — METOPROLOL TARTRATE 25 MG/10 ML ORAL SUSPENSION: 12.5000 mg | Freq: Two times a day (BID) | ORAL | Status: DC

## 2019-11-07 MED ORDER — ACETAMINOPHEN 500 MG PO TABS
1000.0000 mg | ORAL_TABLET | Freq: Four times a day (QID) | ORAL | Status: DC
  Administered 2019-11-08 – 2019-11-09 (×5): 1000 mg via ORAL
  Filled 2019-11-07 (×6): qty 2

## 2019-11-07 MED ORDER — DEXTROSE 50 % IV SOLN: 0.0000 mL | INTRAVENOUS | Status: DC | PRN

## 2019-11-07 MED ORDER — ACETAMINOPHEN 160 MG/5ML PO SOLN: 650.0000 mg | Freq: Once | ORAL | Status: AC

## 2019-11-07 MED ORDER — SODIUM CHLORIDE 0.9 % IV SOLN: INTRAVENOUS | Status: DC

## 2019-11-07 MED ORDER — SODIUM CHLORIDE 0.9% FLUSH: 3.0000 mL | INTRAVENOUS | Status: DC | PRN

## 2019-11-07 MED ORDER — ROCURONIUM BROMIDE 10 MG/ML (PF) SYRINGE
PREFILLED_SYRINGE | INTRAVENOUS | Status: AC
  Filled 2019-11-07: qty 30

## 2019-11-07 MED ORDER — ACETAMINOPHEN 650 MG RE SUPP
650.0000 mg | Freq: Once | RECTAL | Status: AC
  Administered 2019-11-07: 650 mg via RECTAL

## 2019-11-07 MED ORDER — OXYCODONE HCL 5 MG PO TABS: 5.0000 mg | ORAL_TABLET | ORAL | Status: DC | PRN

## 2019-11-07 MED ORDER — BISACODYL 10 MG RE SUPP: 10.0000 mg | Freq: Every day | RECTAL | Status: DC

## 2019-11-07 MED ORDER — SODIUM CHLORIDE 0.9 % IV SOLN
1.5000 g | Freq: Two times a day (BID) | INTRAVENOUS | Status: DC
  Administered 2019-11-08 (×3): 1.5 g via INTRAVENOUS
  Filled 2019-11-07 (×5): qty 1.5

## 2019-11-07 SURGICAL SUPPLY — 93 items
BAG DECANTER FOR FLEXI CONT (MISCELLANEOUS) ×4 IMPLANT
BASKET HEART  (ORDER IN 25'S) (MISCELLANEOUS) ×1
BASKET HEART (ORDER IN 25'S) (MISCELLANEOUS) ×1
BASKET HEART (ORDER IN 25S) (MISCELLANEOUS) ×2 IMPLANT
BLADE CLIPPER SURG (BLADE) ×4 IMPLANT
BLADE STERNUM SYSTEM 6 (BLADE) ×4 IMPLANT
BNDG ELASTIC 4X5.8 VLCR STR LF (GAUZE/BANDAGES/DRESSINGS) ×2 IMPLANT
BNDG ELASTIC 6X5.8 VLCR STR LF (GAUZE/BANDAGES/DRESSINGS) ×2 IMPLANT
BNDG GAUZE ELAST 4 BULKY (GAUZE/BANDAGES/DRESSINGS) ×2 IMPLANT
CANISTER SUCT 3000ML PPV (MISCELLANEOUS) ×4 IMPLANT
CANNULA EZ GLIDE AORTIC 21FR (CANNULA) ×4 IMPLANT
CATH CPB KIT HENDRICKSON (MISCELLANEOUS) ×4 IMPLANT
CATH ROBINSON RED A/P 18FR (CATHETERS) ×4 IMPLANT
CATH THORACIC 36FR (CATHETERS) ×2 IMPLANT
CATH THORACIC 36FR RT ANG (CATHETERS) ×2 IMPLANT
CLIP VESOCCLUDE MED 24/CT (CLIP) IMPLANT
CLIP VESOCCLUDE SM WIDE 24/CT (CLIP) ×6 IMPLANT
CONN ST 1/4X3/8  BEN (MISCELLANEOUS) ×4
CONN ST 1/4X3/8 BEN (MISCELLANEOUS) IMPLANT
DRAIN CHANNEL 28F RND 3/8 FF (WOUND CARE) ×4 IMPLANT
DRAPE CARDIOVASCULAR INCISE (DRAPES) ×2
DRAPE SLUSH/WARMER DISC (DRAPES) ×4 IMPLANT
DRAPE SRG 135X102X78XABS (DRAPES) ×2 IMPLANT
DRSG COVADERM 4X14 (GAUZE/BANDAGES/DRESSINGS) ×4 IMPLANT
ELECT REM PT RETURN 9FT ADLT (ELECTROSURGICAL) ×8
ELECTRODE REM PT RTRN 9FT ADLT (ELECTROSURGICAL) ×4 IMPLANT
FELT TEFLON 1X6 (MISCELLANEOUS) ×6 IMPLANT
GAUZE SPONGE 4X4 12PLY STRL (GAUZE/BANDAGES/DRESSINGS) ×6 IMPLANT
GLOVE BIO SURGEON STRL SZ 6.5 (GLOVE) ×6 IMPLANT
GLOVE BIO SURGEONS STRL SZ 6.5 (GLOVE) ×6
GLOVE BIOGEL PI IND STRL 6.5 (GLOVE) IMPLANT
GLOVE BIOGEL PI INDICATOR 6.5 (GLOVE) ×8
GLOVE SURG SIGNA 7.5 PF LTX (GLOVE) ×12 IMPLANT
GOWN STRL REUS W/ TWL LRG LVL3 (GOWN DISPOSABLE) ×8 IMPLANT
GOWN STRL REUS W/ TWL XL LVL3 (GOWN DISPOSABLE) ×4 IMPLANT
GOWN STRL REUS W/TWL LRG LVL3 (GOWN DISPOSABLE) ×14
GOWN STRL REUS W/TWL XL LVL3 (GOWN DISPOSABLE) ×4
HEMOSTAT POWDER SURGIFOAM 1G (HEMOSTASIS) ×12 IMPLANT
HEMOSTAT SURGICEL 2X14 (HEMOSTASIS) ×4 IMPLANT
INSERT FOGARTY XLG (MISCELLANEOUS) IMPLANT
KIT BASIN OR (CUSTOM PROCEDURE TRAY) ×4 IMPLANT
KIT CATH SUCT 8FR (CATHETERS) ×2 IMPLANT
KIT SUCTION CATH 14FR (SUCTIONS) ×8 IMPLANT
KIT TURNOVER KIT B (KITS) ×4 IMPLANT
KIT VASOVIEW HEMOPRO 2 VH 4000 (KITS) ×2 IMPLANT
MARKER GRAFT CORONARY BYPASS (MISCELLANEOUS) ×12 IMPLANT
NS IRRIG 1000ML POUR BTL (IV SOLUTION) ×20 IMPLANT
PACK E OPEN HEART (SUTURE) ×4 IMPLANT
PACK OPEN HEART (CUSTOM PROCEDURE TRAY) ×4 IMPLANT
PAD ARMBOARD 7.5X6 YLW CONV (MISCELLANEOUS) ×8 IMPLANT
PAD ELECT DEFIB RADIOL ZOLL (MISCELLANEOUS) ×4 IMPLANT
PENCIL BUTTON HOLSTER BLD 10FT (ELECTRODE) ×4 IMPLANT
POSITIONER HEAD DONUT 9IN (MISCELLANEOUS) ×4 IMPLANT
PUNCH AORTIC ROTATE 4.0MM (MISCELLANEOUS) ×2 IMPLANT
PUNCH AORTIC ROTATE 4.5MM 8IN (MISCELLANEOUS) IMPLANT
PUNCH AORTIC ROTATE 5MM 8IN (MISCELLANEOUS) IMPLANT
RELOAD ENDO GIA 45X2.5 (ENDOMECHANICALS) ×2 IMPLANT
SET CARDIOPLEGIA MPS 5001102 (MISCELLANEOUS) ×2 IMPLANT
SPONGE LAP 4X18 RFD (DISPOSABLE) ×2 IMPLANT
SUT BONE WAX W31G (SUTURE) ×4 IMPLANT
SUT ETHIBOND NAB MH 2-0 36IN (SUTURE) ×2 IMPLANT
SUT MNCRL AB 4-0 PS2 18 (SUTURE) IMPLANT
SUT PROLENE 3 0 SH DA (SUTURE) ×4 IMPLANT
SUT PROLENE 4 0 RB 1 (SUTURE)
SUT PROLENE 4 0 SH DA (SUTURE) IMPLANT
SUT PROLENE 4-0 RB1 .5 CRCL 36 (SUTURE) IMPLANT
SUT PROLENE 6 0 C 1 30 (SUTURE) ×8 IMPLANT
SUT PROLENE 7 0 BV 1 (SUTURE) ×2 IMPLANT
SUT PROLENE 7 0 BV1 MDA (SUTURE) ×4 IMPLANT
SUT PROLENE 8 0 BV175 6 (SUTURE) ×6 IMPLANT
SUT SILK  1 MH (SUTURE) ×4
SUT SILK 1 MH (SUTURE) IMPLANT
SUT STEEL 6MS V (SUTURE) ×4 IMPLANT
SUT STEEL STERNAL CCS#1 18IN (SUTURE) IMPLANT
SUT STEEL SZ 6 DBL 3X14 BALL (SUTURE) ×4 IMPLANT
SUT VIC AB 1 CTX 36 (SUTURE) ×4
SUT VIC AB 1 CTX36XBRD ANBCTR (SUTURE) ×4 IMPLANT
SUT VIC AB 2-0 CT1 27 (SUTURE)
SUT VIC AB 2-0 CT1 TAPERPNT 27 (SUTURE) IMPLANT
SUT VIC AB 2-0 CTX 27 (SUTURE) IMPLANT
SUT VIC AB 3-0 SH 27 (SUTURE)
SUT VIC AB 3-0 SH 27X BRD (SUTURE) IMPLANT
SUT VIC AB 3-0 X1 27 (SUTURE) IMPLANT
SUT VICRYL 4-0 PS2 18IN ABS (SUTURE) IMPLANT
SYSTEM SAHARA CHEST DRAIN ATS (WOUND CARE) ×4 IMPLANT
TAPE CLOTH SURG 4X10 WHT LF (GAUZE/BANDAGES/DRESSINGS) ×2 IMPLANT
TAPE PAPER 3X10 WHT MICROPORE (GAUZE/BANDAGES/DRESSINGS) ×2 IMPLANT
TOWEL GREEN STERILE (TOWEL DISPOSABLE) ×4 IMPLANT
TOWEL GREEN STERILE FF (TOWEL DISPOSABLE) ×4 IMPLANT
TRAY FOLEY SLVR 16FR TEMP STAT (SET/KITS/TRAYS/PACK) ×4 IMPLANT
TUBING LAP HI FLOW INSUFFLATIO (TUBING) ×2 IMPLANT
UNDERPAD 30X30 (UNDERPADS AND DIAPERS) ×4 IMPLANT
WATER STERILE IRR 1000ML POUR (IV SOLUTION) ×8 IMPLANT

## 2019-11-07 NOTE — Anesthesia Procedure Notes (Signed)
Procedure Name: Intubation Date/Time: 11/07/2019 9:06 AM Performed by: Bryson Corona, CRNA Pre-anesthesia Checklist: Patient identified, Emergency Drugs available, Suction available and Patient being monitored Patient Re-evaluated:Patient Re-evaluated prior to induction Oxygen Delivery Method: Circle System Utilized Preoxygenation: Pre-oxygenation with 100% oxygen Induction Type: IV induction Ventilation: Oral airway inserted - appropriate to patient size and Two handed mask ventilation required Laryngoscope Size: Mac and 4 Grade View: Grade I Tube type: Oral Tube size: 8.0 mm Number of attempts: 1 Airway Equipment and Method: Stylet and Oral airway Placement Confirmation: ETT inserted through vocal cords under direct vision,  positive ETCO2 and breath sounds checked- equal and bilateral Secured at: 22 cm Tube secured with: Tape Dental Injury: Teeth and Oropharynx as per pre-operative assessment

## 2019-11-07 NOTE — Anesthesia Preprocedure Evaluation (Addendum)
Anesthesia Evaluation  Patient identified by MRN, date of birth, ID band Patient awake    Reviewed: Allergy & Precautions, H&P , NPO status , Patient's Chart, lab work & pertinent test results, reviewed documented beta blocker date and time   Airway Mallampati: II  TM Distance: >3 FB Neck ROM: Full    Dental no notable dental hx. (+) Teeth Intact, Dental Advisory Given   Pulmonary neg pulmonary ROS, former smoker,    Pulmonary exam normal breath sounds clear to auscultation       Cardiovascular hypertension, Pt. on medications and Pt. on home beta blockers + CAD   Rhythm:Regular Rate:Normal     Neuro/Psych Anxiety negative neurological ROS     GI/Hepatic negative GI ROS, Neg liver ROS,   Endo/Other  negative endocrine ROS  Renal/GU negative Renal ROS  negative genitourinary   Musculoskeletal   Abdominal   Peds  Hematology negative hematology ROS (+)   Anesthesia Other Findings   Reproductive/Obstetrics negative OB ROS                            Anesthesia Physical Anesthesia Plan  ASA: IV  Anesthesia Plan: General   Post-op Pain Management:    Induction: Intravenous  PONV Risk Score and Plan: 2 and Ondansetron and Midazolam  Airway Management Planned: Oral ETT  Additional Equipment: Arterial line, CVP, PA Cath, TEE and Ultrasound Guidance Line Placement  Intra-op Plan:   Post-operative Plan: Post-operative intubation/ventilation  Informed Consent: I have reviewed the patients History and Physical, chart, labs and discussed the procedure including the risks, benefits and alternatives for the proposed anesthesia with the patient or authorized representative who has indicated his/her understanding and acceptance.     Dental advisory given  Plan Discussed with: CRNA  Anesthesia Plan Comments:         Anesthesia Quick Evaluation

## 2019-11-07 NOTE — Progress Notes (Signed)
Dr. Tyrone Sage was notified about the 1 of 4 of the pongs being missing from thermistor on the Swan Ganz catheter; causing the patient's CO/CI and core temperature to be inaccurate. Dr. Tyrone Sage stated that it was ok to hook the patient up to a flow track for CI/CO monitoring.

## 2019-11-07 NOTE — Procedures (Signed)
Extubation Procedure Note  Patient Details:   Name: Liston Thum DOB: Oct 24, 1965 MRN: [id number]   Airway Documentation:    Vent end date: 11/07/19 Vent end time: 1615   Evaluation  O2 sats: stable throughout Complications: No apparent complications Patient did tolerate procedure well. Bilateral Breath Sounds: Clear, Diminished   Yes  Toula Moos 11/07/2019, 4:21 PM

## 2019-11-07 NOTE — Progress Notes (Signed)
Subjective:  No chest pain S/p right knee arthrocentesis 4/3. Knee pain slightly improved, but has pain in left knee as well now.  Objective:  Vital Signs in the last 24 hours: Temp:  [97.9 F (36.6 C)-98.3 F (36.8 C)] 98.2 F (36.8 C) (04/05 0641) Pulse Rate:  [79-101] 91 (04/05 0722) Resp:  [16-19] 16 (04/05 0641) BP: (128-149)/(81-99) 128/99 (04/05 0641) SpO2:  [98 %-100 %] 100 % (04/05 0641) Weight:  [111.6 kg] 111.6 kg (04/05 0641)  Intake/Output from previous day: No intake/output data recorded.  Physical Exam  Constitutional: He is oriented to person, place, and time. He appears well-developed and well-nourished. No distress.  HENT:  Head: Normocephalic and atraumatic.  Eyes: Pupils are equal, round, and reactive to light. Conjunctivae are normal.  Neck: No JVD present.  Cardiovascular: Normal rate, regular rhythm and normal heart sounds.  No murmur heard. Pulses:      Femoral pulses are 2+ on the right side and 2+ on the left side.      Popliteal pulses are 0 on the right side and 1+ on the left side.       Dorsalis pedis pulses are 0 on the right side and 0 on the left side.       Posterior tibial pulses are 0 on the right side and 0 on the left side.  Pulmonary/Chest: Effort normal and breath sounds normal. He has no wheezes. He has no rales.  Abdominal: Soft. Bowel sounds are normal. There is no rebound.  Musculoskeletal:        General: No edema.     Right knee: Swelling, effusion and erythema present. Decreased range of motion.  Lymphadenopathy:    He has no cervical adenopathy.  Neurological: He is alert and oriented to person, place, and time. No cranial nerve deficit.  Skin: Skin is warm and dry.  Psychiatric: He has a normal mood and affect.  Nursing note and vitals reviewed.    Lab Results: BMP Recent Labs    11/05/19 0403 11/06/19 0418 11/07/19 0358  NA 141 139 140  K 3.6 3.5 3.5  CL 102 100 101  CO2 24 24 25   GLUCOSE 121* 122* 125*  BUN  6 6 10   CREATININE 1.09 1.04 1.15  CALCIUM 9.6 9.3 9.0  GFRNONAA >60 >60 >60  GFRAA >60 >60 >60    CBC Recent Labs  Lab 11/07/19 0358  WBC 9.8  RBC 4.51  HGB 14.5  HCT 41.8  PLT 193  MCV 92.7  MCH 32.2  MCHC 34.7  RDW 11.7    HEMOGLOBIN A1C Lab Results  Component Value Date   HGBA1C 5.5 11/03/2019   MPG 111.15 11/03/2019   Results for ROBBERT, LANGLINAIS (MRN 01/03/2020) as of 11/04/2019 08:49  Ref. Range 11/03/2019 14:30 11/04/2019 06:00  Troponin I (High Sensitivity) Latest Ref Range: <18 ng/L 67 (H) 924 (HH)   Lipid Panel     Component Value Date/Time   CHOL 144 11/04/2019 0324   TRIG 317 (H) 11/04/2019 0324   HDL 54 11/04/2019 0324   CHOLHDL 2.7 11/04/2019 0324   VLDL 63 (H) 11/04/2019 0324   LDLCALC 27 11/04/2019 0324     Hepatic Function Panel Recent Labs    11/03/19 1430 11/04/19 1812  PROT 6.2* 6.8  ALBUMIN 3.8 4.0  AST 34 34  ALT 36 33  ALKPHOS 36* 42  BILITOT 0.9 1.2    Results for KEISHAUN, HAZEL (MRN 01/04/20) as of 11/06/2019 12:27  Ref. Range 11/05/2019 13:00  Color, Synovial Latest Ref Range: YELLOW  YELLOW  Appearance-Synovial Latest Ref Range: CLEAR  CLOUDY (A)  Crystals, Fluid Unknown INTRACELLULAR MONOSODIUM URATE CRYSTALS  WBC, Synovial Latest Ref Range: 0 - 200 /cu mm 34,000 (H)  Neutrophil, Synovial Latest Ref Range: 0 - 25 % 86 (H)  Monocyte-Macrophage-Synovial Fluid Latest Ref Range: 50 - 90 % 14 (L)   Cardiovascular Studies:  Echocardiogram 11/04/2019: 1. Mild LVH. Normal LV size. Mild global hypokinesis. LVEF 45-50%. Normal  diastolic function.  2. Normal RV size and function.  3. No significant valvular abnormality.  4. Normal right atrial pressure.   LEA duplex 11/04/2019: Patent distal abdominal aorta and bilateral iliac arteries without  evidence of significant stenosis.  Right: 75-99% stenosis noted in the superficial femoral artery. Posterior  tibial artery appears occluded.  Left: 75-99% stenosis noted in the  superficial femoral artery. Posterior  tibial artery appears occluded.   Coronary angiogram 11/03/2019: LM: Normal LAD: Severe ostial to mid calcification with long 50-75% calcified lesion upto mid LAD. Distal vessel is spared of calcification, with excellent distal surgical target.  LCx: Mid calcific 50% stenosis. RCA: Severely calcified prox CTO with a narrow antegrade collateral, adequate to maintain TIMI III flow at rest, follwowed by log mid 60-80% long calcified lesion. Distal vessel is spared of calcification, with excellent distal surgical target.   LVEDP 20 mmHg  EKG 11/03/2019: Sinus rhythm. Nonspecific ST-T changes. EMS EKG showed transient ST elevation, inferior leads.   CT cardiac scoring 10/24/2019: TOTAL CALCIUM SCORE :  5002 LEFT MAIN:126 LAD:1634 CIRCUMFLEX: 633 RCA: 2609  Assessment & Recommendations:  54 y/o Caucasian male with hypertension, hyperlipidemia, former smoker, h/o EtOH overuse, h/o gout, admitted with acute coronary syndrome  Acute gout exacerbation: Monosodium urate crystals in synovial fluid. No e/o infection. Conintue colchicine, increased to 0.6 mg bid. + Protonix 40 mg daily. Ketorolac IM 15 mg once on 4/4. Currently on Ibuprofen 400 mg bid. (Benefits outweigh risks). IV fluids 50 mg/hr Will hold Lipitor for now. ?gout attack etiology  Acute coronary syndrome: Transient inferior ST elevation, currently resolved. Chest pain free Trop HS peak 924. Severe RCA, LAD, mod LCx disease. EF 45-50% with mild global hypokinesis. Lipid panel unremarkable. A1C normal. Lipoprotein (a) pending.  Continue Aspirin/heparin/IV NTG/beta blocker. Will hold Lipitor for now. ?gout attack etiology CABG 4/5.  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  H/o EtOH overuse: CIWA protocol  Nigel Mormon, M.D. [facility],  [place] Pager: [phone] Office: [phone]

## 2019-11-07 NOTE — Interval H&P Note (Signed)
History and Physical Interval Note:  11/07/2019 8:17 AM  Sean Flynn  has presented today for surgery, with the diagnosis of coronary artery disease.  The various methods of treatment have been discussed with the patient and family. After consideration of risks, benefits and other options for treatment, the patient has consented to  Procedure(s): CORONARY ARTERY BYPASS GRAFTING (CABG) x 2 WITH BILATERAL INTERNAL MAMMARY ARTERIES (N/A) as a surgical intervention.  The patient's history has been reviewed, patient examined, no change in status, stable for surgery.  I have reviewed the patient's chart and labs.  Questions were answered to the patient's satisfaction.     Loreli Slot

## 2019-11-07 NOTE — Anesthesia Procedure Notes (Signed)
Central Venous Catheter Insertion Performed by: Gaynelle Adu, MD, anesthesiologist Start/End4/12/2019 8:15 AM, 11/07/2019 8:30 AM Patient location: Pre-op. Preanesthetic checklist: patient identified, IV checked, site marked, risks and benefits discussed, surgical consent, monitors and equipment checked, pre-op evaluation, timeout performed and anesthesia consent Hand hygiene performed  and maximum sterile barriers used  PA cath was placed.Swan type:thermodilution PA Cath depth:50 Procedure performed without using ultrasound guided technique. Attempts: 1 Patient tolerated the procedure well with no immediate complications.

## 2019-11-07 NOTE — Anesthesia Procedure Notes (Signed)
Central Venous Catheter Insertion Performed by: Roderic Palau, MD, anesthesiologist Start/End4/12/2019 8:15 AM, 11/07/2019 8:30 AM Patient location: Pre-op. Preanesthetic checklist: patient identified, IV checked, site marked, risks and benefits discussed, surgical consent, monitors and equipment checked, pre-op evaluation, timeout performed and anesthesia consent Position: Trendelenburg Lidocaine 1% used for infiltration and patient sedated Hand hygiene performed , maximum sterile barriers used  and Seldinger technique used Catheter size: 9 Fr Total catheter length 10. Central line was placed.MAC introducer Procedure performed using ultrasound guided technique. Ultrasound Notes:anatomy identified, needle tip was noted to be adjacent to the nerve/plexus identified, no ultrasound evidence of intravascular and/or intraneural injection and image(s) printed for medical record Attempts: 1 Following insertion, line sutured, dressing applied and Biopatch. Post procedure assessment: blood return through all ports, free fluid flow and no air  Patient tolerated the procedure well with no immediate complications.

## 2019-11-07 NOTE — Anesthesia Procedure Notes (Signed)
Arterial Line Insertion Start/End4/12/2019 8:10 AM, 11/07/2019 8:15 AM Performed by: Alvera Novel, CRNA, CRNA  Patient location: Pre-op. Preanesthetic checklist: patient identified, IV checked, site marked, risks and benefits discussed, surgical consent, monitors and equipment checked, pre-op evaluation, timeout performed and anesthesia consent Lidocaine 1% used for infiltration and patient sedated Left, radial was placed Catheter size: 20 Fr Hand hygiene performed  and maximum sterile barriers used   Attempts: 1 Procedure performed without using ultrasound guided technique. Following insertion, dressing applied and Biopatch. Post procedure assessment: normal and unchanged  Patient tolerated the procedure well with no immediate complications.

## 2019-11-07 NOTE — Progress Notes (Signed)
NIF -28, VC 1L

## 2019-11-07 NOTE — Anesthesia Postprocedure Evaluation (Signed)
Anesthesia Post Note  Patient: Sean Flynn  Procedure(s) Performed: CORONARY ARTERY BYPASS GRAFTING (CABG) x 2 WITH BILATERAL INTERNAL MAMMARY ARTERIES. LIMA TO LAD, RIMA TO DISTAL RCA (N/A Chest) Transesophageal Echocardiogram (Tee) (N/A )     Patient location during evaluation: SICU Anesthesia Type: General Level of consciousness: sedated Pain management: pain level controlled Vital Signs Assessment: post-procedure vital signs reviewed and stable Respiratory status: patient remains intubated per anesthesia plan Cardiovascular status: stable Postop Assessment: no apparent nausea or vomiting Anesthetic complications: no    Last Vitals:  Vitals:   11/07/19 1430 11/07/19 1500  BP:    Pulse: 80 80  Resp: (!) 24 18  Temp: 37 C 37.2 C  SpO2: 99% 98%    Last Pain:  Vitals:   11/07/19 0641  TempSrc: Oral  PainSc:                  Liyanna Cartwright,W. EDMOND

## 2019-11-07 NOTE — Progress Notes (Signed)
Patient ID: Sean Flynn, male   DOB: Dec 09, 1965, 54 y.o.   MRN: [id number] EVENING ROUNDS NOTE :     [address]       [place] [address]             [phone]                 Day of Surgery Procedure(s) (LRB): CORONARY ARTERY BYPASS GRAFTING (CABG) x 2 WITH BILATERAL INTERNAL MAMMARY ARTERIES. LIMA TO LAD, RIMA TO DISTAL RCA (N/A) Transesophageal Echocardiogram (Tee) (N/A)  Total Length of Stay:  LOS: 4 days  BP 92/64   Pulse 80   Temp 99 F (37.2 C)   Resp 16   Ht 5' 9.02" (1.753 m)   Wt 111.6 kg   SpO2 98%   BMI 36.33 kg/m   .Intake/Output      04/04 0701 - 04/05 0700 04/05 0701 - 04/06 0700   I.V. (mL/kg)  1663.2 (14.9)   Blood  560   IV Piggyback  558.6   Total Intake(mL/kg)  2781.7 (24.9)   Urine (mL/kg/hr)  705 (0.6)   Blood  892   Chest Tube  70   Total Output  1667   Net  +1114.7          . sodium chloride    . [START ON 11/08/2019] sodium chloride    . sodium chloride 10 mL/hr at 11/07/19 1423  . albumin human    . cefUROXime (ZINACEF)  IV    . dexmedetomidine (PRECEDEX) IV infusion Stopped (11/07/19 1546)  . famotidine (PEPCID) IV Stopped (11/07/19 1436)  . insulin 1 mL/hr at 11/07/19 1600  . lactated ringers    . lactated ringers 20 mL/hr at 11/07/19 1600  . lactated ringers    . magnesium sulfate 20 mL/hr at 11/07/19 1600  . nitroGLYCERIN Stopped (11/07/19 1434)  . phenylephrine (NEO-SYNEPHRINE) Adult infusion 15 mcg/min (11/07/19 1600)  . potassium chloride    . vancomycin       Lab Results  Component Value Date   WBC 9.9 11/07/2019   HGB 13.2 11/07/2019   HCT 39.1 11/07/2019   PLT 139 (L) 11/07/2019   GLUCOSE 145 (H) 11/07/2019   CHOL 144 11/04/2019   TRIG 317 (H) 11/04/2019   HDL 54 11/04/2019   LDLCALC 27 11/04/2019   ALT 33 11/04/2019   AST 34 11/04/2019   NA 139 11/07/2019   K 4.3 11/07/2019   CL 102 11/07/2019   CREATININE 0.90 11/07/2019   BUN 11 11/07/2019   CO2 25 11/07/2019   INR 1.3 (H)  11/07/2019   HGBA1C 5.5 11/03/2019   Extubated Neuro intact Not bleeding    Delight Ovens MD  [place] [phone] Office [phone] 11/07/2019 4:51 PM

## 2019-11-07 NOTE — Brief Op Note (Signed)
11/03/2019 - 11/07/2019  3:30 PM  PATIENT:  [name]  54 y.o. male  PRE-OPERATIVE DIAGNOSIS:  coronary artery disease  POST-OPERATIVE DIAGNOSIS:  coronary artery disease  PROCEDURE:  Procedure(s):  CORONARY ARTERY BYPASS GRAFTING  - LIMA to LAD -FREE RIMA to Distal RCA  Transesophageal Echocardiogram Rhae Hammock) (N/A)  SURGEON:  Surgeon(s) and Role:    * Loreli Slot, MD - Primary  PHYSICIAN ASSISTANT: Lowella Dandy PA-C  ANESTHESIA:   general  EBL:  892 mL   BLOOD ADMINISTERED: CELLSAVER  DRAINS: Left and Right Pleural Chest Tubes, Mediastinal Chest Drains   LOCAL MEDICATIONS USED:  NONE  SPECIMEN:  No Specimen  DISPOSITION OF SPECIMEN:  N/A  COUNTS:  YES  TOURNIQUET:  * No tourniquets in log *  DICTATION: .Dragon Dictation  PLAN OF CARE: Admit to inpatient   PATIENT DISPOSITION:  ICU - intubated and hemodynamically stable.   Delay start of Pharmacological VTE agent (>24hrs) due to surgical blood loss or risk of bleeding: yes

## 2019-11-07 NOTE — Transfer of Care (Signed)
Immediate Anesthesia Transfer of Care Note  Patient: Sean Flynn  Procedure(s) Performed: CORONARY ARTERY BYPASS GRAFTING (CABG) x 2 WITH BILATERAL INTERNAL MAMMARY ARTERIES. LIMA TO LAD, RIMA TO DISTAL RCA (N/A Chest) Transesophageal Echocardiogram Rhae Hammock) (N/A )  Patient Location: PACU  Anesthesia Type:General  Level of Consciousness: Patient remains intubated per anesthesia plan  Airway & Oxygen Therapy: Patient remains intubated per anesthesia plan and Patient placed on Ventilator (see vital sign flow sheet for setting)  Post-op Assessment: Report given to RN and Post -op Vital signs reviewed and stable  Post vital signs: Reviewed and stable  Last Vitals:  Vitals Value Taken Time  BP 118/58   Temp 36.9 C 11/07/19 1408  Pulse 80 11/07/19 1408  Resp 14 11/07/19 1408  SpO2 98 % 11/07/19 1408  Vitals shown include unvalidated device data.  Last Pain:  Vitals:   11/07/19 0641  TempSrc: Oral  PainSc:       Patients Stated Pain Goal: 0 (11/06/19 2203)  Complications: No apparent anesthesia complications

## 2019-11-07 NOTE — Progress Notes (Signed)
  Echocardiogram Echocardiogram Transesophageal has been performed.  Sean Flynn 11/07/2019, 9:34 AM

## 2019-11-07 NOTE — Op Note (Signed)
NAME: Sean Flynn, ODEM MEDICAL RECORD [id number] ACCOUNT [id number] DATE OF BIRTH:09-17-1965 FACILITY: [facility] LOCATION: [facility] PHYSICIAN:Hardeep Reetz Chaya Jan, MD  OPERATIVE REPORT  DATE OF PROCEDURE:  11/07/2019  PREOPERATIVE DIAGNOSIS:  Severe 2-vessel coronary disease with unstable coronary syndrome.  POSTOPERATIVE DIAGNOSIS:  Severe 2-vessel coronary disease with unstable coronary syndrome.  PROCEDURES:   1.  Median sternotomy.  2.  Extracorporeal circulation 3.  Coronary artery bypass grafting x 2  Left internal mammary artery to left anterior descending,  Free right internal mammary artery to distal right coronary.  SURGEON:  Modesto Charon, MD   ASSISTANT:  Ellwood Handler, PA  ANESTHESIA:  General.  FINDINGS:  Transesophageal echocardiography showed preserved left ventricular wall motion with no significant valvular pathology.  Good quality conduits.  Good quality targets.  CLINICAL NOTE:  Mr. Sean Flynn is a 54 year old man with multiple cardiac risk factors who presented with new onset unstable chest pain.  At catheterization, he had a near totally occluded right coronary and 75% stenosis in the LAD.  He was referred for  coronary artery bypass grafting.  The indications, risks, benefits, and alternatives were discussed in detail with the patient.  He understood and accepted the risks and agreed to proceed.  DESCRIPTION OF PROCEDURE:  Mr. Sean Flynn was brought to the preoperative holding area on 11/07/2019.  Anesthesia placed a Swan-Ganz catheter and an arterial blood pressure monitoring line.  He was taken to the operating room, anesthetized and intubated.  A  Foley catheter was placed.  Intravenous antibiotics were administered.  Transesophageal echocardiography was performed by Dr. Oren Bracket, findings as noted above.  The chest, abdomen and legs were prepped and draped in the usual sterile fashion.  A timeout was performed.  A median sternotomy was performed.   The left internal mammary artery was harvested using standard technique.  Five thousand units of heparin was given prior to dividing the mammary artery distally.  This was a good quality  vessel.  It had excellent flow when divided.  The right internal mammary artery then was harvested using standard technique as well.  It also was a large caliber, good quality vessel with excellent flow when divided distally.  The remainder of the  heparin dose was given.  The pericardium was opened.  The ascending aorta was inspected.  There was no evidence of atherosclerotic disease.  The aorta was cannulated via concentric 2-0 Ethibond pledgeted pursestring sutures.  A dual-stage venous cannula was placed via a  pursestring suture in the right atrial appendage.  Cardiopulmonary bypass was initiated.  Flows were maintained per protocol.  The patient was cooled to 32 degrees Celsius.  The coronary arteries were inspected and anastomotic sites were chosen.  The  conduits were inspected.  The right mammary did not reach the distal right coronary for use as a pedicle graft; therefore, it was divided proximally for use as a free graft.  The proximal stump was suture ligated with a 2-0 silk suture.  A foam pad was placed in the pericardium to insulate the heart.  A temperature probe was placed in the myocardial septum and a cardioplegia cannula was placed in the ascending aorta.  The aorta was crossclamped.  The left ventricle was emptied via the aortic root vent.  Cardiac arrest was achieved with combination of cold antegrade blood cardioplegia and topical iced saline.  One L of cardioplegia was administered.  There was a rapid  diastolic arrest.  There was septal cooling to 9 degrees Celsius.  The distal end of  the free right mammary was bevelled.  It was anastomosed end-to-side to the distal right coronary.  Both of these were 2 mm good quality vessels.  The end-to-side  anastomosis was performed with a running 8-0  Prolene suture.  At the completion of the anastomosis, additional cardioplegia was administered down the aortic root.  There was good backbleeding from the mammary.  The left internal mammary artery was brought through a window in the pericardium.  The distal end was bevelled.  It was anastomosed end-to-side to the distal LAD.  Both the left mammary and LAD were 2 mm good quality vessels.  The end-to-side  anastomosis was performed with a running 8-0 Prolene suture.  At completion of the mammary to the LAD anastomosis, the bulldog clamp was briefly removed from the mammary artery.  Rapid septal rewarming was noted.  The bulldog clamp was replaced.  The  mammary pedicle was tacked to the epicardial surface of the heart with 6-0 Prolene sutures.  Additional cardioplegia was administered via the aortic root.  The right mammary was bevelled at its proximal end and then anastomosed end-to-side to a 4.0 mm punch aortotomy with a running 7-0 Prolene suture.  At completion of the proximal anastomosis,  the patient was placed in Trendelenburg position.  Lidocaine was administered.  The bulldog clamp was again removed from the left mammary artery.  The left ventricle and aorta were de-aired.  The aortic crossclamp was removed.  Total crossclamp time was  43 minutes.  The patient required a single defibrillation with 10 joules and then was in sinus rhythm in the 60s thereafter.  While rewarming was completed, all proximal and distal anastomoses were inspected for hemostasis.  Epicardial pacing wires were placed on the right ventricle and right atrium.  When the patient rewarmed to a core temperature of 37 degrees Celsius, he was  weaned from cardiopulmonary bypass on the first attempt.  Total bypass time was 77 minutes.  He was in sinus rhythm with no inotropic support at the time of separation from bypass.  Post-bypass transesophageal echocardiography was unchanged from the pre-bypass study.  A test dose of  protamine was administered.  The atrial and aortic cannulae were removed.  The remainder of the protamine was administered without incident.  The chest  was irrigated with warm saline.  Hemostasis was achieved.  Blake drains were placed in the pleural space bilaterally.  A mediastinal drain was placed as well.  The pericardium was reapproximated with interrupted 3-0 silk sutures.  It came together easily  without tension or kinking the underlying grafts.  The sternum was closed with a combination of single and double heavy gauge stainless steel wires.  The pectoralis fascia, subcutaneous tissue and skin were closed in standard fashion.  All sponge,  needle and instrument counts were correct at the end of the procedure.  The patient was taken from the operating room to the surgical intensive care unit, intubated and in good condition.  VN/NUANCE  D:11/07/2019 T:11/07/2019 JOB:[id number]

## 2019-11-07 NOTE — Progress Notes (Signed)
ANTICOAGULATION CONSULT NOTE - Follow Up Consult  Pharmacy Consult for heparin Indication: CAD awaiting CABG  Labs: Recent Labs    11/04/19 0600 11/04/19 1812 11/04/19 1812 11/05/19 0403 11/05/19 0403 11/05/19 1241 11/06/19 0418 11/06/19 2034 11/07/19 0358  HGB  --  15.5   < >  --   --   --  14.8  --  14.5  HCT  --  44.6  --   --   --   --  42.5  --  41.8  PLT  --  208  --   --   --   --  181  --  193  APTT  --   --   --   --   --   --   --  45*  --   LABPROT  --   --   --   --   --   --   --  12.3  --   INR  --   --   --   --   --   --   --  0.9  --   HEPARINUNFRC  --   --   --  0.26*   < > 0.43 0.35  --  0.18*  CREATININE  --  1.12  --  1.09  --   --  1.04  --   --   TROPONINIHS 924*  --   --   --   --   --   --   --   --    < > = values in this interval not displayed.    Assessment: 53yo male subtherapeutic on heparin after two levels at goal though had been trending down; no gtt issues or signs of bleeding per RN; plan for CABG this afternoon.  Goal of Therapy:  Heparin level 0.3-0.7 units/ml   Plan:  Will increase heparin gtt by 2 units/kg/hr to 1800 units/hr until off for OR.    Vernard Gambles, PharmD, BCPS  11/07/2019,5:14 AM

## 2019-11-08 ENCOUNTER — Encounter: Payer: Self-pay | Admitting: *Deleted

## 2019-11-08 ENCOUNTER — Inpatient Hospital Stay (HOSPITAL_COMMUNITY): Payer: BC Managed Care – PPO

## 2019-11-08 ENCOUNTER — Other Ambulatory Visit: Payer: Self-pay

## 2019-11-08 LAB — BASIC METABOLIC PANEL
Anion gap: 13 (ref 5–15)
Anion gap: 11 (ref 5–15)
BUN: 14 mg/dL (ref 6–20)
BUN: 18 mg/dL (ref 6–20)
CO2: 20 mmol/L — ABNORMAL LOW (ref 22–32)
CO2: 23 mmol/L (ref 22–32)
Calcium: 8.1 mg/dL — ABNORMAL LOW (ref 8.9–10.3)
Calcium: 8.2 mg/dL — ABNORMAL LOW (ref 8.9–10.3)
Chloride: 105 mmol/L (ref 98–111)
Chloride: 102 mmol/L (ref 98–111)
Creatinine, Ser: 1.06 mg/dL (ref 0.61–1.24)
Creatinine, Ser: 1.26 mg/dL — ABNORMAL HIGH (ref 0.61–1.24)
GFR calc Af Amer: >60 mL/min (ref >60)
GFR calc Af Amer: >60 mL/min (ref >60)
GFR calc non Af Amer: >60 mL/min (ref >60)
GFR calc non Af Amer: >60 mL/min (ref >60)
Glucose, Bld: 154 mg/dL — ABNORMAL HIGH (ref 70–99)
Glucose, Bld: 162 mg/dL — ABNORMAL HIGH (ref 70–99)
Potassium: 3.9 mmol/L (ref 3.5–5.1)
Potassium: 3.8 mmol/L (ref 3.5–5.1)
Sodium: 138 mmol/L (ref 135–145)
Sodium: 136 mmol/L (ref 135–145)

## 2019-11-08 LAB — POCT I-STAT 7, (LYTES, BLD GAS, ICA,H+H)
Acid-base deficit: 3 mmol/L — ABNORMAL HIGH (ref 0.0–2.0)
Acid-base deficit: 2 mmol/L (ref 0.0–2.0)
Bicarbonate: 24.2 mmol/L (ref 20.0–28.0)
Bicarbonate: 22.7 mmol/L (ref 20.0–28.0)
Calcium, Ion: 1.14 mmol/L — ABNORMAL LOW (ref 1.15–1.40)
Calcium, Ion: 1.14 mmol/L — ABNORMAL LOW (ref 1.15–1.40)
HCT: 36 % — ABNORMAL LOW (ref 39.0–52.0)
HCT: 37 % — ABNORMAL LOW (ref 39.0–52.0)
Hemoglobin: 12.2 g/dL — ABNORMAL LOW (ref 13.0–17.0)
Hemoglobin: 12.6 g/dL — ABNORMAL LOW (ref 13.0–17.0)
O2 Saturation: 95 %
O2 Saturation: 97 %
Patient temperature: 36.7
Potassium: 4.6 mmol/L (ref 3.5–5.1)
Potassium: 4.5 mmol/L (ref 3.5–5.1)
Sodium: 139 mmol/L (ref 135–145)
Sodium: 139 mmol/L (ref 135–145)
TCO2: 26 mmol/L (ref 22–32)
TCO2: 24 mmol/L (ref 22–32)
pCO2 arterial: 49.3 mmHg — ABNORMAL HIGH (ref 32.0–48.0)
pCO2 arterial: 37.6 mmHg (ref 32.0–48.0)
pH, Arterial: 7.3 — ABNORMAL LOW (ref 7.350–7.450)
pH, Arterial: 7.388 (ref 7.350–7.450)
pO2, Arterial: 83 mmHg (ref 83.0–108.0)
pO2, Arterial: 90 mmHg (ref 83.0–108.0)

## 2019-11-08 LAB — CBC
HCT: 38.2 % — ABNORMAL LOW (ref 39.0–52.0)
HCT: 37.2 % — ABNORMAL LOW (ref 39.0–52.0)
Hemoglobin: 12.9 g/dL — ABNORMAL LOW (ref 13.0–17.0)
Hemoglobin: 12.7 g/dL — ABNORMAL LOW (ref 13.0–17.0)
MCH: 31.9 pg (ref 26.0–34.0)
MCH: 32.6 pg (ref 26.0–34.0)
MCHC: 33.8 g/dL (ref 30.0–36.0)
MCHC: 34.1 g/dL (ref 30.0–36.0)
MCV: 94.6 fL (ref 80.0–100.0)
MCV: 95.4 fL (ref 80.0–100.0)
Platelets: 186 10*3/uL (ref 150–400)
Platelets: 199 10*3/uL (ref 150–400)
RBC: 4.04 MIL/uL — ABNORMAL LOW (ref 4.22–5.81)
RBC: 3.9 MIL/uL — ABNORMAL LOW (ref 4.22–5.81)
RDW: 11.9 % (ref 11.5–15.5)
RDW: 11.9 % (ref 11.5–15.5)
WBC: 10.1 10*3/uL (ref 4.0–10.5)
WBC: 10.8 10*3/uL — ABNORMAL HIGH (ref 4.0–10.5)
nRBC: 0 % (ref 0.0–0.2)
nRBC: 0 % (ref 0.0–0.2)

## 2019-11-08 LAB — MAGNESIUM
Magnesium: 2.4 mg/dL (ref 1.7–2.4)
Magnesium: 2.6 mg/dL — ABNORMAL HIGH (ref 1.7–2.4)

## 2019-11-08 LAB — GLUCOSE, CAPILLARY
Glucose-Capillary: 118 mg/dL — ABNORMAL HIGH (ref 70–99)
Glucose-Capillary: 125 mg/dL — ABNORMAL HIGH (ref 70–99)
Glucose-Capillary: 132 mg/dL — ABNORMAL HIGH (ref 70–99)
Glucose-Capillary: 136 mg/dL — ABNORMAL HIGH (ref 70–99)
Glucose-Capillary: 139 mg/dL — ABNORMAL HIGH (ref 70–99)
Glucose-Capillary: 140 mg/dL — ABNORMAL HIGH (ref 70–99)
Glucose-Capillary: 142 mg/dL — ABNORMAL HIGH (ref 70–99)
Glucose-Capillary: 157 mg/dL — ABNORMAL HIGH (ref 70–99)
Glucose-Capillary: 160 mg/dL — ABNORMAL HIGH (ref 70–99)
Glucose-Capillary: 161 mg/dL — ABNORMAL HIGH (ref 70–99)
Glucose-Capillary: 169 mg/dL — ABNORMAL HIGH (ref 70–99)

## 2019-11-08 MED ORDER — ENOXAPARIN SODIUM 40 MG/0.4ML ~~LOC~~ SOLN
40.0000 mg | Freq: Every day | SUBCUTANEOUS | Status: DC
  Administered 2019-11-08: 40 mg via SUBCUTANEOUS
  Filled 2019-11-08: qty 0.4

## 2019-11-08 MED ORDER — KETOROLAC TROMETHAMINE 15 MG/ML IJ SOLN
15.0000 mg | Freq: Four times a day (QID) | INTRAMUSCULAR | Status: AC
  Administered 2019-11-08 – 2019-11-10 (×7): 15 mg via INTRAVENOUS
  Filled 2019-11-08 (×7): qty 1

## 2019-11-08 MED ORDER — INSULIN DETEMIR 100 UNIT/ML ~~LOC~~ SOLN
20.0000 [IU] | Freq: Every day | SUBCUTANEOUS | Status: DC
  Administered 2019-11-08: 20 [IU] via SUBCUTANEOUS
  Filled 2019-11-08 (×2): qty 0.2

## 2019-11-08 MED ORDER — INSULIN ASPART 100 UNIT/ML ~~LOC~~ SOLN
0.0000 [IU] | SUBCUTANEOUS | Status: DC
  Administered 2019-11-08 (×3): 2 [IU] via SUBCUTANEOUS

## 2019-11-08 NOTE — Progress Notes (Signed)
[facility] BRIEF SICU PROGRESS NOTE  1 Day Post-Op  S/P Procedure(s) (LRB): CORONARY ARTERY BYPASS GRAFTING (CABG) x 2 WITH BILATERAL INTERNAL MAMMARY ARTERIES. LIMA TO LAD, RIMA TO DISTAL RCA (N/A) Transesophageal Echocardiogram (Tee) (N/A)   Stable day Sinus tach w/ stable BP Breathing comfortably w/ O2 sats 93-95% UOP adequate Labs okay, creatinine up slightly 1.26  Plan: Continue current plan  Purcell Nails, MD 11/08/2019 6:38 PM

## 2019-11-08 NOTE — Progress Notes (Signed)
1 Day Post-Op Procedure(s) (LRB): CORONARY ARTERY BYPASS GRAFTING (CABG) x 2 WITH BILATERAL INTERNAL MAMMARY ARTERIES. LIMA TO LAD, RIMA TO DISTAL RCA (N/A) Transesophageal Echocardiogram (Tee) (N/A) Subjective: Some incisional pain and some right knee pain- less than 2 days ago  Objective: Vital signs in last 24 hours: Temp:  [98.2 F (36.8 C)-101.7 F (38.7 C)] 100.9 F (38.3 C) (04/06 0700) Pulse Rate:  [40-112] 105 (04/06 0700) Cardiac Rhythm: Normal sinus rhythm (04/06 0400) Resp:  [0-39] 27 (04/06 0700) BP: (92-182)/(59-113) 139/76 (04/06 0700) SpO2:  [88 %-100 %] 93 % (04/06 0700) Arterial Line BP: (96-155)/(48-121) 126/71 (04/06 0700) FiO2 (%):  [40 %-50 %] 40 % (04/05 1610) Weight:  [113.4 kg] 113.4 kg (04/06 0500)  Hemodynamic parameters for last 24 hours: PAP: (18-59)/(4-21) 52/14 CO:  [4.4 L/min-5.3 L/min] 5.1 L/min CI:  [2 L/min/m2-2.3 L/min/m2] 2.3 L/min/m2  Intake/Output from previous day: 04/05 0701 - 04/06 0700 In: 4155.8 [P.O.:480; I.V.:2149.7; Blood:560; IV Piggyback:966.1] Out: 2662 [Urine:1400; Blood:892; Chest Tube:370] Intake/Output this shift: No intake/output data recorded.  General appearance: alert, cooperative and no distress Neurologic: intact Heart: regular rate and rhythm Lungs: diminished breath sounds bibasilar Abdomen: normal findings: soft, non-tender Extremities: R knee less erythema, swelling down  Lab Results: Recent Labs    11/07/19 2023 11/08/19 0427  WBC 8.7 10.1  HGB 13.3 12.9*  HCT 38.4* 38.2*  PLT 174 186   BMET:  Recent Labs    11/07/19 2023 11/08/19 0427  NA 138 138  K 4.2 3.9  CL 109 105  CO2 20* 20*  GLUCOSE 134* 154*  BUN 13 14  CREATININE 1.06 1.06  CALCIUM 7.7* 8.1*    PT/INR:  Recent Labs    11/07/19 1411  LABPROT 15.7*  INR 1.3*   ABG    Component Value Date/Time   PHART 7.388 11/07/2019 1614   HCO3 22.7 11/07/2019 1614   TCO2 24 11/07/2019 1614   ACIDBASEDEF 2.0 11/07/2019 1614   O2SAT  97.0 11/07/2019 1614   CBG (last 3)  Recent Labs    11/08/19 0425 11/08/19 0519 11/08/19 0720  GLUCAP 142* 160* 169*    Assessment/Plan: S/P Procedure(s) (LRB): CORONARY ARTERY BYPASS GRAFTING (CABG) x 2 WITH BILATERAL INTERNAL MAMMARY ARTERIES. LIMA TO LAD, RIMA TO DISTAL RCA (N/A) Transesophageal Echocardiogram (Tee) (N/A) POD # 1 Doing well CV- stable in SR, good cardiac output  DC swan and A line  ASA, statin, beta blocker RESP_ IS for atelectasis RENAL- creatinine and lytes OK ENDO- CBG modestly elevated, transition to levemir + SSI GI- advance diet Gout- continue colchicine, will give toradol x 48 hours as well DC chest tubes Mobilize   LOS: 5 days    Loreli Slot 11/08/2019

## 2019-11-09 ENCOUNTER — Inpatient Hospital Stay (HOSPITAL_COMMUNITY): Payer: BC Managed Care – PPO

## 2019-11-09 LAB — CBC
HCT: 33 % — ABNORMAL LOW (ref 39.0–52.0)
Hemoglobin: 10.9 g/dL — ABNORMAL LOW (ref 13.0–17.0)
MCH: 31.9 pg (ref 26.0–34.0)
MCHC: 33 g/dL (ref 30.0–36.0)
MCV: 96.5 fL (ref 80.0–100.0)
Platelets: 179 10*3/uL (ref 150–400)
RBC: 3.42 MIL/uL — ABNORMAL LOW (ref 4.22–5.81)
RDW: 11.9 % (ref 11.5–15.5)
WBC: 9.1 10*3/uL (ref 4.0–10.5)
nRBC: 0 % (ref 0.0–0.2)

## 2019-11-09 LAB — BASIC METABOLIC PANEL
Anion gap: 11 (ref 5–15)
BUN: 19 mg/dL (ref 6–20)
CO2: 25 mmol/L (ref 22–32)
Calcium: 8 mg/dL — ABNORMAL LOW (ref 8.9–10.3)
Chloride: 102 mmol/L (ref 98–111)
Creatinine, Ser: 1.05 mg/dL (ref 0.61–1.24)
GFR calc Af Amer: >60 mL/min (ref >60)
GFR calc non Af Amer: >60 mL/min (ref >60)
Glucose, Bld: 125 mg/dL — ABNORMAL HIGH (ref 70–99)
Potassium: 3.6 mmol/L (ref 3.5–5.1)
Sodium: 138 mmol/L (ref 135–145)

## 2019-11-09 LAB — GLUCOSE, CAPILLARY
Glucose-Capillary: 118 mg/dL — ABNORMAL HIGH (ref 70–99)
Glucose-Capillary: 133 mg/dL — ABNORMAL HIGH (ref 70–99)

## 2019-11-09 MED ORDER — SODIUM CHLORIDE 0.9% FLUSH
3.0000 mL | Freq: Two times a day (BID) | INTRAVENOUS | Status: DC
  Administered 2019-11-09 – 2019-11-10 (×3): 3 mL via INTRAVENOUS

## 2019-11-09 MED ORDER — ONDANSETRON HCL 4 MG PO TABS: 4.0000 mg | ORAL_TABLET | Freq: Four times a day (QID) | ORAL | Status: DC | PRN

## 2019-11-09 MED ORDER — PANTOPRAZOLE SODIUM 40 MG PO TBEC
40.0000 mg | DELAYED_RELEASE_TABLET | Freq: Every day | ORAL | Status: DC
  Administered 2019-11-09 – 2019-11-11 (×3): 40 mg via ORAL
  Filled 2019-11-09 (×3): qty 1

## 2019-11-09 MED ORDER — SODIUM CHLORIDE 0.9% FLUSH: 3.0000 mL | INTRAVENOUS | Status: DC | PRN

## 2019-11-09 MED ORDER — ATORVASTATIN CALCIUM 40 MG PO TABS
40.0000 mg | ORAL_TABLET | Freq: Every day | ORAL | Status: DC
  Administered 2019-11-09 – 2019-11-10 (×2): 40 mg via ORAL
  Filled 2019-11-09 (×2): qty 1

## 2019-11-09 MED ORDER — POTASSIUM CHLORIDE 10 MEQ/50ML IV SOLN
10.0000 meq | INTRAVENOUS | Status: DC
  Administered 2019-11-09 (×2): 10 meq via INTRAVENOUS
  Filled 2019-11-09 (×3): qty 50

## 2019-11-09 MED ORDER — MAGNESIUM HYDROXIDE 400 MG/5ML PO SUSP: 30.0000 mL | Freq: Every day | ORAL | Status: DC | PRN

## 2019-11-09 MED ORDER — METOPROLOL TARTRATE 25 MG PO TABS
25.0000 mg | ORAL_TABLET | Freq: Two times a day (BID) | ORAL | Status: DC
  Administered 2019-11-09 – 2019-11-10 (×3): 25 mg via ORAL
  Filled 2019-11-09 (×3): qty 1

## 2019-11-09 MED ORDER — SODIUM CHLORIDE 0.9 % IV SOLN: 250.0000 mL | INTRAVENOUS | Status: DC | PRN

## 2019-11-09 MED ORDER — ALUM & MAG HYDROXIDE-SIMETH 200-200-20 MG/5ML PO SUSP: 15.0000 mL | ORAL | Status: DC | PRN

## 2019-11-09 MED ORDER — LISINOPRIL 5 MG PO TABS
5.0000 mg | ORAL_TABLET | Freq: Every day | ORAL | Status: DC
  Administered 2019-11-09 – 2019-11-10 (×2): 5 mg via ORAL
  Filled 2019-11-09 (×2): qty 1

## 2019-11-09 MED ORDER — DOCUSATE SODIUM 100 MG PO CAPS
200.0000 mg | ORAL_CAPSULE | Freq: Every day | ORAL | Status: DC
  Filled 2019-11-09 (×2): qty 2

## 2019-11-09 MED ORDER — ~~LOC~~ CARDIAC SURGERY, PATIENT & FAMILY EDUCATION: Freq: Once | Status: AC

## 2019-11-09 MED ORDER — TRAMADOL HCL 50 MG PO TABS
50.0000 mg | ORAL_TABLET | ORAL | Status: DC | PRN
  Administered 2019-11-09 – 2019-11-10 (×3): 100 mg via ORAL
  Filled 2019-11-09 (×3): qty 2

## 2019-11-09 MED ORDER — BISACODYL 10 MG RE SUPP: 10.0000 mg | Freq: Every day | RECTAL | Status: DC | PRN

## 2019-11-09 MED ORDER — BISACODYL 5 MG PO TBEC: 10.0000 mg | DELAYED_RELEASE_TABLET | Freq: Every day | ORAL | Status: DC | PRN

## 2019-11-09 MED ORDER — CHLORHEXIDINE GLUCONATE CLOTH 2 % EX PADS
6.0000 | MEDICATED_PAD | Freq: Every day | CUTANEOUS | Status: DC
  Administered 2019-11-09: 6 via TOPICAL

## 2019-11-09 MED ORDER — ONDANSETRON HCL 4 MG/2ML IJ SOLN: 4.0000 mg | Freq: Four times a day (QID) | INTRAMUSCULAR | Status: DC | PRN

## 2019-11-09 NOTE — Progress Notes (Signed)
RED MEWS score due to Temp 100.9 F orally at 19.05, sinus tachycardia on monitor, HR at rest 100-110, when ambulated HR 127-130, RR 24-27, SPO2 92-94% on room air, auscultated clear upper and basal diminished with rhonchi bilaterally. MD made aware per RN day shift reported. CxR appeared to have left basilar atelectasis. Encouraged breathing exercise with incentive spirometer, he needed reinforcement, able to do 750/2500ml, he had strong but not productive coughing.  Pt appeared alert and oriented x 4, no immediate medical needed or any distress seen at this time.We continue to monitor vital signs closely per protocol.  Filiberto Pinks, RN

## 2019-11-09 NOTE — Progress Notes (Addendum)
[address]       [place] [address]             [phone]      2 Days Post-Op Procedure(s) (LRB): CORONARY ARTERY BYPASS GRAFTING (CABG) x 2 WITH BILATERAL INTERNAL MAMMARY ARTERIES. LIMA TO LAD, RIMA TO DISTAL RCA (N/A) Transesophageal Echocardiogram (Tee) (N/A) Subjective: Feeling better  Objective: Vital signs in last 24 hours: Temp:  [98 F (36.7 C)-100.5 F (38.1 C)] 100.5 F (38.1 C) (04/07 0400) Pulse Rate:  [97-132] 114 (04/07 0700) Cardiac Rhythm: Sinus tachycardia (04/07 0425) Resp:  [20-30] 20 (04/06 2000) BP: (100-144)/(62-91) 128/67 (04/07 0700) SpO2:  [91 %-95 %] 92 % (04/07 0700) Weight:  [113.5 kg] 113.5 kg (04/07 0500)  Hemodynamic parameters for last 24 hours:    Intake/Output from previous day: 04/06 0701 - 04/07 0700 In: 887.1 [P.O.:480; I.V.:187.8; IV Piggyback:219.3] Out: 740 [Urine:740] Intake/Output this shift: No intake/output data recorded.  General appearance: alert, cooperative and no distress Heart: regular rate and rhythm Lungs: clear to auscultation bilaterally Abdomen: benign Extremities: trace edema Wound: dressing CDI  Lab Results: Recent Labs    11/08/19 1717 11/09/19 0352  WBC 10.8* 9.1  HGB 12.7* 10.9*  HCT 37.2* 33.0*  PLT 199 179   BMET:  Recent Labs    11/08/19 1717 11/09/19 0352  NA 136 138  K 3.8 3.6  CL 102 102  CO2 23 25  GLUCOSE 162* 125*  BUN 18 19  CREATININE 1.26* 1.05  CALCIUM 8.2* 8.0*    PT/INR:  Recent Labs    11/07/19 1411  LABPROT 15.7*  INR 1.3*   ABG    Component Value Date/Time   PHART 7.388 11/07/2019 1614   HCO3 22.7 11/07/2019 1614   TCO2 24 11/07/2019 1614   ACIDBASEDEF 2.0 11/07/2019 1614   O2SAT 97.0 11/07/2019 1614   CBG (last 3)  Recent Labs    11/08/19 2100 11/08/19 2345 11/09/19 0351  GLUCAP 139* 118* 118*    Meds Scheduled Meds: . acetaminophen  1,000 mg Oral Q6H   Or  . acetaminophen (TYLENOL) oral liquid 160 mg/5 mL  1,000 mg  Per Tube Q6H  . aspirin EC  325 mg Oral Daily   Or  . aspirin  324 mg Per Tube Daily  . bisacodyl  10 mg Oral Daily   Or  . bisacodyl  10 mg Rectal Daily  . Chlorhexidine Gluconate Cloth  6 each Topical Daily  . colchicine  0.6 mg Oral BID  . docusate sodium  200 mg Oral Daily  . enoxaparin (LOVENOX) injection  40 mg Subcutaneous QHS  . folic acid  1 mg Oral Daily  . insulin aspart  0-24 Units Subcutaneous Q4H  . insulin detemir  20 Units Subcutaneous Daily  . ketorolac  15 mg Intravenous Q6H  . metoprolol tartrate  12.5 mg Oral BID   Or  . metoprolol tartrate  12.5 mg Per Tube BID  . pantoprazole  40 mg Oral Daily  . sodium chloride flush  3 mL Intravenous Q12H  . thiamine  100 mg Oral Daily   Or  . thiamine  100 mg Intravenous Daily   Continuous Infusions: . sodium chloride    . sodium chloride Stopped (11/08/19 1003)  . sodium chloride 10 mL/hr at 11/07/19 1423  . cefUROXime (ZINACEF)  IV Stopped (11/08/19 2141)  . dexmedetomidine (PRECEDEX) IV infusion Stopped (11/07/19 1546)  . insulin Stopped (11/08/19 1104)  . lactated ringers    .  lactated ringers Stopped (11/09/19 7169)  . lactated ringers    . nitroGLYCERIN Stopped (11/07/19 1434)  . phenylephrine (NEO-SYNEPHRINE) Adult infusion Stopped (11/07/19 2112)  . potassium chloride 10 mEq (11/09/19 0748)   PRN Meds:.sodium chloride, dextrose, lactated ringers, metoprolol tartrate, midazolam, morphine injection, ondansetron (ZOFRAN) IV, oxyCODONE, sodium chloride flush, sodium chloride flush, traMADol, zolpidem  Xrays DG Chest Port 1 View  Result Date: 11/08/2019 CLINICAL DATA:  Status post coronary artery bypass grafting EXAM: PORTABLE CHEST 1 VIEW COMPARISON:  November 07, 2019 FINDINGS: Endotracheal tube has been removed. Swan-Ganz catheter tip is in the right main pulmonary artery. There is a left chest tube and mediastinal drain. Temporary pacemaker wires are attached to the right heart. No pneumothorax. There is  atelectatic change in the lower lung regions. No edema or airspace consolidation. Heart is upper normal in size, stable. Pulmonary vascular is normal. There is aortic atherosclerosis. No bone lesions. IMPRESSION: Tube and catheter positions as described without evident pneumothorax. Lower lung region atelectasis. Lungs elsewhere clear. Stable cardiac silhouette. Aortic Atherosclerosis (ICD10-I70.0). Electronically Signed   By: Bretta Bang III M.D.   On: 11/08/2019 08:40   DG Chest Port 1 View  Result Date: 11/07/2019 CLINICAL DATA:  Status post coronary bypass graft. EXAM: PORTABLE CHEST 1 VIEW COMPARISON:  November 06, 2019. FINDINGS: Stable cardiomediastinal silhouette. Atherosclerosis of thoracic aorta is noted. Endotracheal tube is in good position. Right internal jugular Swan-Ganz catheter is noted with distal tip in expected position of main pulmonary artery. Bilateral chest tubes are noted without pneumothorax. Mild bibasilar subsegmental atelectasis is noted. Bony thorax is unremarkable. IMPRESSION: Endotracheal tube in good position. Bilateral chest tubes are noted without pneumothorax. Mild bibasilar subsegmental atelectasis. Electronically Signed   By: Lupita Raider M.D.   On: 11/07/2019 14:15    Assessment/Plan: S/P Procedure(s) (LRB): CORONARY ARTERY BYPASS GRAFTING (CABG) x 2 WITH BILATERAL INTERNAL MAMMARY ARTERIES. LIMA TO LAD, RIMA TO DISTAL RCA (N/A) Transesophageal Echocardiogram (Tee) (N/A)  Doing well POD #2 1 hemodyn stable except tachy - will increase beta locker 2 tmax 100.5, no leukocytosis- observe clinically, remove foley 3 sats good on RA 4 renal fxn in normal rande, good UOP, does not appear volume overloaded 5 H/H - stable ABL loss 6 gout sx improved 7 BS controlled- No DM hx 8 routine pulm toilet and rehab 9 tx to 4 e  LOS: 6 days    Rowe Clack PA-C Pager [phone] 11/09/2019   Agree with above Doing well Floor today  Corliss Skains

## 2019-11-09 NOTE — Progress Notes (Signed)
Patient arrived to 4E room 13 at this time. V/s and assessment complete. CHG bath done. Patient oriented to room and how to call nurse with any needs. Call bell with in reach.

## 2019-11-09 NOTE — Progress Notes (Addendum)
Came to ambulate before lunch and pt feeling exhausted in recliner. Asked if he could rest and walk after lunch. Returned after lunch and pt just to bed, still tired, SOB. Also c/o severe knee pain right now. RN just gave pain meds. Encouraged him to rest a couple hours and then ambulate with staff. I can be called if he is ready before then. Encouraged IS as well.  [phone] Ethelda Chick CES, ACSM 1:34 PM 11/09/2019

## 2019-11-09 NOTE — Progress Notes (Signed)
Patient ambulated in the hallway with a front wheel walker about 950 ft with nurse at this time. Patient tolerated well. Patient did get a bit SOB and tachy HR130's . Back to room and in bed. O2 94% BP 129/79 HR back down to 115. Will continue to monitor.

## 2019-11-09 NOTE — Plan of Care (Signed)
  Problem: Education: Goal: Knowledge of General Education information will improve Description: Including pain rating scale, medication(s)/side effects and non-pharmacologic comfort measures Outcome: Progressing   Problem: Health Behavior/Discharge Planning: Goal: Ability to manage health-related needs will improve Outcome: Progressing   Problem: Clinical Measurements: Goal: Ability to maintain clinical measurements within normal limits will improve Outcome: Progressing Goal: Will remain free from infection Outcome: Progressing Goal: Diagnostic test results will improve Outcome: Progressing Goal: Cardiovascular complication will be avoided Outcome: Progressing   Problem: Activity: Goal: Risk for activity intolerance will decrease Outcome: Progressing   Problem: Nutrition: Goal: Adequate nutrition will be maintained Outcome: Progressing   Problem: Coping: Goal: Level of anxiety will decrease Outcome: Progressing   Problem: Elimination: Goal: Will not experience complications related to bowel motility Outcome: Progressing Goal: Will not experience complications related to urinary retention Outcome: Progressing   Problem: Pain Managment: Goal: General experience of comfort will improve Outcome: Progressing   Problem: Safety: Goal: Ability to remain free from injury will improve Outcome: Progressing   Problem: Skin Integrity: Goal: Risk for impaired skin integrity will decrease Outcome: Progressing   Problem: Education: Goal: Understanding of CV disease, CV risk reduction, and recovery process will improve Outcome: Progressing Goal: Individualized Educational Video(s) Outcome: Progressing   Problem: Activity: Goal: Ability to return to baseline activity level will improve Outcome: Progressing   Problem: Cardiovascular: Goal: Ability to achieve and maintain adequate cardiovascular perfusion will improve Outcome: Progressing Goal: Vascular access site(s) Level  0-1 will be maintained Outcome: Progressing   Problem: Health Behavior/Discharge Planning: Goal: Ability to safely manage health-related needs after discharge will improve Outcome: Progressing   Problem: Education: Goal: Will demonstrate proper wound care and an understanding of methods to prevent future damage Outcome: Progressing Goal: Knowledge of disease or condition will improve Outcome: Progressing Goal: Knowledge of the prescribed therapeutic regimen will improve Outcome: Progressing Goal: Individualized Educational Video(s) Outcome: Progressing   Problem: Activity: Goal: Risk for activity intolerance will decrease Outcome: Progressing   Problem: Cardiac: Goal: Will achieve and/or maintain hemodynamic stability Outcome: Progressing   Problem: Clinical Measurements: Goal: Postoperative complications will be avoided or minimized Outcome: Progressing   Problem: Respiratory: Goal: Respiratory status will improve Outcome: Progressing   Problem: Skin Integrity: Goal: Wound healing without signs and symptoms of infection Outcome: Progressing Goal: Risk for impaired skin integrity will decrease Outcome: Progressing   Problem: Urinary Elimination: Goal: Ability to achieve and maintain adequate renal perfusion and functioning will improve Outcome: Progressing

## 2019-11-09 NOTE — Progress Notes (Signed)
SPO2 dropped to 87-89% on room air at night. O2 NCL 2 LPM given. SPO2 increased to 94-95%. Temp 99.2-98.4, HR 107, continue to be sinus tachycardia. BP within normal limits. Will continue to monitor.   Filiberto Pinks, RN

## 2019-11-09 NOTE — Progress Notes (Signed)
Mid sternal dressing removed per MD order. Betadine applied. Mid sternal incision oozing minimal serous sanguinous drainage. Incision approximated and intact. Gauze applied.

## 2019-11-10 LAB — CULTURE, BODY FLUID W GRAM STAIN -BOTTLE: Culture: NO GROWTH

## 2019-11-10 LAB — BASIC METABOLIC PANEL
Anion gap: 10 (ref 5–15)
BUN: 17 mg/dL (ref 6–20)
CO2: 26 mmol/L (ref 22–32)
Calcium: 8 mg/dL — ABNORMAL LOW (ref 8.9–10.3)
Chloride: 100 mmol/L (ref 98–111)
Creatinine, Ser: 1.09 mg/dL (ref 0.61–1.24)
GFR calc Af Amer: >60 mL/min (ref >60)
GFR calc non Af Amer: >60 mL/min (ref >60)
Glucose, Bld: 122 mg/dL — ABNORMAL HIGH (ref 70–99)
Potassium: 3.9 mmol/L (ref 3.5–5.1)
Sodium: 136 mmol/L (ref 135–145)

## 2019-11-10 LAB — CBC
HCT: 29.8 % — ABNORMAL LOW (ref 39.0–52.0)
Hemoglobin: 10 g/dL — ABNORMAL LOW (ref 13.0–17.0)
MCH: 32.1 pg (ref 26.0–34.0)
MCHC: 33.6 g/dL (ref 30.0–36.0)
MCV: 95.5 fL (ref 80.0–100.0)
Platelets: 199 10*3/uL (ref 150–400)
RBC: 3.12 MIL/uL — ABNORMAL LOW (ref 4.22–5.81)
RDW: 12.1 % (ref 11.5–15.5)
WBC: 8.9 10*3/uL (ref 4.0–10.5)
nRBC: 0 % (ref 0.0–0.2)

## 2019-11-10 MED ORDER — METOPROLOL TARTRATE 25 MG PO TABS
25.0000 mg | ORAL_TABLET | Freq: Once | ORAL | Status: AC
  Administered 2019-11-10: 25 mg via ORAL

## 2019-11-10 MED ORDER — ASPIRIN 325 MG PO TABS
325.0000 mg | ORAL_TABLET | Freq: Every day | ORAL | Status: DC
  Administered 2019-11-10 – 2019-11-11 (×2): 325 mg via ORAL
  Filled 2019-11-10 (×2): qty 1

## 2019-11-10 MED ORDER — METOPROLOL TARTRATE 50 MG PO TABS
50.0000 mg | ORAL_TABLET | Freq: Two times a day (BID) | ORAL | Status: DC
  Administered 2019-11-10 – 2019-11-11 (×2): 50 mg via ORAL
  Filled 2019-11-10 (×2): qty 1

## 2019-11-10 MED ORDER — LISINOPRIL 10 MG PO TABS
10.0000 mg | ORAL_TABLET | Freq: Every day | ORAL | Status: DC
  Administered 2019-11-11: 10 mg via ORAL
  Filled 2019-11-10: qty 1

## 2019-11-10 MED ORDER — LEVALBUTEROL HCL 0.63 MG/3ML IN NEBU: 0.6300 mg | INHALATION_SOLUTION | Freq: Three times a day (TID) | RESPIRATORY_TRACT | Status: DC | PRN

## 2019-11-10 MED ORDER — LISINOPRIL 5 MG PO TABS
5.0000 mg | ORAL_TABLET | Freq: Once | ORAL | Status: AC
  Administered 2019-11-10: 5 mg via ORAL

## 2019-11-10 MED FILL — Lidocaine HCl Local Soln Prefilled Syringe 100 MG/5ML (2%): INTRAMUSCULAR | Qty: 5 | Status: AC

## 2019-11-10 MED FILL — Sodium Chloride IV Soln 0.9%: INTRAVENOUS | Qty: 2000 | Status: AC

## 2019-11-10 MED FILL — Heparin Sodium (Porcine) Inj 1000 Unit/ML: INTRAMUSCULAR | Qty: 30 | Status: AC

## 2019-11-10 MED FILL — Magnesium Sulfate Inj 50%: INTRAMUSCULAR | Qty: 10 | Status: AC

## 2019-11-10 MED FILL — Potassium Chloride Inj 2 mEq/ML: INTRAVENOUS | Qty: 40 | Status: AC

## 2019-11-10 MED FILL — Mannitol IV Soln 20%: INTRAVENOUS | Qty: 500 | Status: AC

## 2019-11-10 MED FILL — Sodium Bicarbonate IV Soln 8.4%: INTRAVENOUS | Qty: 50 | Status: AC

## 2019-11-10 MED FILL — Electrolyte-R (PH 7.4) Solution: INTRAVENOUS | Qty: 5000 | Status: AC

## 2019-11-10 NOTE — Discharge Instructions (Signed)
Coronary Artery Bypass Grafting, Care After This sheet gives you information about how to care for yourself after your procedure. Your doctor may also give you more specific instructions. If you have problems or questions, call your doctor. What can I expect after the procedure? After the procedure, it is common to:  Feel sick to your stomach (nauseous).  Not want to eat as much as normal (lack of appetite).  Have trouble pooping (constipation).  Have weakness and tiredness (fatigue).  Feel sad (depressed) or grouchy (irritable).  Have pain or discomfort around the cuts from surgery (incisions). Follow these instructions at home: Medicines  Take over-the-counter and prescription medicines only as told by your doctor. Do not stop taking medicines or start any new medicines unless your doctor says it is okay.  If you were prescribed an antibiotic medicine, take it as told by your doctor. Do not stop taking the antibiotic even if you start to feel better. Incision care   Follow instructions from your doctor about how to take care of your cuts from surgery. Make sure you: ? Wash your hands with soap and water before and after you change your bandage (dressing). If you cannot use soap and water, use hand sanitizer. ? Change your bandage as told by your doctor. ? Leave stitches (sutures), skin glue, or skin tape (adhesive) strips in place. They may need to stay in place for 2 weeks or longer. If tape strips get loose and curl up, you may trim the loose edges. Do not remove tape strips completely unless your doctor says it is okay.  Make sure the surgery cuts are clean, dry, and protected.  Check your cut areas every day for signs of infection. Check for: ? More redness, swelling, or pain. ? More fluid or blood. ? Warmth. ? Pus or a bad smell.  If cuts were made in your legs: ? Avoid crossing your legs. ? Avoid sitting for long periods of time. Change positions every 30  minutes. ? Raise (elevate) your legs when you are sitting. Bathing  Do not take baths, swim, or use a hot tub until your doctor says it is okay.  You may shower. Pat the surgery cuts dry. Do not rub the cuts to dry.  Eating and drinking   Eat foods that are high in fiber, such as beans, nuts, whole grains, and raw fruits and vegetables. Any meats you eat should be lean cut. Avoid canned, processed, and fried foods. This can help prevent trouble pooping. This is also a part of a heart-healthy diet.  Drink enough fluid to keep your pee (urine) pale yellow.  Do not drink alcohol until you are fully recovered. Ask your doctor when it is safe to drink alcohol. Activity  Rest and limit your activity as told by your doctor. You may be told to: ? Stop any activity right away if you have chest pain, shortness of breath, irregular heartbeats, or dizziness. Get help right away if you have any of these symptoms. ? Move around often for short periods or take short walks as told by your doctor. Slowly increase your activities. ? Avoid lifting, pushing, or pulling anything that is heavier than 10 lb (4.5 kg) for at least 6 weeks or as told by your doctor.  Do physical therapy or a cardiac rehab (cardiac rehabilitation) program as told by your doctor. ? Physical therapy involves doing exercises to maintain movement and build strength and endurance. ? A cardiac rehab program includes:  Exercise   training.  Education.  Counseling.  Do not drive until your doctor says it is okay.  Ask your doctor when you can go back to work.  Ask your doctor when you can be sexually active. General instructions  Do not drive or use heavy machinery while taking prescription pain medicine.  Do not use any products that contain nicotine or tobacco. These include cigarettes, e-cigarettes, and chewing tobacco. If you need help quitting, ask your doctor.  Take 2-3 deep breaths every few hours during the day while  you get better. This helps expand your lungs and prevent problems.  If you were given a device called an incentive spirometer, use it several times a day to practice deep breathing. Support your chest with a pillow or your arms when you take deep breaths or cough.  Wear compression stockings as told by your doctor.  Weigh yourself every day. This helps to see if your body is holding (retaining) fluid that may make your heart and lungs work harder.  Keep all follow-up visits as told by your doctor. This is important. Contact a doctor if:  You have more redness, swelling, or pain around any cut.  You have more fluid or blood coming from any cut.  Any cut feels warm to the touch.  You have pus or a bad smell coming from any cut.  You have a fever.  You have swelling in your ankles or legs.  You have pain in your legs.  You gain 2 lb (0.9 kg) or more a day.  You feel sick to your stomach or you throw up (vomit).  You have watery poop (diarrhea). Get help right away if:  You have chest pain that goes to your jaw or arms.  You are short of breath.  You have a fast or irregular heartbeat.  You notice a "clicking" in your breastbone (sternum) when you move.  You have any signs of a stroke. "BE FAST" is an easy way to remember the main warning signs: ? B - Balance. Signs are dizziness, sudden trouble walking, or loss of balance. ? E - Eyes. Signs are trouble seeing or a change in how you see. ? F - Face. Signs are sudden weakness or loss of feeling of the face, or the face or eyelid drooping on one side. ? A - Arms. Signs are weakness or loss of feeling in an arm. This happens suddenly and usually on one side of the body. ? S - Speech. Signs are sudden trouble speaking, slurred speech, or trouble understanding what people say. ? T - Time. Time to call emergency services. Write down what time symptoms started.  You have other signs of a stroke, such as: ? A sudden, very bad  headache with no known cause. ? Feeling sick to your stomach. ? Throwing up. ? Jerky movements you cannot control (seizure). These symptoms may be an emergency. Do not wait to see if the symptoms will go away. Get medical help right away. Call your local emergency services (911 in the [place] Do not drive yourself to the hospital. Summary  After the procedure, it is common to have pain or discomfort in the cuts from surgery (incisions).  Do not take baths, swim, or use a hot tub until your doctor says it is okay.  Slowly increase your activities. You may need physical therapy or cardiac rehab.  Weigh yourself every day. This helps to see if your body is holding fluid. This information is not intended   to replace advice given to you by your health care provider. Make sure you discuss any questions you have with your health care provider. Document Revised: 03/30/2018 Document Reviewed: 03/30/2018 Elsevier Patient Education  2020 [employer]  

## 2019-11-10 NOTE — Progress Notes (Signed)
Removed epicardial wires per order. 4 intact.  Pt tolerated procedure well.  Pt instructed to remain on bedrest for one hour.  Frequent vitals will be taken and documented. Pt resting with call bell within reach. ° °

## 2019-11-10 NOTE — Progress Notes (Addendum)
[address]       [place] [address]             [phone]      3 Days Post-Op Procedure(s) (LRB): CORONARY ARTERY BYPASS GRAFTING (CABG) x 2 WITH BILATERAL INTERNAL MAMMARY ARTERIES. LIMA TO LAD, RIMA TO DISTAL RCA (N/A) Transesophageal Echocardiogram (Tee) (N/A) Subjective: Feels pretty well overall, says he is wheezing at times  Objective: Vital signs in last 24 hours: Temp:  [97.8 F (36.6 C)-100.9 F (38.3 C)] 97.8 F (36.6 C) (04/08 0415) Pulse Rate:  [99-123] 110 (04/08 0805) Cardiac Rhythm: Sinus tachycardia (04/08 0700) Resp:  [18-41] 21 (04/08 0636) BP: (115-149)/(63-119) 126/83 (04/08 0805) SpO2:  [89 %-99 %] 94 % (04/08 0636) Weight:  [063.0 kg] 114.2 kg (04/08 0421)  Hemodynamic parameters for last 24 hours:    Intake/Output from previous day: 04/07 0701 - 04/08 0700 In: 532.6 [P.O.:490; I.V.:2.4; IV Piggyback:40.2] Out: 870 [Urine:870] Intake/Output this shift: No intake/output data recorded.  General appearance: alert, cooperative and no distress Heart: regular rate and rhythm and tachy Lungs: a few scattered wheezes, o/w clear Abdomen: benign Extremities: no edema Wound: incis healing well, some old blood on sternal dressing  Lab Results: Recent Labs    11/09/19 0352 11/10/19 0308  WBC 9.1 8.9  HGB 10.9* 10.0*  HCT 33.0* 29.8*  PLT 179 199   BMET:  Recent Labs    11/09/19 0352 11/10/19 0308  NA 138 136  K 3.6 3.9  CL 102 100  CO2 25 26  GLUCOSE 125* 122*  BUN 19 17  CREATININE 1.05 1.09  CALCIUM 8.0* 8.0*    PT/INR:  Recent Labs    11/07/19 1411  LABPROT 15.7*  INR 1.3*   ABG    Component Value Date/Time   PHART 7.388 11/07/2019 1614   HCO3 22.7 11/07/2019 1614   TCO2 24 11/07/2019 1614   ACIDBASEDEF 2.0 11/07/2019 1614   O2SAT 97.0 11/07/2019 1614   CBG (last 3)  Recent Labs    11/08/19 2345 11/09/19 0351 11/09/19 0901  GLUCAP 118* 118* 133*    Meds Scheduled Meds: . atorvastatin   40 mg Oral q1800  . Chlorhexidine Gluconate Cloth  6 each Topical Daily  . colchicine  0.6 mg Oral BID  . docusate sodium  200 mg Oral Daily  . folic acid  1 mg Oral Daily  . lisinopril  5 mg Oral Daily  . metoprolol tartrate  25 mg Oral BID  . pantoprazole  40 mg Oral QAC breakfast  . sodium chloride flush  3 mL Intravenous Q12H  . thiamine  100 mg Oral Daily   Or  . thiamine  100 mg Intravenous Daily   Continuous Infusions: . sodium chloride     PRN Meds:.sodium chloride, alum & mag hydroxide-simeth, bisacodyl **OR** bisacodyl, magnesium hydroxide, ondansetron **OR** ondansetron (ZOFRAN) IV, sodium chloride flush, traMADol, zolpidem  Xrays DG Chest Port 1 View  Result Date: 11/09/2019 CLINICAL DATA:  Chest pain following coronary bypass grafting EXAM: PORTABLE CHEST 1 VIEW COMPARISON:  11/08/2019 FINDINGS: Swan-Ganz catheter has been removed in the interval. Right jugular catheter remains. These dental drain and bilateral thoracostomy catheters have been removed in the interval. No pneumothorax is noted. Minimal left basilar atelectasis is again seen. No bony abnormality is noted. IMPRESSION: Interval removal of tubes and lines as described. No pneumothorax is seen. Mild left basilar atelectasis. Electronically Signed   By: Alcide Clever M.D.   On:  11/09/2019 09:10    Assessment/Plan: S/P Procedure(s) (LRB): CORONARY ARTERY BYPASS GRAFTING (CABG) x 2 WITH BILATERAL INTERNAL MAMMARY ARTERIES. LIMA TO LAD, RIMA TO DISTAL RCA (N/A) Transesophageal Echocardiogram (Tee) (N/A)  1 conts to progress well , POD#3 2 tachy and some hypertensve readings at times- will up-titrate ACE-I/beta blocker 3 add prn xopenex for wheeze 4 sats in 80's at times- cont pulm toilet/rehab 5 H/H down a little further- hopefully just equilibrating , monitor clinically 6 renal fxn normal- does not appear volume overloaded 7 tmax 100.9, no leukocytosis- will check UA 8 BS well controlled- not a diabetic 9 d/c  epw's 10 poss home in am  LOS: 7 days    John Giovanni PA-C Pager [phone] 11/10/2019   Agree with above Doing well [place]

## 2019-11-10 NOTE — Discharge Summary (Signed)
Physician Discharge Summary  Patient ID: Sean Flynn MRN: [id number] DOB/AGE: [birth year] 54 y.o.  Admit date: 11/03/2019 Discharge date: 11/11/2019  Admission Diagnoses:  Discharge Diagnoses:  Active Problems:   Acute coronary syndrome [facility])   Past Medical History:  Diagnosis Date  . Anxiety   . Former smoker   . Hypertension   . Mixed hyperlipidemia    HPI:   54 y/o Caucasian male with hypertension, hyperlipidemia, former smoker, EtOH overuse, admitted with off and on chest pain for 3 days. Patient has had exertional dyspnea for last 6 months, but chest pain did not occur until 3 days ago. Pain did not improve with TUMS, so patient finally called EMS today. EKG showed transient inferior ST elevation. He was emergently taken to cath lab.   Coronary angiogram showed severe calcific prox-mid RCA, severe calcific ostial-mid LAD disease, moderate calcific LCx stenoses. Patient is currently chest pain free with resolution of ST elevation.   Patient is a retired Copywriter, advertising. He and his wife moved from [place] to [place], [place] in July 2020. He has 6 daughters and one son, all live out of state.   He recently underwent CT cardiac scoring, that showed total calcium of 5002.  Cardiothoracic surgical consultation was obtained and he was felt to be a candidate for CABG.   Discharged Condition: good  Hospital Course:  Patient was medically stabilized and on 11/07/2019 taken the operating room where he underwent the below described procedure.  He tolerated well was taken to the SICU in stable condition.  Postoperative hospital course:  The patient is doing well.  He has maintained stable hemodynamics.  He was weaned from the ventilator without difficulty using standard protocols.  All routine lines, monitors and drainage devices have been discontinued in the standard fashion.  He does have a mild expected acute blood loss anemia which is stable.  Renal function is within normal limits.   Oxygen has been weaned and he maintains good saturations on room air.  He has had some sinus tachycardia and beta-blocker has been uptitrated over time.  He has had some hypertension and has been started on an ACE inhibitor.  His statin dose has been increased from his preoperative.  He has had some difficulty with gout but it has improved over time with colchicine.  Pain is otherwise well controlled.  Incisions are noted to be healing well without evidence of infection.  He is tolerating gradually increasing activities using standard cardiac rehab protocols.  At the time of discharge the patient is felt to be quite stable.   Consults: cardiology  Significant Diagnostic Studies: routine post-op serial labs and chest xrays.  Treatments: surgery:  OPERATIVE REPORT  DATE OF PROCEDURE:  11/07/2019  PREOPERATIVE DIAGNOSIS:  Severe 2-vessel coronary disease with unstable coronary syndrome.  POSTOPERATIVE DIAGNOSIS:  Severe 2-vessel coronary disease with unstable coronary syndrome.  PROCEDURES:   1.  Median sternotomy.  2.  Extracorporeal circulation 3.  Coronary artery bypass grafting x 2      Left internal mammary artery to left anterior descending,      Free right internal mammary artery to distal right coronary.  SURGEON:  Charlett Lango, MD   ASSISTANT:  Lowella Dandy, PA  ANESTHESIA:  General. Discharge Exam: Blood pressure (!) 133/93, pulse 98, temperature 98.6 F (37 C), temperature source Oral, resp. rate (!) 23, height 5' 9.02" (1.753 m), weight 114.2 kg, SpO2 94 %.  General appearance: alert, cooperative and no distress Heart: regular rate and rhythm Lungs: min  dim left base, O/W clear without wheeze Abdomen: benign Extremities: no edema Wound: incis healing well Disposition: Discharge disposition: 01-Home or Self Care       Discharge Instructions    Amb Referral to Cardiac Rehabilitation   Complete by: As directed    Diagnosis: CABG   CABG X ___: 2    After initial evaluation and assessments completed: Virtual Based Care may be provided alone or in conjunction with Phase 2 Cardiac Rehab based on patient barriers.: Yes   Discharge patient   Complete by: As directed    Discharge disposition: 01-Home or Self Care   Discharge patient date: 11/11/2019     Allergies as of 11/11/2019      Reactions   Oxycodone-acetaminophen    hallucinations      Medication List    STOP taking these medications   aspirin EC 81 MG tablet Replaced by: aspirin 325 MG tablet   atenolol 50 MG tablet Commonly known as: TENORMIN   HYDROcodone-acetaminophen 5-325 MG tablet Commonly known as: Norco     TAKE these medications   aspirin 325 MG tablet Take 1 tablet (325 mg total) by mouth daily. Replaces: aspirin EC 81 MG tablet   atorvastatin 40 MG tablet Commonly known as: LIPITOR Take 1 tablet (40 mg total) by mouth daily at 6 PM. What changed:   medication strength  how much to take  when to take this   colchicine 0.6 MG tablet Take 0.6 mg by mouth as needed (gout).   lisinopril 10 MG tablet Commonly known as: ZESTRIL Take 1 tablet (10 mg total) by mouth daily.   metoprolol tartrate 50 MG tablet Commonly known as: LOPRESSOR Take 1 tablet (50 mg total) by mouth 2 (two) times daily.   multivitamin tablet Take 1 tablet by mouth at bedtime.   omega-3 fish oil 1000 MG Caps capsule Commonly known as: MAXEPA Take 2 capsules by mouth at bedtime.   traMADol 50 MG tablet Commonly known as: ULTRAM Take 1-2 tablets (50-100 mg total) by mouth every 6 (six) hours as needed for up to 7 days for moderate pain.   zolpidem 5 MG tablet Commonly known as: AMBIEN Take 2.5-5 mg by mouth at bedtime as needed for sleep.            Durable Medical Equipment  (From admission, onward)         Start     Ordered   11/10/19 1421  For home use only DME Walker rolling  Once    Question Answer Comment  Walker: With 5 Inch Wheels   Patient needs a  walker to treat with the following condition Weakness generalized      11/10/19 1420         Follow-up Information    [name], Reynold Bowen, MD Follow up.   Specialties: Cardiology, Radiology Why: Appointment to see your cardiologist on Wednesday, April 28 at 10:45 AM. Contact information: [place] [address] [phone]        Melrose Nakayama, MD Follow up.   Specialty: Cardiothoracic Surgery Contact information: [address] [place] [address] [phone]          The patient has been discharged on:   1.Beta Blocker:  Yes [   y]                              No   [   ]  If No, reason:  2.Ace Inhibitor/ARB: Yes [  y]                                     No  [    ]                                     If No, reason:  3.Statin:   Yes [ y]                  No  [   ]                  If No, reason:  4.Ecasa:  Yes  C[place].Etienne   ]                  No   [   ]                  If No, reason:

## 2019-11-10 NOTE — Progress Notes (Signed)
Came to ambulate again however pt is resting, just woke up. Sts he would like to go later today. Gave HH diet and discussed CRPII. Pt receptive. We also discussed the perks of apple watch for activity/exercise (he just bought one). Will refer to [place] CRPII. Pt would benefit from RW for home. Pt is interested in participating in Virtual Cardiac and Pulmonary Rehab. Pt advised that Virtual Cardiac and Pulmonary Rehab is provided at no cost to the patient.  Checklist:  1. Pt has smart device  ie smartphone and/or ipad for downloading an app  Yes 2. Reliable internet/wifi service    Yes 3. Understands how to use their smartphone and navigate within an app.  Yes Pt verbalized understanding and is in agreement. [phone] Ethelda Chick CES, ACSM 2:12 PM 11/10/2019

## 2019-11-10 NOTE — Progress Notes (Signed)
CARDIAC REHAB PHASE I   PRE:  Rate/Rhythm: 106 ST    BP: sitting 165/107 forearm, 135/108 upper arm    SaO2: 93 RA  MODE:  Ambulation: 470 ft   POST:  Rate/Rhythm: 131 ST    BP: sitting 121/102 upper arm    SaO2: 97 RA   Pt needed assist x1 to stand due to knees. C/o 2/10 pain. Able to ambulate with RW with bent flexion. Sts knees do better with distance. HR and BP elevated. Return to recliner. He is working on IS. Encouraged more walking with RW. He can go with his wife. We will f/u. [phone]  Harriet Masson CES, ACSM 11/10/2019 10:49 AM

## 2019-11-10 NOTE — Progress Notes (Signed)
   11/10/19 1100  Clinical Encounter Type  Visited With Health care provider;Patient not available  Visit Type Other (Comment) (AD paperwork)  Referral From Nurse  Consult/Referral To Chaplain   Chaplain responded to consult for advanced directives. Chaplain was not able to visit with patient at this time, patient was busy. Chaplain asked unit secretary to provide patient with AD paperwork and page chaplains when assistance is needed. Chaplain remains available for support as needs arise.   Chaplain Resident, Amado Coe, M Div [phone] on-call pager

## 2019-11-11 LAB — CBC
HCT: 30.2 % — ABNORMAL LOW (ref 39.0–52.0)
Hemoglobin: 10 g/dL — ABNORMAL LOW (ref 13.0–17.0)
MCH: 31.4 pg (ref 26.0–34.0)
MCHC: 33.1 g/dL (ref 30.0–36.0)
MCV: 95 fL (ref 80.0–100.0)
Platelets: 242 10*3/uL (ref 150–400)
RBC: 3.18 MIL/uL — ABNORMAL LOW (ref 4.22–5.81)
RDW: 12.1 % (ref 11.5–15.5)
WBC: 7.9 10*3/uL (ref 4.0–10.5)
nRBC: 0 % (ref 0.0–0.2)

## 2019-11-11 MED ORDER — ASPIRIN 325 MG PO TABS: 325.0000 mg | ORAL_TABLET | Freq: Every day | ORAL | Status: DC

## 2019-11-11 MED ORDER — TRAMADOL HCL 50 MG PO TABS: 50.0000 mg | ORAL_TABLET | Freq: Four times a day (QID) | ORAL | 0 refills | Status: AC | PRN

## 2019-11-11 MED ORDER — LISINOPRIL 10 MG PO TABS: 10.0000 mg | ORAL_TABLET | Freq: Every day | ORAL | 1 refills | Status: DC

## 2019-11-11 MED ORDER — METOPROLOL TARTRATE 50 MG PO TABS: 50.0000 mg | ORAL_TABLET | Freq: Two times a day (BID) | ORAL | 1 refills | Status: DC

## 2019-11-11 MED ORDER — ATORVASTATIN CALCIUM 40 MG PO TABS: 40.0000 mg | ORAL_TABLET | Freq: Every day | ORAL | 1 refills | Status: DC

## 2019-11-11 NOTE — Progress Notes (Signed)
      [address]       [place] [address]             [phone]      4 Days Post-Op Procedure(s) (LRB): CORONARY ARTERY BYPASS GRAFTING (CABG) x 2 WITH BILATERAL INTERNAL MAMMARY ARTERIES. LIMA TO LAD, RIMA TO DISTAL RCA (N/A) Transesophageal Echocardiogram (Tee) (N/A) Subjective: Feels well  Objective: Vital signs in last 24 hours: Temp:  [97.8 F (36.6 C)-99.6 F (37.6 C)] 98.6 F (37 C) (04/09 0429) Pulse Rate:  [84-110] 98 (04/09 0429) Cardiac Rhythm: Normal sinus rhythm (04/09 0429) Resp:  [18-23] 23 (04/09 0429) BP: (110-158)/(71-93) 133/93 (04/09 0429) SpO2:  [93 %-96 %] 94 % (04/09 0429) Weight:  [114.2 kg] 114.2 kg (04/09 0429)  Hemodynamic parameters for last 24 hours:    Intake/Output from previous day: 04/08 0701 - 04/09 0700 In: 1220 [P.O.:1220] Out: -  Intake/Output this shift: No intake/output data recorded.  General appearance: alert, cooperative and no distress Heart: regular rate and rhythm Lungs: min dim left base, O/W clear without wheeze Abdomen: benign Extremities: no edema Wound: incis healing well  Lab Results: Recent Labs    11/10/19 0308 11/11/19 0308  WBC 8.9 7.9  HGB 10.0* 10.0*  HCT 29.8* 30.2*  PLT 199 242   BMET:  Recent Labs    11/09/19 0352 11/10/19 0308  NA 138 136  K 3.6 3.9  CL 102 100  CO2 25 26  GLUCOSE 125* 122*  BUN 19 17  CREATININE 1.05 1.09  CALCIUM 8.0* 8.0*    PT/INR: No results for input(s): LABPROT, INR in the last 72 hours. ABG    Component Value Date/Time   PHART 7.388 11/07/2019 1614   HCO3 22.7 11/07/2019 1614   TCO2 24 11/07/2019 1614   ACIDBASEDEF 2.0 11/07/2019 1614   O2SAT 97.0 11/07/2019 1614   CBG (last 3)  Recent Labs    11/08/19 2345 11/09/19 0351 11/09/19 0901  GLUCAP 118* 118* 133*    Meds Scheduled Meds: . aspirin  325 mg Oral Daily  . atorvastatin  40 mg Oral q1800  . Chlorhexidine Gluconate Cloth  6 each Topical Daily  . colchicine  0.6 mg  Oral BID  . docusate sodium  200 mg Oral Daily  . folic acid  1 mg Oral Daily  . lisinopril  10 mg Oral Daily  . metoprolol tartrate  50 mg Oral BID  . pantoprazole  40 mg Oral QAC breakfast  . sodium chloride flush  3 mL Intravenous Q12H  . thiamine  100 mg Oral Daily   Or  . thiamine  100 mg Intravenous Daily   Continuous Infusions: . sodium chloride     PRN Meds:.sodium chloride, alum & mag hydroxide-simeth, bisacodyl **OR** bisacodyl, levalbuterol, magnesium hydroxide, ondansetron **OR** ondansetron (ZOFRAN) IV, sodium chloride flush, traMADol, zolpidem  Xrays No results found.  Assessment/Plan: S/P Procedure(s) (LRB): CORONARY ARTERY BYPASS GRAFTING (CABG) x 2 WITH BILATERAL INTERNAL MAMMARY ARTERIES. LIMA TO LAD, RIMA TO DISTAL RCA (N/A) Transesophageal Echocardiogram (Tee) (N/A)   1 conts to do well 2 hemodyn stable with minor tachy / some htn- mostly good control, cont current meds, may need to adjust as outptient 3 sats good on RA 4 no fevers or leukocytosis 5 H/H very stable 6 ok  For d/c- reviewed instructions   LOS: 8 days    Rowe Clack PA-C Pager [phone] 11/11/2019

## 2019-11-11 NOTE — Progress Notes (Signed)
Discussed IS, sternal precautions, diet, exercise, and CRPII with pt and wife. Very receptive. Will refer to [place] CRPII. Pt is interested in participating in Virtual Cardiac and Pulmonary Rehab. Pt advised that Virtual Cardiac and Pulmonary Rehab is provided at no cost to the patient.  Checklist:  1. Pt has smart device  ie smartphone and/or ipad for downloading an app  Yes 2. Reliable internet/wifi service    Yes 3. Understands how to use their smartphone and navigate within an app.  Yes Pt verbalized understanding and is in agreement. [id number] Ethelda Chick CES, ACSM 10:20 AM 11/11/2019

## 2019-11-11 NOTE — Progress Notes (Signed)
Plan of care reviewed. Pt's been progressing. Vital signs stable. Remained afebrile. Sinus rhythm on monitor, HR 90s at rest, when ambulated HR 120s. SPO2 94-98% on room air.   Sternal wound had minimal serosanguinous drainage. Cleaned with Betadine, dry gauze dressing applied. Denied chest pain. His pain was more on his right knee, pain scale 4/10 tolerated well. No acute distress noted. Continue to monitor.  Yizel Canby,RN

## 2019-11-11 NOTE — Progress Notes (Signed)
Discharged to home with family office visits in place teaching done  

## 2019-11-14 ENCOUNTER — Other Ambulatory Visit: Payer: Self-pay

## 2019-11-14 ENCOUNTER — Ambulatory Visit: Payer: 59 | Admitting: Cardiology

## 2019-11-14 ENCOUNTER — Ambulatory Visit: Payer: BC Managed Care – PPO | Admitting: Cardiology

## 2019-11-14 ENCOUNTER — Encounter: Payer: Self-pay | Admitting: Cardiology

## 2019-11-14 VITALS — BP 142/82 | HR 66 | Temp 97.9°F | Resp 16 | Ht 69.0 in | Wt 246.0 lb

## 2019-11-14 DIAGNOSIS — I25708 Atherosclerosis of coronary artery bypass graft(s), unspecified, with other forms of angina pectoris: Secondary | ICD-10-CM | POA: Insufficient documentation

## 2019-11-14 DIAGNOSIS — E7841 Elevated Lipoprotein(a): Secondary | ICD-10-CM | POA: Diagnosis not present

## 2019-11-14 DIAGNOSIS — Z8249 Family history of ischemic heart disease and other diseases of the circulatory system: Secondary | ICD-10-CM

## 2019-11-14 DIAGNOSIS — K219 Gastro-esophageal reflux disease without esophagitis: Secondary | ICD-10-CM | POA: Diagnosis not present

## 2019-11-14 MED ORDER — PANTOPRAZOLE SODIUM 40 MG PO TBEC: 40.0000 mg | DELAYED_RELEASE_TABLET | Freq: Every day | ORAL | 2 refills | Status: DC

## 2019-11-14 MED ORDER — METOPROLOL TARTRATE 25 MG PO TABS: 50.0000 mg | ORAL_TABLET | Freq: Two times a day (BID) | ORAL | 2 refills | Status: DC

## 2019-11-14 NOTE — Progress Notes (Signed)
 Follow up visit  Subjective:   Sean Flynn, male    DOB: 06/28/1966, 54 y.o.   MRN: [id number]   HPI  Chief Complaint  Patient presents with  . Low BP  . Shortness of Breath     54 y/o Caucasian male with hypertension, hyperlipidemia, former smoker, h/o EtOH overuse, h/o gout,ACS with transient SAT elevation-->s/p CABGX2 (LIMA-LAD, RIMA-RCA) in 10/2019, PAD w/b/l SFA stenoses.  Patient made an urgent appointment today for complaints of shortness of breath and lightheadedness. Symptoms occurred while climbing up a hill, and have since resolved. He denies any chest pain. He has had some acid reflux symptoms.   Current Outpatient Medications on File Prior to Visit  Medication Sig Dispense Refill  . aspirin 325 MG tablet Take 1 tablet (325 mg total) by mouth daily.    . atorvastatin (LIPITOR) 40 MG tablet Take 1 tablet (40 mg total) by mouth daily at 6 PM. 30 tablet 1  . colchicine 0.6 MG tablet Take 0.6 mg by mouth as needed (gout).     . lisinopril (ZESTRIL) 10 MG tablet Take 1 tablet (10 mg total) by mouth daily. 30 tablet 1  . metoprolol tartrate (LOPRESSOR) 50 MG tablet Take 1 tablet (50 mg total) by mouth 2 (two) times daily. 60 tablet 1  . Multiple Vitamin (MULTIVITAMIN) tablet Take 1 tablet by mouth at bedtime.     . omega-3 fish oil (MAXEPA) 1000 MG CAPS capsule Take 2 capsules by mouth at bedtime.     . traMADol (ULTRAM) 50 MG tablet Take 1-2 tablets (50-100 mg total) by mouth every 6 (six) hours as needed for up to 7 days for moderate pain. 28 tablet 0  . zolpidem (AMBIEN) 5 MG tablet Take 2.5-5 mg by mouth at bedtime as needed for sleep.      No current facility-administered medications on file prior to visit.    Cardiovascular & other pertient studies:  EKG 11/14/2019: Sinus tachycardia 106 bpm. Inferior infarct, old. Nonspecific ST-T changes anteroseptal leads.   Op note 11/07/2019 (Dr. Hendrickson): 1.  Median sternotomy.  2.  Extracorporeal circulation 3.   Coronary artery bypass grafting x 2      Left internal mammary artery to left anterior descending,      Free right internal mammary artery to distal right coronary.   LEA duplex 11/04/2019: Patent distal abdominal aorta and bilateral iliac arteries without  evidence of significant stenosis.  Right: 75-99% stenosis noted in the superficial femoral artery. Posterior  tibial artery appears occluded.  Left: 75-99% stenosis noted in the superficial femoral artery. Posterior  tibial artery appears occluded.      Echocardiogram 11/04/2019: 1. Mild LVH. Normal LV size. Mild global hypokinesis. LVEF 45-50%. Normal  diastolic function.  2. Normal RV size and function.  3. No significant valvular abnormality.  4. Normal right atrial pressure.   Coronary angiogram 11/03/2019: LM: Normal LAD: Severe ostial to mid calcification with long 50-75% calcified lesion upto mid LAD. Distal vessel is spared of calcification, with excellent distal surgical target.  LCx: Mid calcific 50% stenosis. RCA: Severely calcified prox CTO with a narrow antegrade collateral, adequate to maintain TIMI III flow at rest, follwowed by log mid 60-80% long calcified lesion. Distal vessel is spared of calcification, with excellent distal surgical target.   LVEDP 20 mmHg  Recent labs: 11/10/2019: Glucose 122, BUN/Cr 17/1.09. EGFR >60. Na/K 136/3.9. Rest of the CMP normal H/H 10/30. MCV 95. Platelets 242 HbA1C 5.5% Chol 144, TG 317, HDL   54, LDL 27 Lipoprotein (a) 213, elevated    Review of Systems  Cardiovascular: Positive for dyspnea on exertion. Negative for chest pain, leg swelling, palpitations and syncope.  Neurological: Positive for light-headedness.        Vitals:   11/14/19 1420  BP: (!) 142/82  Pulse: 66  Resp: 16  Temp: 97.9 F (36.6 C)  SpO2: 94%     Body mass index is 36.33 kg/m. Filed Weights   11/14/19 1420  Weight: 246 lb (111.6 kg)     Objective:   Physical Exam    Constitutional: He appears well-developed and well-nourished.  Neck: No JVD present.  Cardiovascular: Normal rate, regular rhythm and normal heart sounds.  No murmur heard. Pulses:      Femoral pulses are 2+ on the right side and 2+ on the left side.      Popliteal pulses are 0 on the right side and 1+ on the left side.       Dorsalis pedis pulses are 0 on the right side and 0 on the left side.       Posterior tibial pulses are 0 on the right side and 0 on the left side.  Pulmonary/Chest: Effort normal and breath sounds normal. He has no wheezes. He has no rales.  Sternotomy scar, healing well Minimal oozing, no discharge. No tenderness, erythema.  Musculoskeletal:        General: No edema.  Nursing note and vitals reviewed.       Assessment & Recommendations:   54 y/o Caucasian male with hypertension, hyperlipidemia, former smoker, h/o EtOH overuse, h/o gout,ACS with transient SAT elevation-->s/p CABGX2 (LIMA-LAD, RIMA-RCA) in 10/2019, PAD w/b/l SFA stenoses.  Hypotension: Resolved. I suspect this may have been related to dehydration. Reduce metoprolol tartarate to 25 mg bid.  CAD: S/p CABGX2 (LIMA-LAD, RIMA-RCA). Continue Aspirin 325 mg daily for now. At next visit in a month, I will reduce this to 81 mg daily, and add low dose Xarelto 2.5 mg bid.  Continue lisinopril 10 mg daily, lipitor 80 mg daily.   PAD:  Severe claudication symptoms at baseline. No critical limb ischemia.  Continue medical management. Encourage regular walking.   Elevated lipoprotein (a): Conitnue statin for now. Will enroll in HORIZON trial in July  F/u w/me in May/June  Manish J [name], MD [facility] Pager: [phone] Office: [phone]    

## 2019-11-15 ENCOUNTER — Telehealth (HOSPITAL_COMMUNITY): Payer: Self-pay

## 2019-11-15 NOTE — Telephone Encounter (Signed)
Pt insurance is active and benefits verified through [facility]. Co-pay $0.00, DED $250.00/$250.00 met, out of pocket $2,000.00/$353.02 met, co-insurance 20%. No pre-authorization required. Passport, 11/15/2019 @ 10:17AM, FMM#[id number]  Will contact patient to see if he is interested in the Cardiac Rehab Program. If interested, patient will need to complete follow up appt. Once completed, patient will be contacted for scheduling upon review by the RN Navigator.

## 2019-11-15 NOTE — Telephone Encounter (Signed)
Called patient to see if he is interested in the Cardiac Rehab Program. Patient expressed interest. Explained scheduling process and went over insurance, patient verbalized understanding. Will contact patient for scheduling once f/u has been completed.  °

## 2019-11-18 NOTE — Telephone Encounter (Signed)
Yes I would recommend seeing orthopedics.  Meriam Sprague, please refer to [facility] for Right knee pain. Seen by them in the hospital.

## 2019-11-21 ENCOUNTER — Telehealth: Payer: Self-pay

## 2019-11-21 NOTE — Telephone Encounter (Signed)
S/w pt advised him ok to stop Metoprolol per MP. Also told him referral was placed and he should hear something soon.

## 2019-11-21 NOTE — Telephone Encounter (Signed)
Staff, please follow up on orthopedic referral.  Okay by me to stop metoprolol pending further workup.   Thanks MJP

## 2019-11-23 DIAGNOSIS — M109 Gout, unspecified: Secondary | ICD-10-CM | POA: Insufficient documentation

## 2019-11-24 ENCOUNTER — Ambulatory Visit (INDEPENDENT_AMBULATORY_CARE_PROVIDER_SITE_OTHER): Payer: BC Managed Care – PPO | Admitting: Family Medicine

## 2019-11-24 ENCOUNTER — Encounter: Payer: Self-pay | Admitting: Family Medicine

## 2019-11-24 ENCOUNTER — Other Ambulatory Visit: Payer: Self-pay

## 2019-11-24 DIAGNOSIS — M1A0610 Idiopathic chronic gout, right knee, without tophus (tophi): Secondary | ICD-10-CM | POA: Diagnosis not present

## 2019-11-24 MED ORDER — PREDNISONE 10 MG PO TABS: ORAL_TABLET | ORAL | 0 refills | Status: DC

## 2019-11-24 NOTE — Progress Notes (Signed)
Office Visit Note   Patient: Sean Flynn           Date of Birth: 03/01/1966           MRN: [id number] Visit Date: 11/24/2019 Requested by: Elizabeth Palau, FNP [address] [place],  [place] [address] PCP: Elizabeth Palau, FNP  Subjective: Chief Complaint  Patient presents with  . Right Knee - Pain    Gout flare up in right knee and down to the ankle. Started after starting meds post heart attack. Pain went from a 12 to a 2-3 after stopping the Metoprolol 4 doses ago (per cardiologist). Wearing open patella knee sleeve. Ambulates with rolling walker.    HPI: He is here with right knee pain.  Earlier this month he underwent bypass surgery for a CT calcium score of more than 5000.  He was placed on metoprolol postoperatively for blood pressure control.  Ever since then he has been experiencing a gout flare in his right knee.  His knee was aspirated while in the hospital but no cortisone was given.  He has continued to have pain requiring a walker for ambulation.  A few days ago he read the side effects of metoprolol and realized that it could be contributing to his pain so he stopped it after discussing with his cardiologist, and his pain is substantially better.  He still has pain but he feels like it is heading in the right direction.  In the past his gout attacks have only been about once or twice per year.  They usually affect his knees.  He is also currently taking colchicine twice daily.  He moved here from [place] last summer after retiring.               ROS:   All other systems were reviewed and are negative.  Objective: Vital Signs: There were no vitals taken for this visit.  Physical Exam:  General:  Alert and oriented, in no acute distress. Pulm:  Breathing unlabored. Psy:  Normal mood, congruent affect.  Right knee: 1+ effusion with no warmth or erythema.  He is tender primarily over the lateral joint line.  Full active extension and flexion 125  degrees.  Imaging: No results found.  Assessment & Plan: 1.  Improving gouty arthropathy right knee -At this point he wants to continue with colchicine for the next few days and see if his pain will resolve now that he is off metoprolol.  In case he continues having pain, I called in a prednisone taper. -If prednisone still does not eliminate his symptoms, then we will do a cortisone injection.  I will plan on seeing him back as needed.     Procedures: No procedures performed  No notes on file     PMFS History: Patient Active Problem List   Diagnosis Date Noted  . Gout 11/23/2019  . 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 Coronary artery disease of bypass graft of native heart with stable angina pectoris (HCC) 11/14/2019  . Family history of early CAD 11/14/2019  . Acute coronary syndrome (HCC) 11/03/2019  . Chronic bilateral low back pain with left-sided sciatica 10/14/2019  . Family history of colon cancer 10/14/2019  . Hyperglycemia 10/14/2019  . Numbness 10/14/2019  . Visual changes 10/14/2019  . Chronic gout of multiple sites 06/15/2019  . Chronic mid back pain 06/15/2019  . Elevated liver function tests 06/15/2019  . Laceration of extensor muscle, fascia and tendon of right thumb at wrist and  hand level, initial encounter 04/05/2019  . Severe obesity (BMI 35.0-39.9) with comorbidity ([place]) 01/18/2019  . Atherosclerosis of aorta ([place]) 11/16/2017  . Alcohol use 06/11/2016  . Atypical chest pain 06/11/2016  . Benign essential HTN 06/11/2016  . Insomnia 06/11/2016   Past Medical History:  Diagnosis Date  . Anxiety   . Former smoker   . Hypertension   . Mixed hyperlipidemia     Family History  Problem Relation Age of Onset  . CAD Father        MI in early 31s  . Cancer Father   . Heart failure Father   . Dementia Mother   . Healthy Brother     Past Surgical History:  Procedure Laterality Date  . CORONARY ARTERY BYPASS GRAFT N/A 11/07/2019   Procedure:  CORONARY ARTERY BYPASS GRAFTING (CABG) x 2 WITH BILATERAL INTERNAL MAMMARY ARTERIES. LIMA TO LAD, RIMA TO DISTAL RCA;  Surgeon: Melrose Nakayama, MD;  Location: [facility];  Service: Open Heart Surgery;  Laterality: N/A;  . LEFT HEART CATH AND CORONARY ANGIOGRAPHY N/A 11/03/2019   Procedure: LEFT HEART CATH AND CORONARY ANGIOGRAPHY;  Surgeon: Nigel Mormon, MD;  Location: [facility];  Service: Cardiovascular;  Laterality: N/A;  . REPAIR EXTENSOR TENDON Right 04/07/2019   Procedure: REPAIR RIGHT THUMB EXTENSOR TENDON;  Surgeon: [name], MD;  Location: [facility];  Service: Orthopedics;  Laterality: Right;  . TEE WITHOUT CARDIOVERSION N/A 11/07/2019   Procedure: Transesophageal Echocardiogram (Tee);  Surgeon: Melrose Nakayama, MD;  Location: [facility];  Service: Open Heart Surgery;  Laterality: N/A;   Social History   Occupational History  . Not on file  Tobacco Use  . Smoking status: Former Smoker    Packs/day: 1.00    Years: 25.00    Pack years: 25.00    Types: Cigarettes  . Smokeless tobacco: Never Used  Substance and Sexual Activity  . Alcohol use: Yes    Alcohol/week: 6.0 - 10.0 standard drinks    Types: 6 - 10 Cans of beer per week    Comment: Daily  . Drug use: Never  . Sexual activity: Not on file

## 2019-11-28 NOTE — Telephone Encounter (Signed)
Please read

## 2019-11-30 ENCOUNTER — Ambulatory Visit: Payer: Self-pay | Admitting: Cardiology

## 2019-12-02 DIAGNOSIS — M25561 Pain in right knee: Secondary | ICD-10-CM | POA: Diagnosis not present

## 2019-12-02 DIAGNOSIS — M25469 Effusion, unspecified knee: Secondary | ICD-10-CM | POA: Diagnosis not present

## 2019-12-05 ENCOUNTER — Other Ambulatory Visit: Payer: Self-pay | Admitting: Thoracic Surgery (Cardiothoracic Vascular Surgery)

## 2019-12-05 DIAGNOSIS — I25708 Atherosclerosis of coronary artery bypass graft(s), unspecified, with other forms of angina pectoris: Secondary | ICD-10-CM

## 2019-12-06 ENCOUNTER — Ambulatory Visit (INDEPENDENT_AMBULATORY_CARE_PROVIDER_SITE_OTHER): Payer: Self-pay | Admitting: Thoracic Surgery (Cardiothoracic Vascular Surgery)

## 2019-12-06 ENCOUNTER — Other Ambulatory Visit: Payer: Self-pay

## 2019-12-06 ENCOUNTER — Ambulatory Visit
Admission: RE | Admit: 2019-12-06 | Discharge: 2019-12-06 | Disposition: A | Discharge status: Home / Self Care | Payer: BC Managed Care – PPO | Source: Ambulatory Visit | Attending: Thoracic Surgery (Cardiothoracic Vascular Surgery) | Admitting: Thoracic Surgery (Cardiothoracic Vascular Surgery)

## 2019-12-06 VITALS — BP 130/80 | HR 87 | Temp 97.6°F | Resp 20 | Ht 69.0 in | Wt 226.0 lb

## 2019-12-06 DIAGNOSIS — I2510 Atherosclerotic heart disease of native coronary artery without angina pectoris: Secondary | ICD-10-CM | POA: Diagnosis not present

## 2019-12-06 DIAGNOSIS — Z951 Presence of aortocoronary bypass graft: Secondary | ICD-10-CM

## 2019-12-06 DIAGNOSIS — I25708 Atherosclerosis of coronary artery bypass graft(s), unspecified, with other forms of angina pectoris: Secondary | ICD-10-CM

## 2019-12-06 NOTE — Progress Notes (Signed)
[address]       [place] [address]             [phone]     HPI: Mr. Yepiz returns for a scheduled follow-up visit  Jaeceon Michelin is a 54 year old man with a past history of hypertension, hyperlipidemia, and tobacco abuse.  He presented on 11/03/2019 with the a STEMI.  He was taken emergently to the catheterization laboratory and found to have severe two-vessel coronary disease.  He had some moderate plaque in the circumflex.  He developed gout while in the hospital.  I did coronary bypass grafting x2 with bilateral mammaries on 11/07/2019.  His postoperative course was unremarkable and he went home on day 4.  Post discharge he continued to have problems with gout.  He finally figured out that it was due to metoprolol.  It was first decreased and then stopped altogether.  His symptoms improved since that was discontinued.  He has some mild discomfort around his sternal incision.  This gotten worse over the past week or so as he has been rolling on his sides in bed.  He has not had any recurrent anginal symptoms.  Past Medical History:  Diagnosis Date  . Anxiety   . Former smoker   . Hypertension   . Mixed hyperlipidemia     Current Outpatient Medications  Medication Sig Dispense Refill  . aspirin 325 MG tablet Take 1 tablet (325 mg total) by mouth daily.    Marland Kitchen atorvastatin (LIPITOR) 40 MG tablet Take 1 tablet (40 mg total) by mouth daily at 6 PM. 30 tablet 1  . colchicine 0.6 MG tablet Take 0.6 mg by mouth as needed (gout).     Marland Kitchen lisinopril (ZESTRIL) 10 MG tablet Take 1 tablet (10 mg total) by mouth daily. 30 tablet 1  . Multiple Vitamin (MULTIVITAMIN) tablet Take 1 tablet by mouth at bedtime.     Marland Kitchen omega-3 fish oil (MAXEPA) 1000 MG CAPS capsule Take 2 capsules by mouth at bedtime.     . pantoprazole (PROTONIX) 40 MG tablet Take 1 tablet (40 mg total) by mouth daily. 60 tablet 2  . zolpidem (AMBIEN) 5 MG tablet Take 2.5-5 mg by mouth at bedtime as needed for  sleep.     . predniSONE (DELTASONE) 10 MG tablet Take as directed for 12 days.  Daily dose 6,6,5,5,4,4,3,3,2,2,1,1. (Patient not taking: Reported on 12/06/2019) 42 tablet 0   No current facility-administered medications for this visit.    Physical Exam BP 130/80   Pulse 87   Temp 97.6 F (36.4 C) (Skin)   Resp 20   Ht 5\' 9"  (1.753 m)   Wt 226 lb (102.5 kg)   SpO2 95% Comment: RA  BMI 33.12 kg/m  54 year old man in no acute distress Alert and oriented x3 with no focal deficits Lungs clear with equal breath sounds bilaterally Cardiac regular rate and rhythm normal S1 and S2 Sternum stable, incision well-healed No peripheral edema  Diagnostic Tests: CHEST - 2 VIEW  COMPARISON:  November 09, 2019.  FINDINGS: The heart size and mediastinal contours are within normal limits. Status post coronary bypass graft. Atherosclerosis of thoracic aorta is noted. Both lungs are clear. The visualized skeletal structures are unremarkable.  IMPRESSION: No active cardiopulmonary disease.  Aortic Atherosclerosis (ICD10-I70.0).   Electronically Signed   By: November 11, 2019 M.D.   On: 12/06/2019 10:37 I personally reviewed the chest x-ray images and concur with the findings noted above.  Impression: Ustin Cruickshank is a 54 year old man with multiple cardiac risk factors including history of tobacco abuse, hypertension, and hyperlipidemia.  He presented with unstable chest pain and ruled in for an ST elevation MI.  He underwent catheterization which revealed severe two-vessel coronary disease.  He underwent coronary artery bypass grafting x2 with bilateral mammary arteries on 11/07/2019.  His postoperative course was unremarkable except for gout which had started preoperatively.  He currently is doing well.  He is walking about a mile a day with his wife.  His main limitation has been pain in the knee.  That is improved since his metoprolol was stopped.  He is on aspirin, statin, and  lisinopril, so he should do fine without the metoprolol.  He may begin driving.  Appropriate precautions were discussed.  He should not lift anything over 10 pounds for another 2 weeks, and then after that he can gradually start to increase his upper body activities.  I encouraged him to continue to walk and gradually increase his distance and pace over time.   Plan: Follow-up with Dr. Virgina Jock I will be happy to see Mr. Sandler back anytime in the future if I can be of any further assistance with his care.  Melrose Nakayama, MD [facility] [phone]

## 2019-12-13 DIAGNOSIS — M25561 Pain in right knee: Secondary | ICD-10-CM | POA: Diagnosis not present

## 2019-12-15 ENCOUNTER — Encounter (HOSPITAL_COMMUNITY): Payer: Self-pay | Admitting: Emergency Medicine

## 2019-12-15 ENCOUNTER — Emergency Department (HOSPITAL_COMMUNITY): Payer: BC Managed Care – PPO

## 2019-12-15 ENCOUNTER — Emergency Department (HOSPITAL_COMMUNITY)
Admission: EM | Admit: 2019-12-15 | Discharge: 2019-12-16 | Disposition: A | Discharge status: Home / Self Care | Payer: BC Managed Care – PPO | Attending: Emergency Medicine | Admitting: Emergency Medicine

## 2019-12-15 ENCOUNTER — Other Ambulatory Visit: Payer: Self-pay

## 2019-12-15 DIAGNOSIS — Z7982 Long term (current) use of aspirin: Secondary | ICD-10-CM | POA: Diagnosis not present

## 2019-12-15 DIAGNOSIS — Z951 Presence of aortocoronary bypass graft: Secondary | ICD-10-CM | POA: Insufficient documentation

## 2019-12-15 DIAGNOSIS — Z79899 Other long term (current) drug therapy: Secondary | ICD-10-CM | POA: Insufficient documentation

## 2019-12-15 DIAGNOSIS — R42 Dizziness and giddiness: Secondary | ICD-10-CM | POA: Diagnosis not present

## 2019-12-15 DIAGNOSIS — G459 Transient cerebral ischemic attack, unspecified: Secondary | ICD-10-CM | POA: Diagnosis not present

## 2019-12-15 DIAGNOSIS — I10 Essential (primary) hypertension: Secondary | ICD-10-CM | POA: Diagnosis not present

## 2019-12-15 DIAGNOSIS — Z87891 Personal history of nicotine dependence: Secondary | ICD-10-CM | POA: Diagnosis not present

## 2019-12-15 DIAGNOSIS — R52 Pain, unspecified: Secondary | ICD-10-CM | POA: Diagnosis not present

## 2019-12-15 DIAGNOSIS — I2510 Atherosclerotic heart disease of native coronary artery without angina pectoris: Secondary | ICD-10-CM | POA: Diagnosis not present

## 2019-12-15 DIAGNOSIS — R519 Headache, unspecified: Secondary | ICD-10-CM | POA: Diagnosis not present

## 2019-12-15 DIAGNOSIS — G4489 Other headache syndrome: Secondary | ICD-10-CM | POA: Diagnosis not present

## 2019-12-15 DIAGNOSIS — R410 Disorientation, unspecified: Secondary | ICD-10-CM | POA: Diagnosis not present

## 2019-12-15 DIAGNOSIS — R4701 Aphasia: Secondary | ICD-10-CM | POA: Diagnosis not present

## 2019-12-15 LAB — CBC WITH DIFFERENTIAL/PLATELET
Abs Immature Granulocytes: 0.02 10*3/uL (ref 0.00–0.07)
Basophils Absolute: 0.1 10*3/uL (ref 0.0–0.1)
Basophils Relative: 1 %
Eosinophils Absolute: 0.1 10*3/uL (ref 0.0–0.5)
Eosinophils Relative: 1 %
HCT: 42.8 % (ref 39.0–52.0)
Hemoglobin: 14.1 g/dL (ref 13.0–17.0)
Immature Granulocytes: 0 %
Lymphocytes Relative: 22 %
Lymphs Abs: 2.3 10*3/uL (ref 0.7–4.0)
MCH: 29.8 pg (ref 26.0–34.0)
MCHC: 32.9 g/dL (ref 30.0–36.0)
MCV: 90.5 fL (ref 80.0–100.0)
Monocytes Absolute: 0.7 10*3/uL (ref 0.1–1.0)
Monocytes Relative: 7 %
Neutro Abs: 7.5 10*3/uL (ref 1.7–7.7)
Neutrophils Relative %: 69 %
Platelets: 294 10*3/uL (ref 150–400)
RBC: 4.73 MIL/uL (ref 4.22–5.81)
RDW: 12.9 % (ref 11.5–15.5)
WBC: 10.7 10*3/uL — ABNORMAL HIGH (ref 4.0–10.5)
nRBC: 0 % (ref 0.0–0.2)

## 2019-12-15 LAB — COMPREHENSIVE METABOLIC PANEL
ALT: 32 U/L (ref 0–44)
AST: 30 U/L (ref 15–41)
Albumin: 3.9 g/dL (ref 3.5–5.0)
Alkaline Phosphatase: 69 U/L (ref 38–126)
Anion gap: 11 (ref 5–15)
BUN: 10 mg/dL (ref 6–20)
CO2: 28 mmol/L (ref 22–32)
Calcium: 9.8 mg/dL (ref 8.9–10.3)
Chloride: 96 mmol/L — ABNORMAL LOW (ref 98–111)
Creatinine, Ser: 0.85 mg/dL (ref 0.61–1.24)
GFR calc Af Amer: >60 mL/min (ref >60)
GFR calc non Af Amer: >60 mL/min (ref >60)
Glucose, Bld: 112 mg/dL — ABNORMAL HIGH (ref 70–99)
Potassium: 4.8 mmol/L (ref 3.5–5.1)
Sodium: 135 mmol/L (ref 135–145)
Total Bilirubin: 0.8 mg/dL (ref 0.3–1.2)
Total Protein: 6.9 g/dL (ref 6.5–8.1)

## 2019-12-15 LAB — PROTIME-INR
INR: 1 (ref 0.8–1.2)
Prothrombin Time: 12.8 seconds (ref 11.4–15.2)

## 2019-12-15 MED ORDER — IOHEXOL 350 MG/ML SOLN
75.0000 mL | Freq: Once | INTRAVENOUS | Status: AC | PRN
  Administered 2019-12-15: 75 mL via INTRAVENOUS

## 2019-12-15 MED ORDER — ACETAMINOPHEN 325 MG PO TABS
650.0000 mg | ORAL_TABLET | Freq: Once | ORAL | Status: AC
  Administered 2019-12-16: 650 mg via ORAL
  Filled 2019-12-15: qty 2

## 2019-12-15 NOTE — ED Provider Notes (Signed)
[facility] EMERGENCY DEPARTMENT Provider Note   CSN: [id number] Arrival date & time: 12/15/19  1739     History Chief Complaint  Patient presents with  . Altered Mental Status    Sean Flynn is a 54 y.o. male.  HPI Patient is a 54 year old male with medical history notable for double bypass surgery on 4/5 (6 weeks ago) who presents for onset of headache and confusion today.  Patient has recently had a gout flare.  Because of this, he has trouble sleeping.  He takes Ambien, normally every few days.  He has taken it for the past 2 nights.  Last dose was around 11 PM.  Patient was sleeping well this morning.  He woke up around 9 AM.  He felt normal throughout the morning.  Around 1:30 PM, he went out to get a haircut.  At that time, reports bilateral peripheral blurry vision and frontal headache.  After he got home, he took a nap for about 1-1/2 hours.  When he woke up from his nap, he reported "not feeling right".  His wife noticed that he had some element of word finding inability.  Patient does remember this time.  Patient reports that he felt generalized weakness and some confusion.  He denies any focal symptoms.  Upon arrival in the ED, patient's vital signs are normal and he denies any current symptoms. Current blood thinning medication is aspirin only.    Past Medical History:  Diagnosis Date  . Anxiety   . Former smoker   . Hypertension   . Mixed hyperlipidemia     Patient Active Problem List   Diagnosis Date Noted  . Gout 11/23/2019  . 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 Coronary artery disease of bypass graft of native heart with stable angina pectoris (HCC) 11/14/2019  . Family history of early CAD 11/14/2019  . Acute coronary syndrome (HCC) 11/03/2019  . Chronic bilateral low back pain with left-sided sciatica 10/14/2019  . Family history of colon cancer 10/14/2019  . Hyperglycemia 10/14/2019  . Numbness 10/14/2019  . Visual changes 10/14/2019   . Chronic gout of multiple sites 06/15/2019  . Chronic mid back pain 06/15/2019  . Elevated liver function tests 06/15/2019  . Laceration of extensor muscle, fascia and tendon of right thumb at wrist and hand level, initial encounter 04/05/2019  . Severe obesity (BMI 35.0-39.9) with comorbidity (HCC) 01/18/2019  . Atherosclerosis of aorta (HCC) 11/16/2017  . Alcohol use 06/11/2016  . Atypical chest pain 06/11/2016  . Benign essential HTN 06/11/2016  . Insomnia 06/11/2016    Past Surgical History:  Procedure Laterality Date  . CORONARY ARTERY BYPASS GRAFT N/A 11/07/2019   Procedure: CORONARY ARTERY BYPASS GRAFTING (CABG) x 2 WITH BILATERAL INTERNAL MAMMARY ARTERIES. LIMA TO LAD, RIMA TO DISTAL RCA;  Surgeon: Loreli Slot, MD;  Location: [facility] OR;  Service: Open Heart Surgery;  Laterality: N/A;  . LEFT HEART CATH AND CORONARY ANGIOGRAPHY N/A 11/03/2019   Procedure: LEFT HEART CATH AND CORONARY ANGIOGRAPHY;  Surgeon: Elder Negus, MD;  Location: [facility] INVASIVE CV LAB;  Service: Cardiovascular;  Laterality: N/A;  . REPAIR EXTENSOR TENDON Right 04/07/2019   Procedure: REPAIR RIGHT THUMB EXTENSOR TENDON;  Surgeon: Betha Loa, MD;  Location: [facility];  Service: Orthopedics;  Laterality: Right;  . TEE WITHOUT CARDIOVERSION N/A 11/07/2019   Procedure: Transesophageal Echocardiogram (Tee);  Surgeon: Loreli Slot, MD;  Location: [facility] OR;  Service: Open Heart Surgery;  Laterality: N/A;  Family History  Problem Relation Age of Onset  . CAD Father        MI in early 1s  . Cancer Father   . Heart failure Father   . Dementia Mother   . Healthy Brother     Social History   Tobacco Use  . Smoking status: Former Smoker    Packs/day: 1.00    Years: 25.00    Pack years: 25.00    Types: Cigarettes  . Smokeless tobacco: Never Used  Substance Use Topics  . Alcohol use: Yes    Alcohol/week: 6.0 - 10.0 standard drinks    Types: 6 - 10 Cans of beer per week      Comment: Daily  . Drug use: Never    Home Medications Prior to Admission medications   Medication Sig Start Date End Date Taking? Authorizing Provider  aspirin 325 MG tablet Take 1 tablet (325 mg total) by mouth daily. 11/11/19   Gold, Wayne E, PA-C  atorvastatin (LIPITOR) 40 MG tablet Take 1 tablet (40 mg total) by mouth daily at 6 PM. 11/11/19   Gold, Glenice Laine, PA-C  colchicine 0.6 MG tablet Take 0.6 mg by mouth as needed (gout).     Alver Fisher, RN  lisinopril (ZESTRIL) 10 MG tablet Take 1 tablet (10 mg total) by mouth daily. 11/11/19   Rowe Clack, PA-C  Multiple Vitamin (MULTIVITAMIN) tablet Take 1 tablet by mouth at bedtime.     Alver Fisher, RN  omega-3 fish oil (MAXEPA) 1000 MG CAPS capsule Take 2 capsules by mouth at bedtime.     Alver Fisher, RN  pantoprazole (PROTONIX) 40 MG tablet Take 1 tablet (40 mg total) by mouth daily. 11/14/19   [name], Anabel Bene, MD  predniSONE (DELTASONE) 10 MG tablet Take as directed for 12 days.  Daily dose 6,6,5,5,4,4,3,3,2,2,1,1. Patient not taking: Reported on 12/06/2019 11/24/19   Hilts, Casimiro Needle, MD  zolpidem (AMBIEN) 5 MG tablet Take 2.5-5 mg by mouth at bedtime as needed for sleep.     Alver Fisher, RN    Allergies    Oxycodone-acetaminophen  Review of Systems   Review of Systems  Constitutional: Negative for activity change, appetite change, chills, fatigue and fever.  HENT: Negative for ear pain and sore throat.   Eyes: Positive for visual disturbance. Negative for pain.  Respiratory: Negative for cough, chest tightness, shortness of breath and wheezing.   Cardiovascular: Negative for chest pain, palpitations and leg swelling.  Gastrointestinal: Negative for abdominal pain, nausea and vomiting.  Genitourinary: Negative for dysuria and hematuria.  Musculoskeletal: Negative for arthralgias and back pain.  Skin: Negative for color change and rash.  Neurological: Positive for speech difficulty and headaches. Negative for seizures,  syncope, weakness and numbness.  Psychiatric/Behavioral: Positive for confusion.  All other systems reviewed and are negative.   Physical Exam Updated Vital Signs BP 124/87 (BP Location: Right Arm)   Pulse 85   Temp 98.1 F (36.7 C) (Oral)   Resp 19   Ht  (1.753 m)   Wt 102.5 kg   SpO2 98%   BMI 33.37 kg/m   Physical Exam Vitals and nursing note reviewed.  Constitutional:      General: He is not in acute distress.    Appearance: Normal appearance. He is well-developed. He is obese. He is not ill-appearing, toxic-appearing or diaphoretic.  HENT:     Head: Normocephalic and atraumatic.     Right Ear: External ear normal.  Left Ear: External ear normal.     Nose: Nose normal.     Mouth/Throat:     Mouth: Mucous membranes are moist.     Pharynx: Oropharynx is clear.  Eyes:     General: No visual field deficit.    Extraocular Movements: Extraocular movements intact.     Conjunctiva/sclera: Conjunctivae normal.     Pupils: Pupils are equal, round, and reactive to light.  Cardiovascular:     Rate and Rhythm: Normal rate and regular rhythm.     Heart sounds: No murmur.  Pulmonary:     Effort: Pulmonary effort is normal. No respiratory distress.     Breath sounds: Normal breath sounds. No wheezing.  Chest:     Chest wall: No tenderness.  Abdominal:     Palpations: Abdomen is soft.     Tenderness: There is no abdominal tenderness. There is no guarding.  Musculoskeletal:        General: Normal range of motion.     Cervical back: Neck supple. No rigidity or tenderness.  Skin:    General: Skin is warm and dry.     Capillary Refill: Capillary refill takes less than 2 seconds.  Neurological:     Mental Status: He is alert and oriented to person, place, and time.     Cranial Nerves: Cranial nerves are intact. No cranial nerve deficit, dysarthria or facial asymmetry.     Sensory: Sensation is intact. No sensory deficit.     Motor: Motor function is intact. No weakness,  abnormal muscle tone or pronator drift.     Coordination: Romberg sign negative. Finger-Nose-Finger Test and Heel to KeystoneShin Test normal.     Gait: Gait is intact. Gait normal.  Psychiatric:        Mood and Affect: Mood normal.        Behavior: Behavior normal.     ED Results / Procedures / Treatments   Labs (all labs ordered are listed, but only abnormal results are displayed) Labs Reviewed  COMPREHENSIVE METABOLIC PANEL - Abnormal; Notable for the following components:      Result Value   Chloride 96 (*)    Glucose, Bld 112 (*)    All other components within normal limits  CBC WITH DIFFERENTIAL/PLATELET - Abnormal; Notable for the following components:   WBC 10.7 (*)    All other components within normal limits  PROTIME-INR  CBG MONITORING, ED    EKG EKG Interpretation  Date/Time:  Thursday Dec 15 2019 17:49:13 EDT Ventricular Rate:  94 PR Interval:    QRS Duration: 90 QT Interval:  437 QTC Calculation: 547 R Axis:   48 Text Interpretation: Sinus rhythm Borderline T wave abnormalities Prolonged QT interval Confirmed by Rolan BuccoBelfi, Melanie [phone]([id number]) on 12/15/2019 6:01:30 PM   Radiology CT ANGIO HEAD W OR WO CONTRAST  Result Date: 12/15/2019 CLINICAL DATA:  Acute headache with expressive aphasia and dizziness. EXAM: CT ANGIOGRAPHY HEAD AND NECK TECHNIQUE: Multidetector CT imaging of the head and neck was performed using the standard protocol during bolus administration of intravenous contrast. Multiplanar CT image reconstructions and MIPs were obtained to evaluate the vascular anatomy. Carotid stenosis measurements (when applicable) are obtained utilizing NASCET criteria, using the distal internal carotid diameter as the denominator. CONTRAST:  75mL OMNIPAQUE IOHEXOL 350 MG/ML SOLN COMPARISON:  None. FINDINGS: CTA NECK FINDINGS SKELETON: There is no bony spinal canal stenosis. No lytic or blastic lesion. OTHER NECK: Normal pharynx, larynx and major salivary glands. No cervical  lymphadenopathy. Unremarkable thyroid  gland. UPPER CHEST: No pneumothorax or pleural effusion. No nodules or masses. AORTIC ARCH: There is mild calcific atherosclerosis of the aortic arch. There is no aneurysm, dissection or hemodynamically significant stenosis of the visualized portion of the aorta. Conventional 3 vessel aortic branching pattern. The visualized proximal subclavian arteries are widely patent. RIGHT CAROTID SYSTEM: Normal without aneurysm, dissection or stenosis. LEFT CAROTID SYSTEM: Normal without aneurysm, dissection or stenosis. VERTEBRAL ARTERIES: Right dominant configuration. Both origins are clearly patent. There is no dissection, occlusion or flow-limiting stenosis to the skull base (V1-V3 segments). CTA HEAD FINDINGS POSTERIOR CIRCULATION: --Vertebral arteries: Normal V4 segments. --Inferior cerebellar arteries: Normal. --Basilar artery: Normal. --Superior cerebellar arteries: Normal. --Posterior cerebral arteries (PCA): Normal. ANTERIOR CIRCULATION: --Intracranial internal carotid arteries: Atherosclerotic calcification of the internal carotid arteries at the skull base without hemodynamically significant stenosis. --Anterior cerebral arteries (ACA): Normal. Both A1 segments are present. Patent anterior communicating artery (a-comm). --Middle cerebral arteries (MCA): Normal. VENOUS SINUSES: As permitted by contrast timing, patent. ANATOMIC VARIANTS: None Review of the MIP images confirms the above findings. IMPRESSION: No emergent large vessel occlusion or high-grade stenosis of the intracranial arteries or cervical. Aortic atherosclerosis (ICD10-I70.0). Electronically Signed   By: Ulyses Jarred M.D.   On: 12/15/2019 23:31   CT HEAD WO CONTRAST  Result Date: 12/15/2019 CLINICAL DATA:  Severe headache, dizziness, expressive aphasia EXAM: CT HEAD WITHOUT CONTRAST TECHNIQUE: Contiguous axial images were obtained from the base of the skull through the vertex without intravenous contrast.  COMPARISON:  None. FINDINGS: Brain: There are scattered areas of decreased attenuation throughout the periventricular white matter, consistent with age-indeterminate small vessel ischemic change. This most pronounced in the left frontal region. Otherwise no signs of acute infarct or hemorrhage. Lateral ventricles are mildly prominent out of proportion to the degree of cerebral atrophy, which can be seen with normal pressure hydrocephalus. Remaining midline structures are unremarkable. No acute extra-axial fluid collections. No mass effect. Vascular: No hyperdense vessel. Extensive atherosclerosis of the internal carotid arteries. Skull: Normal. Negative for fracture or focal lesion. Sinuses/Orbits: No acute finding. Other: None. IMPRESSION: 1. Age-indeterminate small vessel ischemic changes within the periventricular white matter, most pronounced in the left frontal lobe. 2. No acute hemorrhage. 3. Slight prominence of the lateral ventricles which may reflect normal pressure hydrocephalus. Electronically Signed   By: Randa Ngo M.D.   On: 12/15/2019 20:49   CT ANGIO NECK W OR WO CONTRAST  Result Date: 12/15/2019 CLINICAL DATA:  Acute headache with expressive aphasia and dizziness. EXAM: CT ANGIOGRAPHY HEAD AND NECK TECHNIQUE: Multidetector CT imaging of the head and neck was performed using the standard protocol during bolus administration of intravenous contrast. Multiplanar CT image reconstructions and MIPs were obtained to evaluate the vascular anatomy. Carotid stenosis measurements (when applicable) are obtained utilizing NASCET criteria, using the distal internal carotid diameter as the denominator. CONTRAST:  36mL OMNIPAQUE IOHEXOL 350 MG/ML SOLN COMPARISON:  None. FINDINGS: CTA NECK FINDINGS SKELETON: There is no bony spinal canal stenosis. No lytic or blastic lesion. OTHER NECK: Normal pharynx, larynx and major salivary glands. No cervical lymphadenopathy. Unremarkable thyroid gland. UPPER CHEST: No  pneumothorax or pleural effusion. No nodules or masses. AORTIC ARCH: There is mild calcific atherosclerosis of the aortic arch. There is no aneurysm, dissection or hemodynamically significant stenosis of the visualized portion of the aorta. Conventional 3 vessel aortic branching pattern. The visualized proximal subclavian arteries are widely patent. RIGHT CAROTID SYSTEM: Normal without aneurysm, dissection or stenosis. LEFT CAROTID SYSTEM: Normal without aneurysm, dissection  or stenosis. VERTEBRAL ARTERIES: Right dominant configuration. Both origins are clearly patent. There is no dissection, occlusion or flow-limiting stenosis to the skull base (V1-V3 segments). CTA HEAD FINDINGS POSTERIOR CIRCULATION: --Vertebral arteries: Normal V4 segments. --Inferior cerebellar arteries: Normal. --Basilar artery: Normal. --Superior cerebellar arteries: Normal. --Posterior cerebral arteries (PCA): Normal. ANTERIOR CIRCULATION: --Intracranial internal carotid arteries: Atherosclerotic calcification of the internal carotid arteries at the skull base without hemodynamically significant stenosis. --Anterior cerebral arteries (ACA): Normal. Both A1 segments are present. Patent anterior communicating artery (a-comm). --Middle cerebral arteries (MCA): Normal. VENOUS SINUSES: As permitted by contrast timing, patent. ANATOMIC VARIANTS: None Review of the MIP images confirms the above findings. IMPRESSION: No emergent large vessel occlusion or high-grade stenosis of the intracranial arteries or cervical. Aortic atherosclerosis (ICD10-I70.0). Electronically Signed   By: Deatra Robinson M.D.   On: 12/15/2019 23:31   MR BRAIN WO CONTRAST  Result Date: 12/16/2019 CLINICAL DATA:  Headache and expressive aphasia EXAM: MRI HEAD WITHOUT CONTRAST TECHNIQUE: Multiplanar, multiecho pulse sequences of the brain and surrounding structures were obtained without intravenous contrast. COMPARISON:  CTA head neck 12/15/2019 FINDINGS: BRAIN: No acute  infarct, acute hemorrhage or extra-axial collection. Multifocal white matter hyperintensity, most commonly due to chronic ischemic microangiopathy. Advanced atrophy for age. No chronic microhemorrhage. Normal midline structures. VASCULAR: Major flow voids are preserved. SKULL AND UPPER CERVICAL SPINE: Normal calvarium and skull base. Visualized upper cervical spine and soft tissues are normal. SINUSES/ORBITS: No paranasal sinus fluid levels or advanced mucosal thickening. No mastoid or middle ear effusion. Normal orbits. IMPRESSION: 1. No acute intracranial abnormality. 2. Advanced atrophy and findings of chronic small vessel disease. Electronically Signed   By: Deatra Robinson M.D.   On: 12/16/2019 00:58    Procedures Procedures (including critical care time)  Medications Ordered in ED Medications  iohexol (OMNIPAQUE) 350 MG/ML injection 75 mL (75 mLs Intravenous Contrast Given 12/15/19 2308)  acetaminophen (TYLENOL) tablet 650 mg (650 mg Oral Given 12/16/19 0011)    ED Course  I have reviewed the triage vital signs and the nursing notes.  Pertinent labs & imaging results that were available during my care of the patient were reviewed by me and considered in my medical decision making (see chart for details).    MDM Rules/Calculators/A&P                      Patient is a 54 year old male who presents after symptoms of headache, peripheral blurriness in his vision, and difficulty in word finding ability earlier today.  He felt normal this morning.  Around noon, he had the onset of a frontal headache and endorsed bilateral blurriness in his lateral visual fields.  He went home and took a nap.  After he woke up from his nap, he and his wife noticed that he seemed confused and had difficulty finding his words.  He did not have nonsensical speech.  He did not have any areas of numbness or weakness.  Upon arrival in the ED, his vital signs are normal.  He is well-appearing.  He denies any current  symptoms.  On exam, he has no focal neurologic deficits.  He had bypass surgery 6 weeks ago.  Currently, he denies any symptoms of chest pain or shortness of breath.  His scar is well-healing.  History is concerning for TIA.  Given his history of ACS, he is also at risk for CVA.  Work-up was initiated to include labs and a noncontrasted CT scan of his head.  Labs notable for normal electrolytes, slightly elevated WBC (1.7), normal hemoglobin, normal platelets, and normal INR.  CT of head showed age-indeterminate small vessel ischemic changes, mostly in the left frontal lobe.  There was slight prominence of lateral ventricles.  Neurology was consulted to advised obtaining CTA studies.  CTA of head and neck showed no large vessel occlusion or high-grade stenoses.  Neurology ordered MRI for further evaluation.  While in the ED, patient did not have any further symptoms.  Upon reassessment, he did endorse continued gout pain in his right knee.  Tylenol was ordered.  At time of signout, results of MR study were pending.  Care of patient was signed out to oncoming ED team.  Final Clinical Impression(s) / ED Diagnoses Final diagnoses:  TIA (transient ischemic attack)    Rx / DC Orders ED Discharge Orders    None       Gloris Manchester, MD 12/16/19 0923    Rolan Bucco, MD 12/19/19 1757

## 2019-12-15 NOTE — ED Triage Notes (Signed)
Pt took a nap at about 1500 this afternoon. Upon waking pt had severe h/a, dizziness, and expressive aphasia. Pt still reports h/a on arrival but not as bad 3/10. A/O x4. VS within normal limits. Had dbl bypass 11/07/19

## 2019-12-16 ENCOUNTER — Emergency Department (HOSPITAL_COMMUNITY): Payer: BC Managed Care – PPO

## 2019-12-16 DIAGNOSIS — R410 Disorientation, unspecified: Secondary | ICD-10-CM

## 2019-12-16 NOTE — ED Notes (Signed)
Pt to MRI at this time.

## 2019-12-16 NOTE — Discharge Instructions (Addendum)
Please schedule follow-up with one of the listed neurology groups to be seen in the office for further evaluation.  If you have any recurrence of your symptoms, return to the ER.  Continue your current medications as prescribed.

## 2019-12-16 NOTE — Consult Note (Signed)
Requesting Physician: Dr. Blinda Leatherwood    Chief Complaint: Difficulty with language, slowness in thinking  History obtained from: Patient and Chart   HPI:                                                                                                                                       Sean Flynn is a 54 y.o. male with past medical history of anxiety, hypertension, hyperlipidemia, prior tobacco abuse, coronary artery disease with recent CABG presents to the emergency department with blurred vision, slowness in thinking.  Patient states he had difficulty thinking of the words however denies actually having errors in speech.  Denies slurred speech, facial droop, headache but does complain of bilateral blurring of vision.     Past Medical History:  Diagnosis Date   Anxiety    Former smoker    Hypertension    Mixed hyperlipidemia     Past Surgical History:  Procedure Laterality Date   CORONARY ARTERY BYPASS GRAFT N/A 11/07/2019   Procedure: CORONARY ARTERY BYPASS GRAFTING (CABG) x 2 WITH BILATERAL INTERNAL MAMMARY ARTERIES. LIMA TO LAD, RIMA TO DISTAL RCA;  Surgeon: Loreli Slot, MD;  Location: [facility] OR;  Service: Open Heart Surgery;  Laterality: N/A;   LEFT HEART CATH AND CORONARY ANGIOGRAPHY N/A 11/03/2019   Procedure: LEFT HEART CATH AND CORONARY ANGIOGRAPHY;  Surgeon: Elder Negus, MD;  Location: [facility];  Service: Cardiovascular;  Laterality: N/A;   REPAIR EXTENSOR TENDON Right 04/07/2019   Procedure: REPAIR RIGHT THUMB EXTENSOR TENDON;  Surgeon: Betha Loa, MD;  Location: [facility];  Service: Orthopedics;  Laterality: Right;   TEE WITHOUT CARDIOVERSION N/A 11/07/2019   Procedure: Transesophageal Echocardiogram (Tee);  Surgeon: Loreli Slot, MD;  Location: [facility] OR;  Service: Open Heart Surgery;  Laterality: N/A;    Family History  Problem Relation Age of Onset   CAD Father        MI in early 67s   Cancer Father    Heart failure Father     Dementia Mother    Healthy Brother    Social History:  reports that he has quit smoking. His smoking use included cigarettes. He has a 25.00 pack-year smoking history. He has never used smokeless tobacco. He reports current alcohol use of about 6.0 - 10.0 standard drinks of alcohol per week. He reports that he does not use drugs.  Allergies:  Allergies  Allergen Reactions   Oxycodone-Acetaminophen     hallucinations     Medications:  I reviewed home medications   ROS:                                                                                                                                     14 systems reviewed and negative except above    Examination:                                                                                                      General: Appears well-developed  Psych: Affect appropriate to situation Eyes: No scleral injection HENT: No OP obstrucion Head: Normocephalic.  Cardiovascular: Normal rate and regular rhythm.  Respiratory: Effort normal and breath sounds normal to anterior ascultation GI: Soft.  No distension. There is no tenderness.  Skin: WDI    Neurological Examination Mental Status: Alert, oriented, thought content appropriate.  Speech fluent without evidence of aphasia. Able to follow 3 step commands without difficulty. Cranial Nerves: II: Visual fields grossly normal,  III,IV, VI: ptosis not present, extra-ocular motions intact bilaterally, pupils equal, round, reactive to light and accommodation V,VII: smile symmetric, facial light touch sensation normal bilaterally VIII: hearing normal bilaterally IX,X: uvula rises symmetrically XI: bilateral shoulder shrug XII: midline tongue extension Motor: Right : Upper extremity   5/5    Left:     Upper extremity   5/5  Lower extremity   5/5     Lower extremity    5/5 Tone and bulk:normal tone throughout; no atrophy noted Sensory: Pinprick and light touch intact throughout, bilaterally Deep Tendon Reflexes: 2+ and symmetric throughout Plantars: Right: downgoing   Left: downgoing Cerebellar: normal finger-to-nose, normal rapid alternating movements and normal heel-to-shin test Gait: normal gait and station     Lab Results: Basic Metabolic Panel: Recent Labs  Lab 12/15/19 1850  NA 135  K 4.8  CL 96*  CO2 28  GLUCOSE 112*  BUN 10  CREATININE 0.85  CALCIUM 9.8    CBC: Recent Labs  Lab 12/15/19 1850  WBC 10.7*  NEUTROABS 7.5  HGB 14.1  HCT 42.8  MCV 90.5  PLT 294    Coagulation Studies: Recent Labs    12/15/19 1850  LABPROT 12.8  INR 1.0    Imaging: CT ANGIO HEAD W OR WO CONTRAST  Result Date: 12/15/2019 CLINICAL DATA:  Acute headache with expressive aphasia and dizziness. EXAM: CT ANGIOGRAPHY HEAD AND NECK TECHNIQUE: Multidetector CT imaging of the head and neck was performed using the standard protocol during bolus administration of intravenous contrast. Multiplanar CT image  reconstructions and MIPs were obtained to evaluate the vascular anatomy. Carotid stenosis measurements (when applicable) are obtained utilizing NASCET criteria, using the distal internal carotid diameter as the denominator. CONTRAST:  17mL OMNIPAQUE IOHEXOL 350 MG/ML SOLN COMPARISON:  None. FINDINGS: CTA NECK FINDINGS SKELETON: There is no bony spinal canal stenosis. No lytic or blastic lesion. OTHER NECK: Normal pharynx, larynx and major salivary glands. No cervical lymphadenopathy. Unremarkable thyroid gland. UPPER CHEST: No pneumothorax or pleural effusion. No nodules or masses. AORTIC ARCH: There is mild calcific atherosclerosis of the aortic arch. There is no aneurysm, dissection or hemodynamically significant stenosis of the visualized portion of the aorta. Conventional 3 vessel aortic branching pattern. The visualized proximal subclavian arteries are  widely patent. RIGHT CAROTID SYSTEM: Normal without aneurysm, dissection or stenosis. LEFT CAROTID SYSTEM: Normal without aneurysm, dissection or stenosis. VERTEBRAL ARTERIES: Right dominant configuration. Both origins are clearly patent. There is no dissection, occlusion or flow-limiting stenosis to the skull base (V1-V3 segments). CTA HEAD FINDINGS POSTERIOR CIRCULATION: --Vertebral arteries: Normal V4 segments. --Inferior cerebellar arteries: Normal. --Basilar artery: Normal. --Superior cerebellar arteries: Normal. --Posterior cerebral arteries (PCA): Normal. ANTERIOR CIRCULATION: --Intracranial internal carotid arteries: Atherosclerotic calcification of the internal carotid arteries at the skull base without hemodynamically significant stenosis. --Anterior cerebral arteries (ACA): Normal. Both A1 segments are present. Patent anterior communicating artery (a-comm). --Middle cerebral arteries (MCA): Normal. VENOUS SINUSES: As permitted by contrast timing, patent. ANATOMIC VARIANTS: None Review of the MIP images confirms the above findings. IMPRESSION: No emergent large vessel occlusion or high-grade stenosis of the intracranial arteries or cervical. Aortic atherosclerosis (ICD10-I70.0). Electronically Signed   By: Ulyses Jarred M.D.   On: 12/15/2019 23:31   CT HEAD WO CONTRAST  Result Date: 12/15/2019 CLINICAL DATA:  Severe headache, dizziness, expressive aphasia EXAM: CT HEAD WITHOUT CONTRAST TECHNIQUE: Contiguous axial images were obtained from the base of the skull through the vertex without intravenous contrast. COMPARISON:  None. FINDINGS: Brain: There are scattered areas of decreased attenuation throughout the periventricular white matter, consistent with age-indeterminate small vessel ischemic change. This most pronounced in the left frontal region. Otherwise no signs of acute infarct or hemorrhage. Lateral ventricles are mildly prominent out of proportion to the degree of cerebral atrophy, which can be  seen with normal pressure hydrocephalus. Remaining midline structures are unremarkable. No acute extra-axial fluid collections. No mass effect. Vascular: No hyperdense vessel. Extensive atherosclerosis of the internal carotid arteries. Skull: Normal. Negative for fracture or focal lesion. Sinuses/Orbits: No acute finding. Other: None. IMPRESSION: 1. Age-indeterminate small vessel ischemic changes within the periventricular white matter, most pronounced in the left frontal lobe. 2. No acute hemorrhage. 3. Slight prominence of the lateral ventricles which may reflect normal pressure hydrocephalus. Electronically Signed   By: Randa Ngo M.D.   On: 12/15/2019 20:49   CT ANGIO NECK W OR WO CONTRAST  Result Date: 12/15/2019 CLINICAL DATA:  Acute headache with expressive aphasia and dizziness. EXAM: CT ANGIOGRAPHY HEAD AND NECK TECHNIQUE: Multidetector CT imaging of the head and neck was performed using the standard protocol during bolus administration of intravenous contrast. Multiplanar CT image reconstructions and MIPs were obtained to evaluate the vascular anatomy. Carotid stenosis measurements (when applicable) are obtained utilizing NASCET criteria, using the distal internal carotid diameter as the denominator. CONTRAST:  71mL OMNIPAQUE IOHEXOL 350 MG/ML SOLN COMPARISON:  None. FINDINGS: CTA NECK FINDINGS SKELETON: There is no bony spinal canal stenosis. No lytic or blastic lesion. OTHER NECK: Normal pharynx, larynx and major salivary glands. No cervical lymphadenopathy.  Unremarkable thyroid gland. UPPER CHEST: No pneumothorax or pleural effusion. No nodules or masses. AORTIC ARCH: There is mild calcific atherosclerosis of the aortic arch. There is no aneurysm, dissection or hemodynamically significant stenosis of the visualized portion of the aorta. Conventional 3 vessel aortic branching pattern. The visualized proximal subclavian arteries are widely patent. RIGHT CAROTID SYSTEM: Normal without aneurysm,  dissection or stenosis. LEFT CAROTID SYSTEM: Normal without aneurysm, dissection or stenosis. VERTEBRAL ARTERIES: Right dominant configuration. Both origins are clearly patent. There is no dissection, occlusion or flow-limiting stenosis to the skull base (V1-V3 segments). CTA HEAD FINDINGS POSTERIOR CIRCULATION: --Vertebral arteries: Normal V4 segments. --Inferior cerebellar arteries: Normal. --Basilar artery: Normal. --Superior cerebellar arteries: Normal. --Posterior cerebral arteries (PCA): Normal. ANTERIOR CIRCULATION: --Intracranial internal carotid arteries: Atherosclerotic calcification of the internal carotid arteries at the skull base without hemodynamically significant stenosis. --Anterior cerebral arteries (ACA): Normal. Both A1 segments are present. Patent anterior communicating artery (a-comm). --Middle cerebral arteries (MCA): Normal. VENOUS SINUSES: As permitted by contrast timing, patent. ANATOMIC VARIANTS: None Review of the MIP images confirms the above findings. IMPRESSION: No emergent large vessel occlusion or high-grade stenosis of the intracranial arteries or cervical. Aortic atherosclerosis (ICD10-I70.0). Electronically Signed   By: Deatra Robinson M.D.   On: 12/15/2019 23:31     ASSESSMENT AND PLAN  54 y.o. male with past medical history of anxiety, hypertension, hyperlipidemia, prior tobacco abuse, coronary artery disease with recent CABG presents to the emergency department with blurred vision, slowness in thinking.  Description of events not typical for TIA, not usually associated bilateral blurry vision.  Vascular imaging was negative for any significant atherosclerotic disease.  MRI brain was negative for acute infarct.   Unlikely to be TIA, more likely migraine vs medicine side effect   Recommendations MRI brain to rule out acute stroke and CT head and neck to rule out large vessel stenosis Continue dual antiplatelet therapy Outpatient neurology follow-up in 3 to 4  weeks   Jenean Escandon [facility] Pager Number [phone]

## 2019-12-16 NOTE — ED Provider Notes (Signed)
Patient signed out to me while awaiting MRI.  Patient had an episode of visual and speech disturbance earlier today.  Neurology recommended the performed work-up including MRI.  This has been reviewed and is negative.  Dr. Laurence Slate has seen the patient and does not feel he requires hospitalization.  He is already on antiplatelet therapy, statin and therefore can follow-up in 3 to 4 weeks with outpatient neurology.   Gilda Crease, MD 12/16/19 [phone]

## 2019-12-19 ENCOUNTER — Other Ambulatory Visit: Payer: Self-pay | Admitting: Cardiology

## 2019-12-19 DIAGNOSIS — I25708 Atherosclerosis of coronary artery bypass graft(s), unspecified, with other forms of angina pectoris: Secondary | ICD-10-CM

## 2019-12-19 NOTE — Telephone Encounter (Signed)
I will order this. Thank you.

## 2019-12-20 ENCOUNTER — Telehealth (HOSPITAL_COMMUNITY): Payer: Self-pay | Admitting: *Deleted

## 2019-12-20 NOTE — Telephone Encounter (Signed)
-----   Message from [facility], MD sent at 12/19/2019  5:24 PM EDT ----- Regarding: RE: Cardiac Rehab Did not realize all of the below had happened. He sent a message requesting a cardiac rehab referral. I will discuss more with him on 5/21 appt and get back to you. Sorry for the confusion.  Thanks MJP  ----- Message ----- From: Chelsea Aus, RN Sent: 12/19/2019   3:05 PM EDT To: Elder Negus, MD Subject: Cardiac Rehab                                  Dr. Rosemary Holms,  Got a second referral for this pt to participate in CR.  I have been following along completed 1 follow up with you and has been seen by CVTS and planned to review his appt with on on 5/21 for readiness to proceed with group exercise.  Looks like pt had brief ER visit 5/13-5/14  with blurred vision and slow thought process.  Pt seen by neurology.  Felt it may be migraine vs medicine side effect.  Pt to follow up as outpatient in 3-4 weeks ( not scheduled yet).  Pt has previous scheduled appt with you on 5/21.  Does Mr. Khachatryan need to hold on scheduling until seen in follow up by you and neurology? Note we are scheduling for the second week of June.  Thanks for the advisement Alanson Aly, BSN Cardiac and Pulmonary Rehab Nurse Navigator

## 2019-12-20 NOTE — Telephone Encounter (Signed)
Received second referral for this pt to participate in cardiac rehab from Dr. Rosemary Holms.  Noted that pt had brief ER visit for altered mental status.  Pt seen in the ER by neurology. Pt discharged from ER.  Pt to follow up with neurology in 3-4 weeks.  Not scheduled as of yet.  Pt has previous scheduled appt with [name] on 5/21.  Sent in basket message to [name] seeking to clarify if pt should complete his follow up first or is ok to proceed with scheduling cardiac rehab.  Received response back that pt should be seen and evaluated for readiness to proceed with scheduling Cardiac rehab.  Called and left message for pt requesting call back.  Contact information provided. Alanson Aly, BSN Cardiac and Emergency planning/management officer

## 2019-12-21 DIAGNOSIS — M25471 Effusion, right ankle: Secondary | ICD-10-CM | POA: Diagnosis not present

## 2019-12-21 DIAGNOSIS — I2510 Atherosclerotic heart disease of native coronary artery without angina pectoris: Secondary | ICD-10-CM | POA: Diagnosis not present

## 2019-12-21 DIAGNOSIS — M109 Gout, unspecified: Secondary | ICD-10-CM | POA: Diagnosis not present

## 2019-12-21 NOTE — Progress Notes (Signed)
Follow up visit  Subjective:   Sean Flynn, male    DOB: Apr 29, 1966, 54 y.o.   MRN: [id number]   HPI  Chief Complaint  Patient presents with  . Coronary Artery Disease  . Hospitalization Follow-up     54 y.o. Caucasian male with hypertension, hyperlipidemia, former smoker, h/o EtOH overuse, h/o gout,ACS with transient SAT elevation-->s/p CABGX2 (LIMA-LAD, RIMA-RCA) in 10/2019, PAD, gout, migraine.  Patient was seen in [facility] emergency department on 12/16/2019 with complaints of blurred vision and slowness in thinking.  He was evaluated by neurology.  TIA was felt to be unlikely.  Migraine or medication side effect was felt to be more likely.  MRI brain showed no acute intracranial abnormality, but showed advanced atrophy and findings of chronic small vessel disease.  He has had few more episodes of vision changes since then. He has upcoming Neurology appt.  Gout has improved after treatment with allopurinol and colchicine. He is seeing rheumatology. He has calf claudication, but is not lifestyle limiting.   Current Outpatient Medications on File Prior to Visit  Medication Sig Dispense Refill  . allopurinol (ZYLOPRIM) 100 MG tablet Take 100 mg by mouth daily.    Marland Kitchen aspirin 325 MG tablet Take 1 tablet (325 mg total) by mouth daily.    Marland Kitchen atorvastatin (LIPITOR) 40 MG tablet Take 1 tablet (40 mg total) by mouth daily at 6 PM. 30 tablet 1  . colchicine 0.6 MG tablet Take 0.6 mg by mouth 2 (two) times daily.     Marland Kitchen lisinopril (ZESTRIL) 10 MG tablet Take 1 tablet (10 mg total) by mouth daily. 30 tablet 1  . Multiple Vitamin (MULTIVITAMIN) tablet Take 1 tablet by mouth at bedtime.     Marland Kitchen omega-3 fish oil (MAXEPA) 1000 MG CAPS capsule Take 2 capsules by mouth at bedtime.     . pantoprazole (PROTONIX) 40 MG tablet Take 1 tablet (40 mg total) by mouth daily. 60 tablet 2  . predniSONE (DELTASONE) 5 MG tablet Take 5 mg by mouth daily with breakfast.    . zolpidem (AMBIEN) 5 MG tablet Take  2.5-5 mg by mouth at bedtime as needed for sleep.      No current facility-administered medications on file prior to visit.    Cardiovascular & other pertient studies:  EKG 12/23/2019:  Sinus rhythm 69 bpm  Nonspecific T-abnormality inferior leads  MRI brain 12/16/2019: 1. No acute intracranial abnormality. 2. Advanced atrophy and findings of chronic small vessel disease.  Op note 11/07/2019 (Dr. Roxan Hockey): 1.  Median sternotomy.  2.  Extracorporeal circulation 3.  Coronary artery bypass grafting x 2      Left internal mammary artery to left anterior descending,      Free right internal mammary artery to distal right coronary.  LEA duplex 11/04/2019: Patent distal abdominal aorta and bilateral iliac arteries without  evidence of significant stenosis.  Right: 75-99% stenosis noted in the superficial femoral artery. Posterior  tibial artery appears occluded.  Left: 75-99% stenosis noted in the superficial femoral artery. Posterior  tibial artery appears occluded.   Echocardiogram 11/04/2019: 1. Mild LVH. Normal LV size. Mild global hypokinesis. LVEF 45-50%. Normal  diastolic function.  2. Normal RV size and function.  3. No significant valvular abnormality.  4. Normal right atrial pressure.   Coronary angiogram 11/03/2019: LM: Normal LAD: Severe ostial to mid calcification with long 50-75% calcified lesion upto mid LAD. Distal vessel is spared of calcification, with excellent distal surgical target.  LCx: Mid calcific  50% stenosis. RCA: Severely calcified prox CTO with a narrow antegrade collateral, adequate to maintain TIMI III flow at rest, follwowed by log mid 60-80% long calcified lesion. Distal vessel is spared of calcification, with excellent distal surgical target.   LVEDP 20 mmHg   Recent labs:  12/15/2019: Glucose 112, BUN/Cr 10/0.85. EGFR 60. Na/K 135/4.8. Chloride 96. Rest of the CMP normal H/H 14/42. Platelets 294 Chol 144, TG 317, HDL 54, LDL  27  11/10/2019: Glucose 122, BUN/Cr 17/1.09. EGFR >60. Na/K 136/3.9. Rest of the CMP normal H/H 10/30. MCV 95. Platelets 242 HbA1C 5.5% Chol 144, TG 317, HDL 54, LDL 27 Lipoprotein (a) 213, elevated   Review of Systems  Cardiovascular: Positive for claudication. Negative for chest pain, dyspnea on exertion, leg swelling, palpitations and syncope.       Vitals:   12/23/19 1133  BP: 113/75  Pulse: 81  SpO2: 98%     Body mass index is 33.08 kg/m. Filed Weights   12/23/19 1133  Weight: 224 lb (101.6 kg)     Objective:   Physical Exam  Constitutional: He appears well-developed and well-nourished.  Neck: No JVD present.  Cardiovascular: Normal rate, regular rhythm and normal heart sounds.  No murmur heard. Pulses:      Femoral pulses are 2+ on the right side and 2+ on the left side.      Popliteal pulses are 0 on the right side and 1+ on the left side.       Dorsalis pedis pulses are 0 on the right side and 0 on the left side.       Posterior tibial pulses are 0 on the right side and 0 on the left side.  Pulmonary/Chest: Effort normal and breath sounds normal. He has no wheezes. He has no rales.  Sternotomy scar, healing well Minimal oozing, no discharge. No tenderness, erythema.  Musculoskeletal:        General: No edema.  Nursing note and vitals reviewed.       Assessment & Recommendations:   54 y.o. Caucasian male with hypertension, hyperlipidemia, former smoker, h/o EtOH overuse, h/o gout,ACS with transient SAT elevation-->s/p CABGX2 (LIMA-LAD, RIMA-RCA) in 10/2019, PAD, gout, migraine.  CAD: S/p CABGX2 (LIMA-LAD, RIMA-RCA). Continue lisinopril 10 mg daily, lipitor 80 mg daily.  Not ob beta blocker due to relative hypotension, lightheadedness. Recommend plavix 75 mg for PAD.  Okay to start cardiac rehab  PAD:  Non-lifestyle limiting b/l calf claudication. Suspect b/l SFA stenoses. Continue medical management. Switch Aspirin 325 mg to Plavix 75 mg daily.  Continue statin. Encourage regular walking.   Elevated lipoprotein (a): Conitnue statin for now. Will be eligible enroll in HORIZON trial in July  F/u in 03/2020   Nigel Mormon, MD [facility] Pager: [phone] Office: [phone]

## 2019-12-22 ENCOUNTER — Telehealth (HOSPITAL_COMMUNITY): Payer: Self-pay | Admitting: *Deleted

## 2019-12-22 ENCOUNTER — Other Ambulatory Visit: Payer: Self-pay

## 2019-12-22 NOTE — Telephone Encounter (Signed)
Pt left voicemail on 5/19 l returning my call from 5/18.  Returned pt call and left message for him.  Advising that he would need to complete his follow up on tomorrow with Dr. Rosemary Holms.  Also asked for information regarding following up with neurology.  Requested call back. Alanson Aly, BSN Cardiac and Emergency planning/management officer

## 2019-12-22 NOTE — Telephone Encounter (Signed)
Pt returned call.  Reviewed that pt would need to be seen by Dr. Rosemary Holms on tomorrow.    Asked pt about neurology follow up. Pt was given a couple of names to contact for scheduling.  [name] wanted to check with [name] to see who he recommended.  Will follow up with pt after completion of his appt.  Pt verbalized understanding and thanked me for this call. Alanson Aly, BSN Cardiac and Emergency planning/management officer

## 2019-12-23 ENCOUNTER — Other Ambulatory Visit: Payer: Self-pay

## 2019-12-23 ENCOUNTER — Ambulatory Visit: Payer: BC Managed Care – PPO | Admitting: Cardiology

## 2019-12-23 ENCOUNTER — Encounter: Payer: Self-pay | Admitting: Cardiology

## 2019-12-23 VITALS — BP 113/75 | HR 81 | Ht 69.0 in | Wt 224.0 lb

## 2019-12-23 DIAGNOSIS — E7841 Elevated Lipoprotein(a): Secondary | ICD-10-CM

## 2019-12-23 DIAGNOSIS — I25708 Atherosclerosis of coronary artery bypass graft(s), unspecified, with other forms of angina pectoris: Secondary | ICD-10-CM | POA: Diagnosis not present

## 2019-12-23 DIAGNOSIS — I739 Peripheral vascular disease, unspecified: Secondary | ICD-10-CM

## 2019-12-23 MED ORDER — CLOPIDOGREL BISULFATE 75 MG PO TABS: 75.0000 mg | ORAL_TABLET | Freq: Every day | ORAL | 3 refills | Status: DC

## 2019-12-24 ENCOUNTER — Encounter: Payer: Self-pay | Admitting: Cardiology

## 2019-12-24 DIAGNOSIS — I739 Peripheral vascular disease, unspecified: Secondary | ICD-10-CM | POA: Insufficient documentation

## 2019-12-26 NOTE — Telephone Encounter (Signed)
-----   Message from Elder Negus, MD sent at 12/24/2019 11:48 AM EDT ----- Regarding: RE: Cardiac Rehab Ok to start cardiac rehab.  Thanks MJP  ----- Message ----- From: Chelsea Aus, RN Sent: 12/20/2019   8:46 AM EDT To: Elder Negus, MD Subject: RE: Cardiac Rehab                              No worries at all. Will continue to follow Shahara Hartsfield ----- Message ----- From: Elder Negus, MD Sent: 12/19/2019   5:24 PM EDT To: Chelsea Aus, RN Subject: RE: Cardiac Rehab                              Did not realize all of the below had happened. He sent a message requesting a cardiac rehab referral. I will discuss more with him on 5/21 appt and get back to you. Sorry for the confusion.  Thanks MJP  ----- Message ----- From: Chelsea Aus, RN Sent: 12/19/2019   3:05 PM EDT To: Elder Negus, MD Subject: Cardiac Rehab                                  Dr. Rosemary Holms,  Got a second referral for this pt to participate in CR.  I have been following along completed 1 follow up with you and has been seen by CVTS and planned to review his appt with on on 5/21 for readiness to proceed with group exercise.  Looks like pt had brief ER visit 5/13-5/14  with blurred vision and slow thought process.  Pt seen by neurology.  Felt it may be migraine vs medicine side effect.  Pt to follow up as outpatient in 3-4 weeks ( not scheduled yet).  Pt has previous scheduled appt with you on 5/21.  Does Mr. Plasencia need to hold on scheduling until seen in follow up by you and neurology? Note we are scheduling for the second week of June.  Thanks for the advisement Alanson Aly, BSN Cardiac and Pulmonary Rehab Nurse Navigator

## 2019-12-28 ENCOUNTER — Telehealth (HOSPITAL_COMMUNITY): Payer: Self-pay

## 2019-12-28 NOTE — Telephone Encounter (Signed)
Called patient to see if he was interested in participating in the Cardiac Rehab Program. Patient stated yes. Patient will come in for orientation on 01/31/20 @ 930AM and will attend the 845AM exercise class.  Mailed letter

## 2020-01-03 ENCOUNTER — Ambulatory Visit: Payer: BC Managed Care – PPO | Admitting: Diagnostic Neuroimaging

## 2020-01-03 ENCOUNTER — Other Ambulatory Visit: Payer: Self-pay

## 2020-01-03 ENCOUNTER — Encounter: Payer: Self-pay | Admitting: Diagnostic Neuroimaging

## 2020-01-03 VITALS — BP 117/68 | HR 78 | Ht 69.0 in | Wt 222.4 lb

## 2020-01-03 DIAGNOSIS — R4701 Aphasia: Secondary | ICD-10-CM

## 2020-01-03 DIAGNOSIS — G459 Transient cerebral ischemic attack, unspecified: Secondary | ICD-10-CM | POA: Diagnosis not present

## 2020-01-03 NOTE — Progress Notes (Signed)
[facility]  PATIENT: Sean Flynn DOB: 07/22/1966  REFERRING CLINICIAN: Vicenta Aly, FNP HISTORY FROM: patient  REASON FOR VISIT: new consult    HISTORICAL  CHIEF COMPLAINT:  Chief Complaint  Patient presents with  . Transient Ischemic Attack    rm 7 hospital FU, "I feel great, have lost 30 lbs, am exercising"     HISTORY OF PRESENT ILLNESS:   54 year old male with hypertension, hypercholesterolemia, coronary artery disease, here for evaluation of TIA.  12/15/2019 patient woke up from a nap and had trouble getting his words out.  Symptoms lasted about 10 minutes.  Patient's wife called 55 and patient went to the hospital for evaluation.  He was evaluated for possible TIA.  Symptoms had resolved fully by the time arriving in the hospital.  No numbness, tingling.  Had some mild blurred vision.  Had some mild headache.  No nausea or vomiting or sensitive to light or sound.  No history of migraines.  CTA of the head and neck, MRI of the brain were negative.  Since that time patient has followed up with cardiologist and aspirin has been changed to Plavix.   REVIEW OF SYSTEMS: Full 14 system review of systems performed and negative with exception of: As per HPI.  ALLERGIES: Allergies  Allergen Reactions  . Oxycodone-Acetaminophen     hallucinations     HOME MEDICATIONS: Outpatient Medications Prior to Visit  Medication Sig Dispense Refill  . allopurinol (ZYLOPRIM) 100 MG tablet Take 100 mg by mouth daily.    Marland Kitchen atorvastatin (LIPITOR) 40 MG tablet Take 1 tablet (40 mg total) by mouth daily at 6 PM. 30 tablet 1  . clopidogrel (PLAVIX) 75 MG tablet Take 1 tablet (75 mg total) by mouth daily. 30 tablet 3  . colchicine 0.6 MG tablet Take 0.6 mg by mouth 2 (two) times daily.     Marland Kitchen lisinopril (ZESTRIL) 10 MG tablet Take 1 tablet (10 mg total) by mouth daily. 30 tablet 1  . Multiple Vitamin (MULTIVITAMIN) tablet Take 1 tablet by mouth at bedtime.     Marland Kitchen  omega-3 fish oil (MAXEPA) 1000 MG CAPS capsule Take 2 capsules by mouth at bedtime.     . pantoprazole (PROTONIX) 40 MG tablet Take 1 tablet (40 mg total) by mouth daily. 60 tablet 2  . predniSONE (DELTASONE) 5 MG tablet Take 5 mg by mouth daily with breakfast.    . zolpidem (AMBIEN) 5 MG tablet Take 2.5-5 mg by mouth at bedtime as needed for sleep.      No facility-administered medications prior to visit.    PAST MEDICAL HISTORY: Past Medical History:  Diagnosis Date  . Anxiety   . Former smoker   . Hypertension   . Mixed hyperlipidemia   . TIA (transient ischemic attack)     PAST SURGICAL HISTORY: Past Surgical History:  Procedure Laterality Date  . CORONARY ARTERY BYPASS GRAFT N/A 11/07/2019   Procedure: CORONARY ARTERY BYPASS GRAFTING (CABG) x 2 WITH BILATERAL INTERNAL MAMMARY ARTERIES. LIMA TO LAD, RIMA TO DISTAL RCA;  Surgeon: Melrose Nakayama, MD;  Location: [facility];  Service: Open Heart Surgery;  Laterality: N/A;  . LEFT HEART CATH AND CORONARY ANGIOGRAPHY N/A 11/03/2019   Procedure: LEFT HEART CATH AND CORONARY ANGIOGRAPHY;  Surgeon: Nigel Mormon, MD;  Location: [facility];  Service: Cardiovascular;  Laterality: N/A;  . REPAIR EXTENSOR TENDON Right 04/07/2019   Procedure: REPAIR RIGHT THUMB EXTENSOR TENDON;  Surgeon: [name], MD;  Location: [facility];  Service: Orthopedics;  Laterality: Right;  . TEE WITHOUT CARDIOVERSION N/A 11/07/2019   Procedure: Transesophageal Echocardiogram (Tee);  Surgeon: Loreli Slot, MD;  Location: [facility];  Service: Open Heart Surgery;  Laterality: N/A;    FAMILY HISTORY: Family History  Problem Relation Age of Onset  . CAD Father        MI in early 11s  . Cancer Father   . Heart failure Father   . Dementia Mother   . Healthy Brother     SOCIAL HISTORY: Social History   Socioeconomic History  . Marital status: Married    Spouse name: Barth Kirks  . Number of children: 3  . Years of education: Not on  file  . Highest education level: Not on file  Occupational History  . Not on file  Tobacco Use  . Smoking status: Former Smoker    Packs/day: 1.00    Years: 25.00    Pack years: 25.00    Types: Cigarettes    Quit date: 2010    Years since quitting: 11.4  . Smokeless tobacco: Never Used  Substance and Sexual Activity  . Alcohol use: Not Currently    Alcohol/week: 6.0 - 10.0 standard drinks    Types: 6 - 10 Cans of beer per week    Comment: Daily, 01/03/20 none  . Drug use: Never  . Sexual activity: Not on file  Other Topics Concern  . Not on file  Social History Narrative   Lives with wife   Social Determinants of Health   Financial Resource Strain:   . Difficulty of Paying Living Expenses:   Food Insecurity:   . Worried About Programme researcher, broadcasting/film/video in the Last Year:   . Barista in the Last Year:   Transportation Needs:   . Freight forwarder (Medical):   Marland Kitchen Lack of Transportation (Non-Medical):   Physical Activity:   . Days of Exercise per Week:   . Minutes of Exercise per Session:   Stress:   . Feeling of Stress :   Social Connections:   . Frequency of Communication with Friends and Family:   . Frequency of Social Gatherings with Friends and Family:   . Attends Religious Services:   . Active Member of Clubs or Organizations:   . Attends Banker Meetings:   Marland Kitchen Marital Status:   Intimate Partner Violence:   . Fear of Current or Ex-Partner:   . Emotionally Abused:   Marland Kitchen Physically Abused:   . Sexually Abused:      PHYSICAL EXAM  GENERAL EXAM/CONSTITUTIONAL: Vitals:  Vitals:   01/03/20 1115  BP: 117/68  Pulse: 78  Weight: 222 lb 6.4 oz (100.9 kg)  Height: 5\' 9"  (1.753 m)     Body mass index is 32.84 kg/m. Wt Readings from Last 3 Encounters:  01/03/20 222 lb 6.4 oz (100.9 kg)  12/23/19 224 lb (101.6 kg)  12/15/19 226 lb (102.5 kg)     Patient is in no distress; well developed, nourished and groomed; neck is  supple  CARDIOVASCULAR:  Examination of carotid arteries is normal; no carotid bruits  Regular rate and rhythm, no murmurs  Examination of peripheral vascular system by observation and palpation is normal  EYES:  Ophthalmoscopic exam of optic discs and posterior segments is normal; no papilledema or hemorrhages  No exam data present  MUSCULOSKELETAL:  Gait, strength, tone, movements noted in Neurologic exam below  NEUROLOGIC: MENTAL STATUS:  No flowsheet data  found.  awake, alert, oriented to person, place and time  recent and remote memory intact  normal attention and concentration  language fluent, comprehension intact, naming intact  fund of knowledge appropriate  CRANIAL NERVE:   2nd - no papilledema on fundoscopic exam  2nd, 3rd, 4th, 6th - pupils equal and reactive to light, visual fields full to confrontation, extraocular muscles intact, no nystagmus  5th - facial sensation symmetric  7th - facial strength symmetric  8th - hearing intact  9th - palate elevates symmetrically, uvula midline  11th - shoulder shrug symmetric  12th - tongue protrusion midline  MOTOR:   normal bulk and tone, full strength in the BUE, BLE  SENSORY:   normal and symmetric to light touch, temperature, vibration  COORDINATION:   finger-nose-finger, fine finger movements normal  REFLEXES:   deep tendon reflexes present and symmetric  GAIT/STATION:   narrow based gait     DIAGNOSTIC DATA (LABS, IMAGING, TESTING) - I reviewed patient records, labs, notes, testing and imaging myself where available.  Lab Results  Component Value Date   WBC 10.7 (H) 12/15/2019   HGB 14.1 12/15/2019   HCT 42.8 12/15/2019   MCV 90.5 12/15/2019   PLT 294 12/15/2019      Component Value Date/Time   NA 135 12/15/2019 1850   K 4.8 12/15/2019 1850   CL 96 (L) 12/15/2019 1850   CO2 28 12/15/2019 1850   GLUCOSE 112 (H) 12/15/2019 1850   BUN 10 12/15/2019 1850   CREATININE  0.85 12/15/2019 1850   CALCIUM 9.8 12/15/2019 1850   PROT 6.9 12/15/2019 1850   ALBUMIN 3.9 12/15/2019 1850   AST 30 12/15/2019 1850   ALT 32 12/15/2019 1850   ALKPHOS 69 12/15/2019 1850   BILITOT 0.8 12/15/2019 1850   GFRNONAA >60 12/15/2019 1850   GFRAA >60 12/15/2019 1850   Lab Results  Component Value Date   CHOL 144 11/04/2019   HDL 54 11/04/2019   LDLCALC 27 11/04/2019   TRIG 317 (H) 11/04/2019   CHOLHDL 2.7 11/04/2019   Lab Results  Component Value Date   HGBA1C 5.5 11/03/2019   No results found for: VITAMINB12 No results found for: TSH   11/04/19 TTE 1. Mild LVH. Normal LV size. Mild global hypokinesis. LVEF 45-50%. Normal  diastolic function.  2. Normal RV size and function.  3. No significant valvular abnormality.  4. Normal right atrial pressure.   12/15/19 CTA head /neck - No emergent large vessel occlusion or high-grade stenosis of the intracranial arteries or cervical. - Aortic atherosclerosis (ICD10-I70.0).  12/16/19 MRI brain [I reviewed images myself and agree with interpretation. -VRP]  1. No acute intracranial abnormality. 2. Advanced atrophy and findings of chronic small vessel disease.    ASSESSMENT AND PLAN  54 y.o. year old male here with transient aphasia on 12/15/2019, possible TIA.  Dx:  1. TIA (transient ischemic attack)   2. Aphasia      PLAN:  TRANSIENT APHASIA (? TIA vs migraine vs medication side effect (ambien)) - continue plavix 75mg  daily, atorvastatin 40mg  daily, lisinopril - continue exercise and nutrition changes  Return for pending if symptoms worsen or fail to improve, return to PCP.    , MD 01/03/2020, 11:32 AM Certified in Neurology, Neurophysiology and Neuroimaging  [facility] [address], [address] [place], [address] [place] [phone]

## 2020-01-03 NOTE — Patient Instructions (Signed)
TRANSIENT APHASIA (? TIA vs migraine vs medication side effect (ambien))  - continue plavix 75mg  daily, atorvastatin 40mg  daily, lisinopril  - continue exercise and nutrition changes

## 2020-01-04 ENCOUNTER — Other Ambulatory Visit: Payer: Self-pay | Admitting: Cardiology

## 2020-01-04 ENCOUNTER — Other Ambulatory Visit: Payer: Self-pay | Admitting: Surgical

## 2020-01-06 ENCOUNTER — Other Ambulatory Visit: Payer: Self-pay | Admitting: Surgical

## 2020-01-10 ENCOUNTER — Other Ambulatory Visit: Payer: Self-pay

## 2020-01-10 MED ORDER — LISINOPRIL 10 MG PO TABS: 10.0000 mg | ORAL_TABLET | Freq: Every day | ORAL | 1 refills | Status: DC

## 2020-01-13 ENCOUNTER — Telehealth (HOSPITAL_COMMUNITY): Payer: Self-pay

## 2020-01-13 NOTE — Telephone Encounter (Signed)
Pt called and stated that he wasn't going to be able to make the sessions on 7/21, 7/23, 8/23, 8/25, and 8/27. Took those appointment days out for cardiac rehab sessions for the pt and changed his grad date to 8/20.

## 2020-01-18 DIAGNOSIS — I2510 Atherosclerotic heart disease of native coronary artery without angina pectoris: Secondary | ICD-10-CM | POA: Diagnosis not present

## 2020-01-18 DIAGNOSIS — M109 Gout, unspecified: Secondary | ICD-10-CM | POA: Diagnosis not present

## 2020-01-18 DIAGNOSIS — M25471 Effusion, right ankle: Secondary | ICD-10-CM | POA: Diagnosis not present

## 2020-01-21 ENCOUNTER — Telehealth (HOSPITAL_COMMUNITY): Payer: Self-pay

## 2020-01-21 NOTE — Telephone Encounter (Signed)
Cardiac Rehab Medication Review by a Pharmacist  Does the patient  feel that his/her medications are working for him/her?  yes  Has the patient been experiencing any side effects to the medications prescribed?  no  Does the patient measure his/her own blood pressure or blood glucose at home?  yes BP usually very good around 110/75  Does the patient have any problems obtaining medications due to transportation or finances?   no  Understanding of regimen: excellent Understanding of indications: excellent Potential of compliance: excellent    Pharmacist comments: Pt able to state indication for all medications. Instructed pt to ask for 90-day supply to avoid running out of medications.    Lulu Riding, PharmD PGY1 Pharmacy Resident  Please check AMION for all [facility] Pharmacy phone numbers After 10:00 PM, call Main Pharmacy [phone]  01/21/2020 1:29 PM

## 2020-01-30 ENCOUNTER — Telehealth (HOSPITAL_COMMUNITY): Payer: Self-pay | Admitting: *Deleted

## 2020-01-30 ENCOUNTER — Encounter (HOSPITAL_COMMUNITY)
Admission: RE | Admit: 2020-01-30 | Discharge: 2020-01-30 | Disposition: A | Discharge status: Home / Self Care | Payer: BC Managed Care – PPO | Source: Ambulatory Visit | Attending: Cardiology | Admitting: Cardiology

## 2020-01-30 ENCOUNTER — Other Ambulatory Visit: Payer: Self-pay

## 2020-01-30 DIAGNOSIS — I2111 ST elevation (STEMI) myocardial infarction involving right coronary artery: Secondary | ICD-10-CM | POA: Insufficient documentation

## 2020-01-30 DIAGNOSIS — Z951 Presence of aortocoronary bypass graft: Secondary | ICD-10-CM | POA: Insufficient documentation

## 2020-01-30 NOTE — Telephone Encounter (Signed)
Confirmed appointment for orientation. Health history completed.[name], RN,BSN 01/30/2020 2:57 PM

## 2020-01-31 ENCOUNTER — Other Ambulatory Visit: Payer: Self-pay

## 2020-01-31 ENCOUNTER — Encounter (HOSPITAL_COMMUNITY): Payer: Self-pay

## 2020-01-31 ENCOUNTER — Telehealth (HOSPITAL_COMMUNITY): Payer: Self-pay

## 2020-01-31 ENCOUNTER — Encounter (HOSPITAL_COMMUNITY)
Admission: RE | Admit: 2020-01-31 | Discharge: 2020-01-31 | Disposition: A | Discharge status: Home / Self Care | Payer: BC Managed Care – PPO | Source: Ambulatory Visit | Attending: Cardiology | Admitting: Cardiology

## 2020-01-31 VITALS — BP 104/68 | HR 94 | Ht 68.5 in | Wt 226.4 lb

## 2020-01-31 DIAGNOSIS — I2111 ST elevation (STEMI) myocardial infarction involving right coronary artery: Secondary | ICD-10-CM

## 2020-01-31 DIAGNOSIS — Z951 Presence of aortocoronary bypass graft: Secondary | ICD-10-CM

## 2020-01-31 HISTORY — DX: Atherosclerotic heart disease of native coronary artery without angina pectoris: I25.10

## 2020-01-31 NOTE — Telephone Encounter (Signed)
**  UPDATE**  Pt insurance is active and benefits verified through [employer]. Co-pay $0.00, DED $250.00/$250.00 met, out of pocket $2,000.00/$2,000.00 met, co-insurance 20%. No pre-authorization required. Passport, 01/31/20 @ 9:16AM, [id number]

## 2020-01-31 NOTE — Progress Notes (Signed)
Patient here for cardiac rehab orientation initial blood pressure 104/68 sitting. Standing blood pressure 105/74. Heart rate 94. Patient proceeded to complete his walk test without difficulty or complaints. Immediate post walk test BP 114/68 sitting. Standing blood pressure 96/58. Asymptomatic.Patient was given water. Recheck blood pressure 105/74 sitting. 103/69 Standing. Sean Flynn reports that he feels light headed at home at times when he changes position. Sean Flynn also reports that he gets systolic  blood pressure reading in the 90's at times. Dr Crawford Givens office called and notified. Will forward today's blood pressure's to Dr Damian Leavell office for review.[name], RN,BSN 01/31/2020 11:31 AM

## 2020-01-31 NOTE — Progress Notes (Signed)
Cardiac Individual Treatment Plan  Patient Details  Name: Sean Flynn MRN: [id number] Date of Birth: Apr 11, 1966 Referring Provider:     CARDIAC REHAB PHASE II ORIENTATION from 01/31/2020 in [facility] CARDIAC [facility]  Referring Provider [name],  Dr. 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  allopurinol (ZYLOPRIM) 100 MG tablet, Take 200 mg by mouth daily. , Disp: , Rfl:  .  atorvastatin (LIPITOR) 40 MG tablet, TAKE 1 TABLET (40 MG TOTAL) BY MOUTH DAILY AT 6 PM., Disp: 90 tablet, Rfl: 1 .  clopidogrel (PLAVIX) 75 MG tablet, Take 1 tablet (75 mg total) by mouth daily., Disp: 30 tablet, Rfl: 3 .  colchicine 0.6 MG tablet, Take 0.6 mg by mouth 2 (two) times daily. , Disp: , Rfl:  .  lisinopril (ZESTRIL) 10 MG tablet, Take 1 tablet (10 mg total) by mouth daily., Disp: 30 tablet, Rfl: 1 .  Multiple Vitamin (MULTIVITAMIN) tablet, Take 1 tablet by mouth at bedtime. , Disp: , Rfl:  .  Omega-3 Fatty Acids (OMEGA-3 FISH OIL) 1200 MG CAPS, Take 2 capsules by mouth at bedtime. , Disp: , Rfl:  .  pantoprazole (PROTONIX) 40 MG tablet, Take 1 tablet (40 mg total) by mouth daily. (Patient taking differently: Take 40 mg by mouth daily as needed. ), Disp: 60 tablet, Rfl: 2 .  predniSONE (DELTASONE) 5 MG tablet, Take 5 mg by mouth daily with breakfast., Disp: , Rfl:  .  zolpidem (AMBIEN) 5 MG tablet, Take 2.5-5 mg by mouth at bedtime as needed for sleep. , Disp: , Rfl:   Past Medical History: Past Medical History:  Diagnosis Date  . Anxiety   . Coronary artery disease   . Former smoker   . Hypertension   . Mixed hyperlipidemia   . TIA (transient ischemic attack)      Tobacco Use: Social History   Tobacco Use  Smoking Status Former Smoker  . Packs/day: 1.00  . Years: 25.00  . Pack years: 25.00  . Types: Cigarettes  . Quit date: 2010  . Years since quitting: 11.4  Smokeless Tobacco Never Used    Labs: Recent Hydrographic surveyor    Labs for ITP Cardiac and Pulmonary Rehab Latest Ref Rng & Units 11/07/2019 11/07/2019 11/07/2019 11/07/2019 11/07/2019   Cholestrol 0 - 200 mg/dL - - - - -   LDLCALC 0 - 99 mg/dL - - - - -   HDL >09 mg/dL - - - - -   Trlycerides <150 mg/dL - - - - -   Hemoglobin A1c 4.8 - 5.6 % - - - - -   PHART 7.35 - 7.45 - 7.308(L) - 7.300(L) 7.388   PCO2ART 32 - 48 mmHg - 48.7(H) - 49.3(H) 37.6   HCO3 20.0 - 28.0 mmol/L - 24.4 - 24.2 22.7   TCO2 22 - 32 mmol/L 27 26 25 26 24    ACIDBASEDEF 0.0 - 2.0 mmol/L - 2.0 - 3.0(H) 2.0   O2SAT % - 95.0 - 95.0 97.0      Capillary Blood Glucose: Lab Results  Component Value Date   GLUCAP 133 (H) 11/09/2019   GLUCAP 118 (H) 11/09/2019   GLUCAP 118 (  H) 11/08/2019   GLUCAP 139 (H) 11/08/2019   GLUCAP 140 (H) 11/08/2019     Exercise Target Goals: Exercise Program Goal: Individual exercise prescription set using results from initial 6 min walk test and THRR while considering  patient's activity barriers and safety.   Exercise Prescription Goal: Starting with aerobic activity 30 plus minutes a day, 3 days per week for initial exercise prescription. Provide home exercise prescription and guidelines that participant acknowledges understanding prior to discharge.  Activity Barriers & Risk Stratification:  Activity Barriers & Cardiac Risk Stratification - 01/31/20 1303      Activity Barriers & Cardiac Risk Stratification   Activity Barriers Other (comment)    Comments dizziness    Cardiac Risk Stratification High           6 Minute Walk:  6 Minute Walk    Row Name 01/31/20 1038         6 Minute Walk   Phase Initial     Distance 1411 feet     Walk Time 6 minutes     # of  Rest Breaks 0     MPH 2.67     METS 3.45     RPE 12     Perceived Dyspnea  1     VO2 Peak 12.08     Symptoms Yes (comment)     Comments RPD = 1     Resting HR 94 bpm     Resting BP 104/68     Resting Oxygen Saturation  97 %     Exercise Oxygen Saturation  during 6 min walk 99 %     Max Ex. HR 110 bpm     Max Ex. BP 114/68     2 Minute Post BP 100/60            Oxygen Initial Assessment:   Oxygen Re-Evaluation:   Oxygen Discharge (Final Oxygen Re-Evaluation):   Initial Exercise Prescription:  Initial Exercise Prescription - 01/31/20 1300      Date of Initial Exercise RX and Referring Provider   Date 01/31/20    Referring Provider [name],  Dr. Evlyn Clines    Expected Discharge Date 03/30/20      Recumbant Bike   Level 2    RPM 60    Minutes 15    METs 3.5      Arm Ergometer   Level 2    Watts 5    Minutes 15      Prescription Details   Frequency (times per week) 3    Duration Progress to 30 minutes of continuous aerobic without signs/symptoms of physical distress      Intensity   THRR 40-80% of Max Heartrate 66-132    Ratings of Perceived Exertion 11-13    Perceived Dyspnea 0-4      Progression   Progression Continue progressive overload as per policy without signs/symptoms or physical distress.      Resistance Training   Training Prescription Yes    Weight 5    Reps 10-15           Perform Capillary Blood Glucose checks as needed.  Exercise Prescription Changes:   Exercise Comments:   Exercise Goals and Review:   Exercise Goals    Row Name 01/31/20 1307             Exercise Goals   Increase Physical Activity Yes       Intervention Provide advice, education, support and counseling about physical activity/exercise needs.;Develop an  individualized exercise prescription for aerobic and resistive training based on initial evaluation findings, risk stratification, comorbidities and participant's personal goals.       Expected Outcomes  Short Term: Attend rehab on a regular basis to increase amount of physical activity.;Long Term: Add in home exercise to make exercise part of routine and to increase amount of physical activity.;Long Term: Exercising regularly at least 3-5 days a week.       Increase Strength and Stamina Yes       Intervention Provide advice, education, support and counseling about physical activity/exercise needs.;Develop an individualized exercise prescription for aerobic and resistive training based on initial evaluation findings, risk stratification, comorbidities and participant's personal goals.       Expected Outcomes Short Term: Increase workloads from initial exercise prescription for resistance, speed, and METs.;Short Term: Perform resistance training exercises routinely during rehab and add in resistance training at home;Long Term: Improve cardiorespiratory fitness, muscular endurance and strength as measured by increased METs and functional capacity ( )       Able to understand and use rate of perceived exertion (RPE) scale Yes       Intervention Provide education and explanation on how to use RPE scale       Expected Outcomes Short Term: Able to use RPE daily in rehab to express subjective intensity level;Long Term:  Able to use RPE to guide intensity level when exercising independently       Knowledge and understanding of Target Heart Rate Range (THRR) Yes       Intervention Provide education and explanation of THRR including how the numbers were predicted and where they are located for reference       Expected Outcomes Short Term: Able to state/look up THRR;Short Term: Able to use daily as guideline for intensity in rehab;Long Term: Able to use THRR to govern intensity when exercising independently       Able to check pulse independently Yes       Intervention Provide education and demonstration on how to check pulse in carotid and radial arteries.;Review the importance of being able to check your own  pulse for safety during independent exercise       Expected Outcomes Short Term: Able to explain why pulse checking is important during independent exercise;Long Term: Able to check pulse independently and accurately       Understanding of Exercise Prescription Yes       Intervention Provide education, explanation, and written materials on patient's individual exercise prescription       Expected Outcomes Short Term: Able to explain program exercise prescription;Long Term: Able to explain home exercise prescription to exercise independently              Exercise Goals Re-Evaluation :    Discharge Exercise Prescription (Final Exercise Prescription Changes):   Nutrition:  Target Goals: Understanding of nutrition guidelines, daily intake of sodium 1500mg , cholesterol 200mg , calories 30% from fat and 7% or less from saturated fats, daily to have 5 or more servings of fruits and vegetables.  Biometrics:  Pre Biometrics - 01/31/20 1234      Pre Biometrics   Waist Circumference 44 inches    Hip Circumference 42.5 inches    Waist to Hip Ratio 1.04 %    Triceps Skinfold 8 mm    % Body Fat 28.8 %    Grip Strength 49 kg    Flexibility 13.5 in    Single Leg Stand 54 seconds  Nutrition Therapy Plan and Nutrition Goals:   Nutrition Assessments:   Nutrition Goals Re-Evaluation:   Nutrition Goals Discharge (Final Nutrition Goals Re-Evaluation):   Psychosocial: Target Goals: Acknowledge presence or absence of significant depression and/or stress, maximize coping skills, provide positive support system. Participant is able to verbalize types and ability to use techniques and skills needed for reducing stress and depression.  Initial Review & Psychosocial Screening:  Initial Psych Review & Screening - 01/31/20 1346      Initial Review   Current issues with None Identified      Family Dynamics   Good Support System? Yes   Gala Romney has his wife for support      Barriers   Psychosocial barriers to participate in program There are no identifiable barriers or psychosocial needs.      Screening Interventions   Interventions Encouraged to exercise           Quality of Life Scores:  Quality of Life - 01/31/20 1247      Quality of Life Scores   Health/Function Pre 27.2 %    Socioeconomic Pre 28.31 %    Psych/Spiritual Pre 25.5 %    Family Pre 30 %    GLOBAL Pre 27.51 %          Scores of 19 and below usually indicate a poorer quality of life in these areas.  A difference of  2-3 points is a clinically meaningful difference.  A difference of 2-3 points in the total score of the Quality of Life Index has been associated with significant improvement in overall quality of life, self-image, physical symptoms, and general health in studies assessing change in quality of life.  PHQ-9: Recent Review Flowsheet Data    Depression screen [facility] 2/9 01/31/2020   Decreased Interest 0   Down, Depressed, Hopeless 0   PHQ - 2 Score 0     Interpretation of Total Score  Total Score Depression Severity:  1-4 = Minimal depression, 5-9 = Mild depression, 10-14 = Moderate depression, 15-19 = Moderately severe depression, 20-27 = Severe depression   Psychosocial Evaluation and Intervention:   Psychosocial Re-Evaluation:   Psychosocial Discharge (Final Psychosocial Re-Evaluation):   Vocational Rehabilitation: Provide vocational rehab assistance to qualifying candidates.   Vocational Rehab Evaluation & Intervention:  Vocational Rehab - 01/31/20 1349      Initial Vocational Rehab Evaluation & Intervention   Assessment shows need for Vocational Rehabilitation No   Gala Romney is retired and does not need vocational rehab at this time          Education: Education Goals: Education classes will be provided on a weekly basis, covering required topics. Participant will state understanding/return demonstration of topics presented.  Learning  Barriers/Preferences:  Learning Barriers/Preferences - 01/31/20 1256      Learning Barriers/Preferences   Learning Barriers Sight   wears glasses   Learning Preferences Skilled Demonstration           Education Topics: Hypertension, Hypertension Reduction -Define heart disease and high blood pressure. Discus how high blood pressure affects the body and ways to reduce high blood pressure.   Exercise and Your Heart -Discuss why it is important to exercise, the FITT principles of exercise, normal and abnormal responses to exercise, and how to exercise safely.   Angina -Discuss definition of angina, causes of angina, treatment of angina, and how to decrease risk of having angina.   Cardiac Medications -Review what the following cardiac medications are used for, how they affect the  body, and side effects that may occur when taking the medications.  Medications include Aspirin, Beta blockers, calcium channel blockers, ACE Inhibitors, angiotensin receptor blockers, diuretics, digoxin, and antihyperlipidemics.   Congestive Heart Failure -Discuss the definition of CHF, how to live with CHF, the signs and symptoms of CHF, and how keep track of weight and sodium intake.   Heart Disease and Intimacy -Discus the effect sexual activity has on the heart, how changes occur during intimacy as we age, and safety during sexual activity.   Smoking Cessation / COPD -Discuss different methods to quit smoking, the health benefits of quitting smoking, and the definition of COPD.   Nutrition I: Fats -Discuss the types of cholesterol, what cholesterol does to the heart, and how cholesterol levels can be controlled.   Nutrition II: Labels -Discuss the different components of food labels and how to read food label   Heart Parts/Heart Disease and PAD -Discuss the anatomy of the heart, the pathway of blood circulation through the heart, and these are affected by heart disease.   Stress I: Signs  and Symptoms -Discuss the causes of stress, how stress may lead to anxiety and depression, and ways to limit stress.   Stress II: Relaxation -Discuss different types of relaxation techniques to limit stress.   Warning Signs of Stroke / TIA -Discuss definition of a stroke, what the signs and symptoms are of a stroke, and how to identify when someone is having stroke.   Knowledge Questionnaire Score:  Knowledge Questionnaire Score - 01/31/20 1256      Knowledge Questionnaire Score   Pre Score 21/24           Core Components/Risk Factors/Patient Goals at Admission:  Personal Goals and Risk Factors at Admission - 01/31/20 1258      Core Components/Risk Factors/Patient Goals on Admission    Weight Management Yes;Obesity;Weight Loss    Intervention Weight Management: Develop a combined nutrition and exercise program designed to reach desired caloric intake, while maintaining appropriate intake of nutrient and fiber, sodium and fats, and appropriate energy expenditure required for the weight goal.;Weight Management: Provide education and appropriate resources to help participant work on and attain dietary goals.;Weight Management/Obesity: Establish reasonable short term and long term weight goals.;Obesity: Provide education and appropriate resources to help participant work on and attain dietary goals.    Admit Weight 226 lb 6.6 oz (102.7 kg)    Goal Weight: Long Term 200 lb (90.7 kg)    Expected Outcomes Short Term: Continue to assess and modify interventions until short term weight is achieved;Long Term: Adherence to nutrition and physical activity/exercise program aimed toward attainment of established weight goal;Weight Loss: Understanding of general recommendations for a balanced deficit meal plan, which promotes 1-2 lb weight loss per week and includes a negative energy balance of [phone] kcal/d;Understanding recommendations for meals to include 15-35% energy as protein, 25-35% energy  from fat, 35-60% energy from carbohydrates, less than 200mg  of dietary cholesterol, 20-35 gm of total fiber daily;Understanding of distribution of calorie intake throughout the day with the consumption of 4-5 meals/snacks    Hypertension Yes    Intervention Provide education on lifestyle modifcations including regular physical activity/exercise, weight management, moderate sodium restriction and increased consumption of fresh fruit, vegetables, and low fat dairy, alcohol moderation, and smoking cessation.;Monitor prescription use compliance.    Expected Outcomes Short Term: Continued assessment and intervention until BP is < 140/4190mm HG in hypertensive participants. < 130/7080mm HG in hypertensive participants with diabetes, heart failure or chronic  kidney disease.;Long Term: Maintenance of blood pressure at goal levels.    Lipids Yes    Intervention Provide education and support for participant on nutrition & aerobic/resistive exercise along with prescribed medications to achieve LDL 70mg , HDL >40mg .    Expected Outcomes Short Term: Participant states understanding of desired cholesterol values and is compliant with medications prescribed. Participant is following exercise prescription and nutrition guidelines.;Long Term: Cholesterol controlled with medications as prescribed, with individualized exercise RX and with personalized nutrition plan. Value goals: LDL < 70mg , HDL > 40 mg.           Core Components/Risk Factors/Patient Goals Review:    Core Components/Risk Factors/Patient Goals at Discharge (Final Review):    ITP Comments:  ITP Comments    Row Name 01/31/20 1329           ITP Comments Dr 02/02/20 MD, Medical Director              Comments:[name] attended orientation on 01/31/2020 to review rules and guidelines for program.  Completed 6 minute walk test, Intitial ITP, and exercise prescription.  VSS. Telemetry-Sinus Rhythm.  Asymptomatic. Safety measures and social distancing  in place per CDC guidelines. Please see previous note as 02/02/2020 had some orthostatic changes post walk test. Dr Gala Romney office notified.Damian Leavell, RN,BSN 01/31/2020 3:43 PM

## 2020-02-01 ENCOUNTER — Telehealth: Payer: Self-pay | Admitting: Cardiology

## 2020-02-01 DIAGNOSIS — R42 Dizziness and giddiness: Secondary | ICD-10-CM

## 2020-02-01 NOTE — Telephone Encounter (Signed)
-----   Message from Cammy Copa, RN sent at 01/31/2020 11:36 AM EDT ----- Peri [place] morning Dr Rosemary Holms, Mr Katen attended cardiac rehab orientation today please see attached vital signs is there any chance you could decrease his lisinopril?  Thanks for your assistance,  [name] RN Cardiac Rehab

## 2020-02-01 NOTE — Telephone Encounter (Signed)
Per. Dr. Rosemary Holms,  Yes. Please reduce lisinopril to 1/2 tab of 10 mg /day.pt also was made aware Thanks Delice Bison

## 2020-02-01 NOTE — Telephone Encounter (Signed)
Yes. Please reduce lisinopril to 1/2 tab of 10 mg /day.  Thanks MJP

## 2020-02-02 ENCOUNTER — Encounter (HOSPITAL_COMMUNITY): Payer: Self-pay

## 2020-02-02 ENCOUNTER — Other Ambulatory Visit: Payer: Self-pay

## 2020-02-02 ENCOUNTER — Emergency Department (HOSPITAL_COMMUNITY)
Admission: EM | Admit: 2020-02-02 | Discharge: 2020-02-02 | Disposition: A | Discharge status: Home / Self Care | Payer: BC Managed Care – PPO | Attending: Emergency Medicine | Admitting: Emergency Medicine

## 2020-02-02 ENCOUNTER — Telehealth: Payer: Self-pay | Admitting: Cardiology

## 2020-02-02 ENCOUNTER — Emergency Department (HOSPITAL_COMMUNITY): Payer: BC Managed Care – PPO

## 2020-02-02 DIAGNOSIS — Z87891 Personal history of nicotine dependence: Secondary | ICD-10-CM | POA: Diagnosis not present

## 2020-02-02 DIAGNOSIS — I10 Essential (primary) hypertension: Secondary | ICD-10-CM | POA: Insufficient documentation

## 2020-02-02 DIAGNOSIS — I4891 Unspecified atrial fibrillation: Secondary | ICD-10-CM | POA: Diagnosis not present

## 2020-02-02 DIAGNOSIS — R079 Chest pain, unspecified: Secondary | ICD-10-CM | POA: Diagnosis not present

## 2020-02-02 DIAGNOSIS — I2510 Atherosclerotic heart disease of native coronary artery without angina pectoris: Secondary | ICD-10-CM | POA: Insufficient documentation

## 2020-02-02 DIAGNOSIS — Z7901 Long term (current) use of anticoagulants: Secondary | ICD-10-CM | POA: Diagnosis not present

## 2020-02-02 DIAGNOSIS — M109 Gout, unspecified: Secondary | ICD-10-CM | POA: Diagnosis not present

## 2020-02-02 DIAGNOSIS — Z79899 Other long term (current) drug therapy: Secondary | ICD-10-CM | POA: Insufficient documentation

## 2020-02-02 DIAGNOSIS — R0789 Other chest pain: Secondary | ICD-10-CM | POA: Diagnosis not present

## 2020-02-02 DIAGNOSIS — Z951 Presence of aortocoronary bypass graft: Secondary | ICD-10-CM | POA: Diagnosis not present

## 2020-02-02 DIAGNOSIS — R000 Tachycardia, unspecified: Secondary | ICD-10-CM | POA: Diagnosis not present

## 2020-02-02 DIAGNOSIS — R0902 Hypoxemia: Secondary | ICD-10-CM | POA: Diagnosis not present

## 2020-02-02 LAB — TROPONIN I (HIGH SENSITIVITY)
Troponin I (High Sensitivity): 6 ng/L (ref <18)
Troponin I (High Sensitivity): 9 ng/L (ref <18)

## 2020-02-02 LAB — BASIC METABOLIC PANEL
Anion gap: 10 (ref 5–15)
BUN: 7 mg/dL (ref 6–20)
CO2: 26 mmol/L (ref 22–32)
Calcium: 9.6 mg/dL (ref 8.9–10.3)
Chloride: 97 mmol/L — ABNORMAL LOW (ref 98–111)
Creatinine, Ser: 0.95 mg/dL (ref 0.61–1.24)
GFR calc Af Amer: >60 mL/min (ref >60)
GFR calc non Af Amer: >60 mL/min (ref >60)
Glucose, Bld: 174 mg/dL — ABNORMAL HIGH (ref 70–99)
Potassium: 4.3 mmol/L (ref 3.5–5.1)
Sodium: 133 mmol/L — ABNORMAL LOW (ref 135–145)

## 2020-02-02 LAB — CBC
HCT: 44.1 % (ref 39.0–52.0)
Hemoglobin: 14.5 g/dL (ref 13.0–17.0)
MCH: 28 pg (ref 26.0–34.0)
MCHC: 32.9 g/dL (ref 30.0–36.0)
MCV: 85.3 fL (ref 80.0–100.0)
Platelets: 229 10*3/uL (ref 150–400)
RBC: 5.17 MIL/uL (ref 4.22–5.81)
RDW: 14.5 % (ref 11.5–15.5)
WBC: 12.1 10*3/uL — ABNORMAL HIGH (ref 4.0–10.5)
nRBC: 0 % (ref 0.0–0.2)

## 2020-02-02 MED ORDER — METOPROLOL TARTRATE 25 MG PO TABS
25.0000 mg | ORAL_TABLET | Freq: Two times a day (BID) | ORAL | 1 refills | Status: DC
Start: 2020-02-02 — End: 2020-02-07

## 2020-02-02 MED ORDER — RIVAROXABAN 20 MG PO TABS
20.0000 mg | ORAL_TABLET | Freq: Every day | ORAL | 0 refills | Status: DC
Start: 2020-02-02 — End: 2020-03-01

## 2020-02-02 MED ORDER — SODIUM CHLORIDE 0.9 % IV BOLUS
250.0000 mL | Freq: Once | INTRAVENOUS | Status: AC
  Administered 2020-02-02: 250 mL via INTRAVENOUS

## 2020-02-02 MED ORDER — METOPROLOL TARTRATE 25 MG PO TABS
25.0000 mg | ORAL_TABLET | Freq: Once | ORAL | Status: AC
  Administered 2020-02-02: 25 mg via ORAL
  Filled 2020-02-02: qty 1

## 2020-02-02 MED ORDER — RIVAROXABAN 20 MG PO TABS
20.0000 mg | ORAL_TABLET | Freq: Once | ORAL | Status: AC
  Administered 2020-02-02: 20 mg via ORAL
  Filled 2020-02-02: qty 1

## 2020-02-02 MED ORDER — METOPROLOL TARTRATE 5 MG/5ML IV SOLN: 5.0000 mg | Freq: Once | INTRAVENOUS | Status: DC

## 2020-02-02 MED ORDER — SODIUM CHLORIDE 0.9 % IV BOLUS: 1000.0000 mL | Freq: Once | INTRAVENOUS | Status: DC

## 2020-02-02 NOTE — Telephone Encounter (Signed)
Patient presented with new onset atrial fibrillation with RVR, has converted to sinus rhythm while discussions were held by the ED physician for cardioversion.  New onset A. fib.  I have recommended that he can be discharged home on Xarelto 20 mg in the evening and metoprolol tartrate 25 mg p.o. twice daily.  He will need follow-up visit with [place].  EKG 02/02/2020: 8:45 AM: A. fib with RVR, rate related inferior and lateral ST depression suggestive of ischemia, ventricular rate 166 bpm.  Repeat EKG at 12:09 PM normal sinus rhythm.  No ischemic changes.   Sean Decamp, MD, [facility] 02/02/2020, 12:15 PM Office: [phone]

## 2020-02-02 NOTE — Telephone Encounter (Signed)
Thank you for seeing him.  Staff, needs closer f/u than 8/5-preferably 1-2 weeks.  Thanks MJP

## 2020-02-02 NOTE — Discharge Instructions (Addendum)

## 2020-02-02 NOTE — ED Notes (Signed)
Paged cards per PA

## 2020-02-02 NOTE — Telephone Encounter (Signed)
Pt scheduled for 02/17/2020 @ 1:45pm. I see he is still in hosp. I will call him to confirm appt.

## 2020-02-02 NOTE — ED Provider Notes (Signed)
[facility] EMERGENCY DEPARTMENT Provider Note   CSN: [id number] Arrival date & time: 02/02/20  1007     History Chief Complaint  Patient presents with  . Tachycardia  . Chest Pain    Sean Flynn is a 54 y.o. male.  HPI      Sean Flynn is a 54 y.o. male, with a history of anxiety, CABG, HTN, hyperlipidemia, presenting to the ED with palpitations that began around 7 AM shortly after waking this morning. Patient states he was at rest when he started to feel his heart racing.  He took his pulse and noted it to be 170.  He has not had this issue before.  He states he had a CABG performed in April 2021.  He was on a beta-blocker afterward, however, he states they removed him from the beta-blocker shortly thereafter because it flared his gout.  He voices compliance with all his medications.  He has been undergoing cardiac rehab without difficulties.  He walked yesterday without any discomfort or symptoms. He endorses some soreness in a small area to the right of his sternum that he thinks may be soreness from working out.  This pain feels very different from the pain he had with his MI. He has not yet had any caffeine today.  Denies alcohol or illicit drug use. Denies fever/chills, recent illness, cough, abdominal pain, dizziness, syncope, shortness of breath, other chest pain, or any other complaints.   Past Medical History:  Diagnosis Date  . Anxiety   . Coronary artery disease   . Former smoker   . Hypertension   . Mixed hyperlipidemia   . TIA (transient ischemic attack)     Patient Active Problem List   Diagnosis Date Noted  . PAD (peripheral artery disease) (HCC) 12/24/2019  . Gout 11/23/2019  . 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 Coronary artery disease of bypass graft of native heart with stable angina pectoris (HCC) 11/14/2019  . Family history of early CAD 11/14/2019  . Acute coronary syndrome (HCC) 11/03/2019  . Chronic bilateral low  back pain with left-sided sciatica 10/14/2019  . Family history of colon cancer 10/14/2019  . Hyperglycemia 10/14/2019  . Numbness 10/14/2019  . Visual changes 10/14/2019  . Chronic gout of multiple sites 06/15/2019  . Chronic mid back pain 06/15/2019  . Elevated liver function tests 06/15/2019  . Laceration of extensor muscle, fascia and tendon of right thumb at wrist and hand level, initial encounter 04/05/2019  . Severe obesity (BMI 35.0-39.9) with comorbidity (HCC) 01/18/2019  . Atherosclerosis of aorta (HCC) 11/16/2017  . Alcohol use 06/11/2016  . Atypical chest pain 06/11/2016  . Benign essential HTN 06/11/2016  . Insomnia 06/11/2016    Past Surgical History:  Procedure Laterality Date  . CARDIAC CATHETERIZATION    . CORONARY ARTERY BYPASS GRAFT N/A 11/07/2019   Procedure: CORONARY ARTERY BYPASS GRAFTING (CABG) x 2 WITH BILATERAL INTERNAL MAMMARY ARTERIES. LIMA TO LAD, RIMA TO DISTAL RCA;  Surgeon: Loreli Slot, MD;  Location: [facility] OR;  Service: Open Heart Surgery;  Laterality: N/A;  . LEFT HEART CATH AND CORONARY ANGIOGRAPHY N/A 11/03/2019   Procedure: LEFT HEART CATH AND CORONARY ANGIOGRAPHY;  Surgeon: Elder Negus, MD;  Location: [facility] INVASIVE CV LAB;  Service: Cardiovascular;  Laterality: N/A;  . REPAIR EXTENSOR TENDON Right 04/07/2019   Procedure: REPAIR RIGHT THUMB EXTENSOR TENDON;  Surgeon: Betha Loa, MD;  Location: [facility];  Service: Orthopedics;  Laterality: Right;  . TEE WITHOUT CARDIOVERSION  N/A 11/07/2019   Procedure: Transesophageal Echocardiogram (Tee);  Surgeon: Loreli Slot, MD;  Location: [facility] OR;  Service: Open Heart Surgery;  Laterality: N/A;       Family History  Problem Relation Age of Onset  . CAD Father        MI in early 66s  . Cancer Father   . Heart failure Father   . Dementia Mother   . Healthy Brother     Social History   Tobacco Use  . Smoking status: Former Smoker    Packs/day: 1.00    Years: 25.00     Pack years: 25.00    Types: Cigarettes    Quit date: 2010    Years since quitting: 11.5  . Smokeless tobacco: Never Used  Vaping Use  . Vaping Use: Never used  Substance Use Topics  . Alcohol use: Not Currently    Alcohol/week: 6.0 - 10.0 standard drinks    Types: 6 - 10 Cans of beer per week    Comment: Daily, 01/03/20 none  . Drug use: Never    Home Medications Prior to Admission medications   Medication Sig Start Date End Date Taking? Authorizing Provider  allopurinol (ZYLOPRIM) 100 MG tablet Take 200 mg by mouth daily.  12/21/19  Yes [provider]  atorvastatin (LIPITOR) 40 MG tablet TAKE 1 TABLET (40 MG TOTAL) BY MOUTH DAILY AT 6 PM. 01/04/20  Yes [name], Manish J, MD  clopidogrel (PLAVIX) 75 MG tablet Take 1 tablet (75 mg total) by mouth daily. 12/23/19  Yes [name], Manish J, MD  colchicine 0.6 MG tablet Take 0.6 mg by mouth 2 (two) times daily.    Yes Alver Fisher, RN  lisinopril (ZESTRIL) 10 MG tablet Take 1 tablet (10 mg total) by mouth daily. Patient taking differently: Take 5 mg by mouth daily.  01/10/20  Yes Yates Decamp, MD  Multiple Vitamin (MULTIVITAMIN) tablet Take 1 tablet by mouth daily.    Yes Alver Fisher, RN  Omega-3 Fatty Acids (OMEGA-3 FISH OIL) 1200 MG CAPS Take 2,400 mg by mouth at bedtime.    Yes Alver Fisher, RN  pantoprazole (PROTONIX) 40 MG tablet Take 1 tablet (40 mg total) by mouth daily. Patient taking differently: Take 40 mg by mouth daily as needed (acid reflux).  11/14/19  Yes [name], Manish J, MD  predniSONE (DELTASONE) 5 MG tablet Take 5 mg by mouth daily with breakfast.   Yes [provider]  psyllium (METAMUCIL) 58.6 % packet Take 1 packet by mouth daily.   Yes [provider]  zolpidem (AMBIEN) 5 MG tablet Take 2.5-5 mg by mouth at bedtime as needed for sleep.    Yes Alver Fisher, RN  metoprolol tartrate (LOPRESSOR) 25 MG tablet Take 1 tablet (25 mg total) by mouth 2 (two) times daily. 02/02/20 04/02/20   [place] Fant C, PA-C  rivaroxaban (XARELTO) 20 MG TABS tablet Take 1 tablet (20 mg total) by mouth daily with supper. 02/02/20   Ajai Terhaar, Hillard Danker, PA-C    Allergies    Oxycodone-acetaminophen  Review of Systems   Review of Systems  Constitutional: Negative for chills, diaphoresis and fever.  Respiratory: Negative for cough and shortness of breath.   Cardiovascular: Positive for palpitations. Negative for chest pain and leg swelling.  Gastrointestinal: Negative for abdominal pain, diarrhea, nausea and vomiting.  Musculoskeletal: Negative for back pain.  Neurological: Negative for dizziness, syncope, weakness and numbness.  All other systems reviewed and are negative.  Physical Exam Updated Vital Signs BP 127/80   Pulse (!) 155   Temp 98.5 F (36.9 C)   Resp 18   Ht 5\' 9"  (1.753 m)   Wt 99.8 kg   SpO2 98%   BMI 32.49 kg/m   Physical Exam Vitals and nursing note reviewed.  Constitutional:      General: He is not in acute distress.    Appearance: He is well-developed. He is not diaphoretic.  HENT:     Head: Normocephalic and atraumatic.     Mouth/Throat:     Mouth: Mucous membranes are moist.     Pharynx: Oropharynx is clear.  Eyes:     Conjunctiva/sclera: Conjunctivae normal.  Cardiovascular:     Rate and Rhythm: Regular rhythm. Tachycardia present.     Pulses: Normal pulses.          Radial pulses are 2+ on the right side and 2+ on the left side.       Posterior tibial pulses are 2+ on the right side and 2+ on the left side.     Heart sounds: Normal heart sounds.     Comments: Tactile temperature in the extremities appropriate and equal bilaterally. At times, patient's heart rate seems regular, but at others it seems irregularly irregular. Pulmonary:     Effort: Pulmonary effort is normal. No respiratory distress.     Breath sounds: Normal breath sounds.  Chest:    Abdominal:     Palpations: Abdomen is soft.     Tenderness: There is no abdominal tenderness. There is  no guarding.  Musculoskeletal:     Cervical back: Neck supple.     Right lower leg: No edema.     Left lower leg: No edema.  Lymphadenopathy:     Cervical: No cervical adenopathy.  Skin:    General: Skin is warm and dry.  Neurological:     Mental Status: He is alert.  Psychiatric:        Mood and Affect: Mood and affect normal.        Speech: Speech normal.        Behavior: Behavior normal.     ED Results / Procedures / Treatments   Labs (all labs ordered are listed, but only abnormal results are displayed) Labs Reviewed  BASIC METABOLIC PANEL - Abnormal; Notable for the following components:      Result Value   Sodium 133 (*)    Chloride 97 (*)    Glucose, Bld 174 (*)    All other components within normal limits  CBC - Abnormal; Notable for the following components:   WBC 12.1 (*)    All other components within normal limits  TROPONIN I (HIGH SENSITIVITY)  TROPONIN I (HIGH SENSITIVITY)    EKG EKG Interpretation  Date/Time:  Thursday February 02 2020 10:13:20 EDT Ventricular Rate:  166 PR Interval:  86 QRS Duration: 84 QT Interval:  284 QTC Calculation: 471 R Axis:   7 Text Interpretation: Sinus tachycardia with short PR Inferior infarct , age undetermined Marked ST abnormality, possible lateral subendocardial injury Abnormal ECG Confirmed by Cherlynn PerchesKatz, Eric ([id number]) on 02/02/2020 11:03:22 AM    EKG Interpretation  Date/Time:  Thursday February 02 2020 12:09:20 EDT Ventricular Rate:  101 PR Interval:  86 QRS Duration: 89 QT Interval:  300 QTC Calculation: 389 R Axis:   53 Text Interpretation: Sinus tachycardia Probable left atrial enlargement Low voltage, precordial leads Borderline T wave abnormalities Confirmed by Cherlynn PerchesKatz, Eric ([id number]) on 02/02/2020 12:15:38  PM       Radiology DG Chest 2 View  Result Date: 02/02/2020 CLINICAL DATA:  Chest pain and tachycardia EXAM: CHEST - 2 VIEW COMPARISON:  Dec 06, 2019 FINDINGS: Lungs are clear. Heart is upper normal in size with  pulmonary vascularity normal. There is no adenopathy. Patient is status post coronary artery bypass grafting. There is aortic atherosclerosis. No bone lesions. IMPRESSION: Lungs clear. Heart upper normal in size. Postoperative changes noted. Aortic Atherosclerosis (ICD10-I70.0). Electronically Signed   By: Bretta Bang III M.D.   On: 02/02/2020 10:52    Procedures .1-3 Lead EKG Interpretation Performed by: Anselm Pancoast, PA-C Authorized by: Anselm Pancoast, PA-C     Interpretation: abnormal     ECG rate:  104   ECG rate assessment: tachycardic     Rhythm: sinus tachycardia     Ectopy: none     Conduction: normal   Comments:     Looks to have converted from A-fib RVR to Sinus tachycardia   (including critical care time)  Medications Ordered in ED Medications  sodium chloride 0.9 % bolus 250 mL (0 mLs Intravenous Stopped 02/02/20 1140)  rivaroxaban (XARELTO) tablet 20 mg (20 mg Oral Given 02/02/20 1250)  metoprolol tartrate (LOPRESSOR) tablet 25 mg (25 mg Oral Given 02/02/20 1204)    ED Course  I have reviewed the triage vital signs and the nursing notes.  Pertinent labs & imaging results that were available during my care of the patient were reviewed by me and considered in my medical decision making (see chart for details).  Clinical Course as of Feb 01 1349  Thu Feb 02, 2020  1100 Spoke with Misty Stanley, answering for Dr. Jacinto Halim. She communicated the patient's symptoms and presentation to Dr. Jacinto Halim.  He states he is in the process of finishing up a cardiac cath and will call back.  No treatment until then, unless patient becomes unstable.   [SJ]  1131 Spoke with Dr. Jacinto Halim, cardiologist. We discussed the patient's presentation, timing of symptoms, and physical exam findings. He recommends setting up for cardioversion here in the ED.  Requests 20 mg Xarelto p.o. and 5 mg metoprolol IV now and set up for procedural sedation and cardioversion.   [SJ]  1139 Went back into the patient's room to  discuss the proposed medication therapies as well as the proposed procedures.  During this discussion, patient's heart rate dropped to around 104 and looked to be in sinus rhythm.   [SJ]  1157 Called Dr. Jacinto Halim back and made him aware of the change in patient's rhythm. He states patient can still receive 20 mg Xarelto p.o. and be started on this medication 20 mg once daily.He can now have 25 mg metoprolol p.o. and be started on 25 mg metoprolol twice daily. Patient can be discharged and follow-up in the office.   [SJ]  1237 Poor waveform.  SpO2(!): 85 % [SJ]    Clinical Course User Index [SJ] Hendryx Ricke C, PA-C   MDM Rules/Calculators/A&P     CHA2DS2-VASc Score: 4                     Patient presents with rapid heart rate. Patient is nontoxic appearing, afebrile, not hypotensive, maintains excellent SPO2 on room air, and is in no apparent distress.  During vagal maneuver, patient's rate slowed enough to identify the tachycardic rhythm as A. fib RVR, but rate then returned to its previous level.  I have reviewed the patient's chart to  obtain more information.   I reviewed and interpreted the patient's labs and radiological studies.  It appears as though patient presents with new onset A. fib.  We will anticoagulate with Xarelto, restart metoprolol, and have patient follow-up in the office. The patient was given instructions for home care as well as return precautions. Patient voices understanding of these instructions, accepts the plan, and is comfortable with discharge.   Findings and plan of care discussed with Cherlynn Perches, MD. Dr. Myrtis Ser personally evaluated and examined this patient.  Vitals:   02/02/20 1234 02/02/20 1251 02/02/20 1315 02/02/20 1330  BP:  125/82 101/64   Pulse: 88 100  80  Resp: (!) 27 16 (!) 24   Temp:      SpO2: (!) 85% 98%    Weight:      Height:         Final Clinical Impression(s) / ED Diagnoses Final diagnoses:  New onset atrial fibrillation (HCC)     Rx / DC Orders ED Discharge Orders         Ordered    rivaroxaban (XARELTO) 20 MG TABS tablet  Daily with supper     Discontinue  Reprint     02/02/20 1328    metoprolol tartrate (LOPRESSOR) 25 MG tablet  2 times daily     Discontinue  Reprint     02/02/20 1328           Anselm Pancoast, PA-C 02/02/20 1351    Sabino Donovan, MD 02/02/20 [id number]

## 2020-02-02 NOTE — ED Notes (Signed)
Paged cards per PA  

## 2020-02-02 NOTE — ED Triage Notes (Signed)
Pt from home with ems, felt like his heart was racing when he went to take a shower. HR 150-160 with ems. No chest pain or dizziness. EKG- a flutter/ rapid a fib BP 112/64 20R hand  Pt a.o, nad noted

## 2020-02-03 NOTE — Telephone Encounter (Signed)
From patient.

## 2020-02-07 ENCOUNTER — Telehealth (HOSPITAL_COMMUNITY): Payer: Self-pay | Admitting: Nurse Practitioner

## 2020-02-07 ENCOUNTER — Telehealth: Payer: Self-pay

## 2020-02-07 ENCOUNTER — Other Ambulatory Visit: Payer: Self-pay

## 2020-02-07 MED ORDER — DILTIAZEM HCL ER COATED BEADS 120 MG PO CP24
120.0000 mg | ORAL_CAPSULE | Freq: Every day | ORAL | 1 refills | Status: DC
Start: 2020-02-07 — End: 2020-02-10

## 2020-02-07 NOTE — Telephone Encounter (Signed)
Hold off diltiazem for now. Will see him in person on 7/9.  Thanks MJP

## 2020-02-07 NOTE — Telephone Encounter (Signed)
I am sorry he is having gout flare again. Let us switch to diltiazem 120 mg daily 30 pills X 1 refill. D/C metoprolol.   Staff, When is his appt with me? Sooner the better, today fine too if can be before my cath  Thanks MJP

## 2020-02-07 NOTE — Telephone Encounter (Signed)
I already send in the new rx. So im done with that part. Can you see if he can come in sooner thanks

## 2020-02-07 NOTE — Telephone Encounter (Signed)
Please read from pt

## 2020-02-07 NOTE — Telephone Encounter (Signed)
Pt wife called to inform us that the pharmacist told her that the Diltiazem interferes with the colchicine and that the side effect will give him muscle aches. Pt wife mention he is already in pain due to the gout. Pt wife would like to see if you could give him something else

## 2020-02-08 ENCOUNTER — Encounter (HOSPITAL_COMMUNITY): Payer: BC Managed Care – PPO

## 2020-02-10 ENCOUNTER — Ambulatory Visit: Payer: BC Managed Care – PPO | Admitting: Cardiology

## 2020-02-10 ENCOUNTER — Other Ambulatory Visit: Payer: Self-pay

## 2020-02-10 ENCOUNTER — Ambulatory Visit: Payer: BC Managed Care – PPO

## 2020-02-10 ENCOUNTER — Encounter (HOSPITAL_COMMUNITY): Payer: BC Managed Care – PPO

## 2020-02-10 ENCOUNTER — Encounter: Payer: Self-pay | Admitting: Cardiology

## 2020-02-10 VITALS — BP 114/78 | HR 91 | Ht 69.0 in | Wt 222.2 lb

## 2020-02-10 DIAGNOSIS — I480 Paroxysmal atrial fibrillation: Secondary | ICD-10-CM

## 2020-02-10 DIAGNOSIS — R0683 Snoring: Secondary | ICD-10-CM | POA: Diagnosis not present

## 2020-02-10 DIAGNOSIS — I482 Chronic atrial fibrillation, unspecified: Secondary | ICD-10-CM | POA: Diagnosis not present

## 2020-02-10 MED ORDER — DILTIAZEM HCL 30 MG PO TABS: 30.0000 mg | ORAL_TABLET | Freq: Four times a day (QID) | ORAL | 2 refills | Status: AC | PRN

## 2020-02-10 NOTE — Progress Notes (Signed)
Follow up visit  Subjective:   Sean Flynn, male    DOB: Jun 16, 1966, 54 y.o.   MRN: [id number]    Chief Complaint  Patient presents with  . Atrial Fibrillation  . Hospitalization Follow-up     54 y.o. Caucasian male with hypertension, hyperlipidemia, CAD s/p CABGX2 (LIMA-LAD, RIMA-RCA 10/2019), PAF (RVR episode 02/2019),  PAD, former smoker, h/o EtOH overuse, PAD, gout, migraine.  Patient was recently in [facility] emergency department on 02/02/2020 with complaints of palpitations.  He was found to be A. fib with RVR.  He self converted while in the ED.  He was discharged home on Xarelto 20 mg daily and metoprolol tartrate 25 mg twice daily.  Unfortunately, with resumption of metoprolol, his gout returned with severe pain and inflammation in right knee and right ankle.  Diltiazem was suggested as an alternate option.  However, patient is concerned regarding concurrent use of diltiazem and colchicine due to interaction.  Patient has had one episode of palpitations since 02/02/2020.  However, this was much lesser in severity and intensity compared to episode on 02/02/2020.  Current Outpatient Medications on File Prior to Visit  Medication Sig Dispense Refill  . allopurinol (ZYLOPRIM) 100 MG tablet Take 200 mg by mouth daily.     Marland Kitchen atorvastatin (LIPITOR) 40 MG tablet TAKE 1 TABLET (40 MG TOTAL) BY MOUTH DAILY AT 6 PM. 90 tablet 1  . clopidogrel (PLAVIX) 75 MG tablet Take 1 tablet (75 mg total) by mouth daily. 30 tablet 3  . colchicine 0.6 MG tablet Take 0.6 mg by mouth 2 (two) times daily.     Marland Kitchen lisinopril (ZESTRIL) 10 MG tablet Take 1 tablet (10 mg total) by mouth daily. (Patient taking differently: Take 5 mg by mouth daily. ) 30 tablet 1  . Multiple Vitamin (MULTIVITAMIN) tablet Take 1 tablet by mouth daily.     . Omega-3 Fatty Acids (OMEGA-3 FISH OIL) 1200 MG CAPS Take 2,400 mg by mouth at bedtime.     . pantoprazole (PROTONIX) 40 MG tablet Take 1 tablet (40 mg total) by mouth daily.  (Patient taking differently: Take 40 mg by mouth daily as needed (acid reflux). ) 60 tablet 2  . predniSONE (DELTASONE) 5 MG tablet Take 5 mg by mouth daily with breakfast.    . psyllium (METAMUCIL) 58.6 % packet Take 1 packet by mouth daily.    . rivaroxaban (XARELTO) 20 MG TABS tablet Take 1 tablet (20 mg total) by mouth daily with supper. 30 tablet 0  . zolpidem (AMBIEN) 5 MG tablet Take 2.5-5 mg by mouth at bedtime as needed for sleep.     Marland Kitchen diltiazem (CARDIZEM CD) 120 MG 24 hr capsule Take 1 capsule (120 mg total) by mouth daily. (Patient not taking: Reported on 02/10/2020) 30 capsule 1   No current facility-administered medications on file prior to visit.    Cardiovascular & other pertient studies:  EKG 02/10/2020: Sinus rhythm 81 bpm Normal EKG  EKG 02/02/2020: A. fib with RVR 160 bpm.  Diffuse ST-T changes, consider ischemia  EKG 12/23/2019:  Sinus rhythm 69 bpm  Nonspecific T-abnormality inferior leads  MRI brain 12/16/2019: 1. No acute intracranial abnormality. 2. Advanced atrophy and findings of chronic small vessel disease.  Op note 11/07/2019 (Dr. Roxan Hockey): 1.  Median sternotomy.  2.  Extracorporeal circulation 3.  Coronary artery bypass grafting x 2      Left internal mammary artery to left anterior descending,      Free right internal mammary artery  to distal right coronary.  LEA duplex 11/04/2019: Patent distal abdominal aorta and bilateral iliac arteries without  evidence of significant stenosis.  Right: 75-99% stenosis noted in the superficial femoral artery. Posterior  tibial artery appears occluded.  Left: 75-99% stenosis noted in the superficial femoral artery. Posterior  tibial artery appears occluded.   Echocardiogram 11/04/2019: 1. Mild LVH. Normal LV size. Mild global hypokinesis. LVEF 45-50%. Normal  diastolic function.  2. Normal RV size and function.  3. No significant valvular abnormality.  4. Normal right atrial pressure.   Coronary angiogram  11/03/2019: LM: Normal LAD: Severe ostial to mid calcification with long 50-75% calcified lesion upto mid LAD. Distal vessel is spared of calcification, with excellent distal surgical target.  LCx: Mid calcific 50% stenosis. RCA: Severely calcified prox CTO with a narrow antegrade collateral, adequate to maintain TIMI III flow at rest, follwowed by log mid 60-80% long calcified lesion. Distal vessel is spared of calcification, with excellent distal surgical target.   LVEDP 20 mmHg   Recent labs: 02/02/2020: Glucose 174, BUN/Cr 7/0.95. EGFR >60. Na/K 133/4.3.  H/H 14/44. MCV 85. Platelets 229  12/15/2019: Glucose 112, BUN/Cr 10/0.85. EGFR 60. Na/K 135/4.8. Chloride 96. Rest of the CMP normal H/H 14/42. Platelets 294 Chol 144, TG 317, HDL 54, LDL 27  11/10/2019: Glucose 122, BUN/Cr 17/1.09. EGFR >60. Na/K 136/3.9. Rest of the CMP normal H/H 10/30. MCV 95. Platelets 242 HbA1C 5.5% Chol 144, TG 317, HDL 54, LDL 27 Lipoprotein (a) 213, elevated   Review of Systems  Cardiovascular: Positive for claudication. Negative for chest pain, dyspnea on exertion, leg swelling, palpitations and syncope.       Vitals:   02/10/20 1434  BP: 114/78  Pulse: 91  SpO2: 96%     Body mass index is 32.81 kg/m. Filed Weights   02/10/20 1434  Weight: 222 lb 3.2 oz (100.8 kg)     Objective:   Physical Exam Vitals and nursing note reviewed.  Constitutional:      Appearance: He is well-developed.  Neck:     Vascular: No JVD.  Cardiovascular:     Rate and Rhythm: Normal rate and regular rhythm.     Pulses:          Femoral pulses are 2+ on the right side and 2+ on the left side.      Popliteal pulses are 0 on the right side and 1+ on the left side.       Dorsalis pedis pulses are 0 on the right side and 0 on the left side.       Posterior tibial pulses are 0 on the right side and 0 on the left side.     Heart sounds: Normal heart sounds. No murmur heard.   Pulmonary:     Effort: Pulmonary  effort is normal.     Breath sounds: Normal breath sounds. No wheezing or rales.         Assessment & Recommendations:   54 y.o. Caucasian male with hypertension, hyperlipidemia, CAD s/p CABGX2 (LIMA-LAD, RIMA-RCA 10/2019), PAF (RVR episode 02/2019),  PAD, former smoker, h/o EtOH overuse, PAD, gout, migraine.  PAF: RVR episode on 02/02/2020 with self conversion.  Sinus rhythm. CHA2DS2VASc score 3, annual stroke risk 3% Continue Xarelto 20 mg daily. Unable to use metoprolol due to severe recurrence of cough.  Her potassium does not drop with colchicine, it increases colchicine risk.  Less likely to precipitate gout episodes.  Recommend as needed diltiazem 30 mg every 6  hours as needed.  Recommend reducing colchicine dose to once a day on days he does take diltiazem. Placed on 2-week event monitor to assess A. Fib. If continues to have frequent and severe episodes of A. fib, will consider rhythm control therapy with antiarrhythmic medications or ablation.  Stenosis.  Medications include sotalol, dronedarone, or dofetilide.  CAD: S/p CABGX2 (LIMA-LAD, RIMA-RCA). Continue lisinopril 5 mg daily, lipitor 80 mg daily.  Not ob beta blocker due severe gout attacks.   In light of CAD, and PAD, continue Plavix 75 mg daily.  No ongoing bleeding issues.  ehab  PAD:  Non-lifestyle limiting b/l calf claudication. Suspect b/l SFA stenoses. Continue medical management.  Continue Plavix 75 mg daily. Continue statin. Encourage regular walking.   Elevated lipoprotein (a): Conitnue statin for now. Will be eligible enroll in [employer] trial in July. He wants to wait until stabilization of his A. fib.  F/u in 03/2020   Nigel Mormon, MD [facility] [place] Pager: [phone] Office: [phone]

## 2020-02-12 ENCOUNTER — Other Ambulatory Visit: Payer: Self-pay | Admitting: Cardiology

## 2020-02-13 ENCOUNTER — Encounter (HOSPITAL_COMMUNITY): Payer: BC Managed Care – PPO

## 2020-02-13 ENCOUNTER — Telehealth (HOSPITAL_COMMUNITY): Payer: Self-pay | Admitting: *Deleted

## 2020-02-15 ENCOUNTER — Telehealth (HOSPITAL_COMMUNITY): Payer: Self-pay | Admitting: Nurse Practitioner

## 2020-02-15 ENCOUNTER — Encounter (HOSPITAL_COMMUNITY): Payer: BC Managed Care – PPO

## 2020-02-15 DIAGNOSIS — M109 Gout, unspecified: Secondary | ICD-10-CM | POA: Diagnosis not present

## 2020-02-17 ENCOUNTER — Encounter (HOSPITAL_COMMUNITY)
Admission: RE | Admit: 2020-02-17 | Discharge: 2020-02-17 | Disposition: A | Discharge status: Home / Self Care | Payer: BC Managed Care – PPO | Source: Ambulatory Visit | Attending: Cardiology | Admitting: Cardiology

## 2020-02-17 ENCOUNTER — Other Ambulatory Visit: Payer: Self-pay

## 2020-02-17 ENCOUNTER — Telehealth (HOSPITAL_COMMUNITY): Payer: Self-pay | Admitting: *Deleted

## 2020-02-17 ENCOUNTER — Ambulatory Visit: Payer: BC Managed Care – PPO | Admitting: Cardiology

## 2020-02-17 DIAGNOSIS — I2111 ST elevation (STEMI) myocardial infarction involving right coronary artery: Secondary | ICD-10-CM

## 2020-02-17 DIAGNOSIS — Z951 Presence of aortocoronary bypass graft: Secondary | ICD-10-CM | POA: Insufficient documentation

## 2020-02-17 NOTE — Telephone Encounter (Signed)
Spoke with [employer]. Patient notified he may start exercise on Monday per Dr Rosemary Holms.[name], RN,BSN 02/17/2020 3:14 PM

## 2020-02-17 NOTE — Progress Notes (Signed)
Incomplete Session Note  Patient Details  Name: Sean Flynn MRN: [id number] Date of Birth: July 22, 1966 Referring Provider:     CARDIAC REHAB PHASE II ORIENTATION from 01/31/2020 in [facility] CARDIAC [facility]  Referring Provider [name],  Dr. Wannetta Sender did not complete his rehab session.  Patient presented to exercise at cardiac rehab. Will check with Dr Rosemary Holms before proceeding.[name], RN,BSN 02/17/2020 8:51 AM

## 2020-02-17 NOTE — Telephone Encounter (Signed)
-----   Message from [facility], MD sent at 02/17/2020  1:51 PM EDT ----- Regarding: RE: cardiac rehab Okay to start as long as gout not a limitation to him.  Thanks MJP  ----- Message ----- From: Cammy Copa, RN Sent: 02/17/2020   8:53 AM EDT To: Elder Negus, MD Subject: cardiac rehab                                  Good morning Dr Rosemary Holms, Are you okay with Mr Sean Flynn proceeding with exercise at cardiac rehab.. Mr Sean Flynn has not started exercise since his Atrial Fib and gout.   Thanks for your input.  Sincerely, [facility]  Cardiac rehab

## 2020-02-20 ENCOUNTER — Encounter (HOSPITAL_COMMUNITY)
Admission: RE | Admit: 2020-02-20 | Discharge: 2020-02-20 | Disposition: A | Discharge status: Home / Self Care | Payer: BC Managed Care – PPO | Source: Ambulatory Visit | Attending: Cardiology | Admitting: Cardiology

## 2020-02-20 ENCOUNTER — Other Ambulatory Visit: Payer: Self-pay

## 2020-02-20 DIAGNOSIS — I2111 ST elevation (STEMI) myocardial infarction involving right coronary artery: Secondary | ICD-10-CM

## 2020-02-20 DIAGNOSIS — Z951 Presence of aortocoronary bypass graft: Secondary | ICD-10-CM | POA: Diagnosis not present

## 2020-02-20 NOTE — Progress Notes (Signed)
Daily Session Note  Patient Details  Name: Sean Flynn MRN: [id number] Date of Birth: 1965-11-25 Referring Provider:     [place] from 01/31/2020 in [place]  Referring Provider [name],  Dr. Joya Gaskins      Encounter Date: 02/20/2020  Check In:  Session Check In - 02/20/20 0855      Check-In   Supervising physician immediately available to respond to emergencies [facility] immediately available    Physician(s) Dr. Pietro Cassis    Location [facility]    Staff Present Lesly Rubenstein, MS, EP-C, CCRP;Azaya Goedde, RN, Isaac Laud, MS, ACSM-CEP, Exercise Physiologist;Tyara Carol Ada, MS,ACSM CEP, Exercise Physiologist;Olinty Celesta Aver, MS, ACSM CEP, Exercise Physiologist    Virtual Visit No    Medication changes reported     No    Fall or balance concerns reported    No    Tobacco Cessation No Change    Warm-up and Cool-down Performed on first and last piece of equipment    Resistance Training Performed Yes    VAD Patient? No    PAD/SET Patient? No      Pain Assessment   Currently in Pain? No/denies    Pain Score 0-No pain    Multiple Pain Sites No           Capillary Blood Glucose: No results found for this or any previous visit (from the past 24 hour(s)).   Exercise Prescription Changes - 02/20/20 1000      Response to Exercise   Blood Pressure (Admit) 128/76    Blood Pressure (Exercise) 128/70    Blood Pressure (Exit) 120/80    Heart Rate (Admit) 100 bpm    Heart Rate (Exercise) 103 bpm    Heart Rate (Exit) 99 bpm    Rating of Perceived Exertion (Exercise) 12    Perceived Dyspnea (Exercise) 0    Symptoms None    Comments Pt's first day of exercise     Duration Progress to 30 minutes of  aerobic without signs/symptoms of physical distress    Intensity THRR unchanged      Progression   Progression Continue to progress workloads to maintain intensity without signs/symptoms of  physical distress.    Average METs 2.2      Resistance Training   Training Prescription Yes    Weight 5lbs    Reps 10-15    Time 10 Minutes      Interval Training   Interval Training No      Recumbant Bike   Level 2    RPM 50    Minutes 15    METs 2.2      Arm Ergometer   Level 2    Watts 4    Minutes 15           Social History   Tobacco Use  Smoking Status Former Smoker  . Packs/day: 1.00  . Years: 25.00  . Pack years: 25.00  . Types: Cigarettes  . Quit date: 2010  . Years since quitting: 11.5  Smokeless Tobacco Never Used    Goals Met:  Exercise tolerated well No report of cardiac concerns or symptoms Strength training completed today  Goals Unmet:  Not Applicable  Comments: Sean Flynn started cardiac rehab today.  Pt tolerated light exercise without difficulty. VSS, telemetry-Sinus Rhythm, asymptomatic.  Medication list reconciled. Pt denies barriers to medicaiton compliance.  PSYCHOSOCIAL ASSESSMENT:  PHQ-0. Pt exhibits positive coping skills, hopeful outlook with supportive family. No psychosocial  needs identified at this time, no psychosocial interventions necessary.    Pt enjoys woodworking and exercise.   Pt oriented to exercise equipment and routine.    Understanding verbalized. Sean Flynn says that he is taking a prednisone dose pack and that his gout is much better.Barnet Pall, RN,BSN 02/20/2020 4:22 PM   Dr. Fransico Him is Medical Director for Cardiac Rehab at [facility].

## 2020-02-22 ENCOUNTER — Other Ambulatory Visit: Payer: Self-pay

## 2020-02-22 ENCOUNTER — Encounter (HOSPITAL_COMMUNITY): Payer: BC Managed Care – PPO

## 2020-02-22 ENCOUNTER — Encounter (HOSPITAL_COMMUNITY)
Admission: RE | Admit: 2020-02-22 | Discharge: 2020-02-22 | Disposition: A | Discharge status: Home / Self Care | Payer: BC Managed Care – PPO | Source: Ambulatory Visit | Attending: Cardiology | Admitting: Cardiology

## 2020-02-22 DIAGNOSIS — Z951 Presence of aortocoronary bypass graft: Secondary | ICD-10-CM

## 2020-02-22 DIAGNOSIS — I2111 ST elevation (STEMI) myocardial infarction involving right coronary artery: Secondary | ICD-10-CM

## 2020-02-23 ENCOUNTER — Ambulatory Visit: Payer: BC Managed Care – PPO | Admitting: Cardiology

## 2020-02-23 NOTE — Progress Notes (Signed)
Cardiac Individual Treatment Plan  Patient Details  Name: Sean Flynn MRN: [id number] Date of Birth: 1966-05-16 Referring Provider:     CARDIAC REHAB PHASE [facility]  Referring Provider [name],  Dr. 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  allopurinol (ZYLOPRIM) 100 MG tablet, Take 200 mg by mouth daily. , Disp: , Rfl:  .  atorvastatin (LIPITOR) 40 MG tablet, TAKE 1 TABLET (40 MG TOTAL) BY MOUTH DAILY AT 6 PM., Disp: 90 tablet, Rfl: 1 .  clopidogrel (PLAVIX) 75 MG tablet, Take 1 tablet (75 mg total) by mouth daily., Disp: 30 tablet, Rfl: 3 .  colchicine 0.6 MG tablet, Take 0.6 mg by mouth 2 (two) times daily. , Disp: , Rfl:  .  diltiazem (CARDIZEM) 30 MG tablet, Take 1 tablet (30 mg total) by mouth every 6 (six) hours as needed., Disp: 30 tablet, Rfl: 2 .  lisinopril (ZESTRIL) 10 MG tablet, TAKE 1 TABLET BY MOUTH EVERY DAY, Disp: 30 tablet, Rfl: 1 .  Multiple Vitamin (MULTIVITAMIN) tablet, Take 1 tablet by mouth daily. , Disp: , Rfl:  .  Omega-3 Fatty Acids (OMEGA-3 FISH OIL) 1200 MG CAPS, Take 2,400 mg by mouth at bedtime. , Disp: , Rfl:  .  pantoprazole (PROTONIX) 40 MG tablet, Take 1 tablet (40 mg total) by mouth daily. (Patient taking differently: Take 40 mg by mouth daily as needed (acid reflux). ), Disp: 60 tablet, Rfl: 2 .  predniSONE (DELTASONE) 5 MG tablet, Take 5 mg by mouth daily with breakfast., Disp: , Rfl:  .  psyllium (METAMUCIL) 58.6 % packet, Take 1 packet by mouth daily., Disp: , Rfl:  .  rivaroxaban (XARELTO) 20 MG TABS tablet, Take 1 tablet (20 mg total) by mouth daily with supper.,  Disp: 30 tablet, Rfl: 0 .  zolpidem (AMBIEN) 5 MG tablet, Take 2.5-5 mg by mouth at bedtime as needed for sleep. , Disp: , Rfl:   Past Medical History: Past Medical History:  Diagnosis Date  . Anxiety   . Coronary artery disease   . Former smoker   . Hypertension   . Mixed hyperlipidemia   . TIA (transient ischemic attack)     Tobacco Use: Social History   Tobacco Use  Smoking Status Former Smoker  . Packs/day: 1.00  . Years: 25.00  . Pack years: 25.00  . Types: Cigarettes  . Quit date: 2010  . Years since quitting: 11.5  Smokeless Tobacco Never Used    Labs: Recent [facility] Flowsheet Data    Labs for ITP Cardiac and Pulmonary Rehab Latest Ref Rng & Units 11/07/2019 11/07/2019 11/07/2019 11/07/2019 11/07/2019   Cholestrol 0 - 200 mg/dL - - - - -   LDLCALC 0 - 99 mg/dL - - - - -   HDL >19>40 mg/dL - - - - -   Trlycerides <150 mg/dL - - - - -   Hemoglobin A1c 4.8 - 5.6 % - - - - -   PHART 7.35 - 7.45 - 7.308(L) - 7.300(L) 7.388   PCO2ART 32 - 48 mmHg - 48.7(H) - 49.3(H) 37.6   HCO3 20.0 - 28.0 mmol/L - 24.4 -  24.2 22.7   TCO2 22 - 32 mmol/L 27 26 25 26 24    ACIDBASEDEF 0.0 - 2.0 mmol/L - 2.0 - 3.0(H) 2.0   O2SAT % - 95.0 - 95.0 97.0      Capillary Blood Glucose: Lab Results  Component Value Date   GLUCAP 133 (H) 11/09/2019   GLUCAP 118 (H) 11/09/2019   GLUCAP 118 (H) 11/08/2019   GLUCAP 139 (H) 11/08/2019   GLUCAP 140 (H) 11/08/2019     Exercise Target Goals: Exercise Program Goal: Individual exercise prescription set using results from initial 6 min walk test and THRR while considering  patient's activity barriers and safety.   Exercise Prescription Goal: Starting with aerobic activity 30 plus minutes a day, 3 days per week for initial exercise prescription. Provide home exercise prescription and guidelines that participant acknowledges understanding prior to discharge.  Activity Barriers & Risk Stratification:  Activity Barriers & Cardiac Risk Stratification -  01/31/20 1303      Activity Barriers & Cardiac Risk Stratification   Activity Barriers Other (comment)    Comments dizziness    Cardiac Risk Stratification High           6 Minute Walk:  6 Minute Walk    Row Name 01/31/20 1038         6 Minute Walk   Phase Initial     Distance 1411 feet     Walk Time 6 minutes     # of Rest Breaks 0     MPH 2.67     METS 3.45     RPE 12     Perceived Dyspnea  1     VO2 Peak 12.08     Symptoms Yes (comment)     Comments RPD = 1     Resting HR 94 bpm     Resting BP 104/68     Resting Oxygen Saturation  97 %     Exercise Oxygen Saturation  during 6 min walk 99 %     Max Ex. HR 110 bpm     Max Ex. BP 114/68     2 Minute Post BP 100/60            Oxygen Initial Assessment:   Oxygen Re-Evaluation:   Oxygen Discharge (Final Oxygen Re-Evaluation):   Initial Exercise Prescription:  Initial Exercise Prescription - 01/31/20 1300      Date of Initial Exercise RX and Referring Provider   Date 01/31/20    Referring Provider [name],  Dr. 02/02/20    Expected Discharge Date 03/30/20      Recumbant Bike   Level 2    RPM 60    Minutes 15    METs 3.5      Arm Ergometer   Level 2    Watts 5    Minutes 15      Prescription Details   Frequency (times per week) 3    Duration Progress to 30 minutes of continuous aerobic without signs/symptoms of physical distress      Intensity   THRR 40-80% of Max Heartrate 66-132    Ratings of Perceived Exertion 11-13    Perceived Dyspnea 0-4      Progression   Progression Continue progressive overload as per policy without signs/symptoms or physical distress.      Resistance Training   Training Prescription Yes    Weight 5    Reps 10-15           Perform Capillary Blood Glucose checks  as needed.  Exercise Prescription Changes:  Exercise Prescription Changes    Row Name 02/20/20 1000             Response to Exercise   Blood Pressure (Admit) 128/76       Blood Pressure  (Exercise) 128/70       Blood Pressure (Exit) 120/80       Heart Rate (Admit) 100 bpm       Heart Rate (Exercise) 103 bpm       Heart Rate (Exit) 99 bpm       Rating of Perceived Exertion (Exercise) 12       Perceived Dyspnea (Exercise) 0       Symptoms None       Comments Pt's first day of exercise        Duration Progress to 30 minutes of  aerobic without signs/symptoms of physical distress       Intensity THRR unchanged         Progression   Progression Continue to progress workloads to maintain intensity without signs/symptoms of physical distress.       Average METs 2.2         Resistance Training   Training Prescription Yes       Weight 5lbs       Reps 10-15       Time 10 Minutes         Interval Training   Interval Training No         Recumbant Bike   Level 2       RPM 50       Minutes 15       METs 2.2         Arm Ergometer   Level 2       Watts 4       Minutes 15              Exercise Comments:  Exercise Comments    Row Name 02/20/20 1053           Exercise Comments Pt's first day of exercise. Pt oriented to exercise equipment. Responded well to workloads. Will continue to monitor and progress as tolerated.              Exercise Goals and Review:  Exercise Goals    Row Name 01/31/20 1307             Exercise Goals   Increase Physical Activity Yes       Intervention Provide advice, education, support and counseling about physical activity/exercise needs.;Develop an individualized exercise prescription for aerobic and resistive training based on initial evaluation findings, risk stratification, comorbidities and participant's personal goals.       Expected Outcomes Short Term: Attend rehab on a regular basis to increase amount of physical activity.;Long Term: Add in home exercise to make exercise part of routine and to increase amount of physical activity.;Long Term: Exercising regularly at least 3-5 days a week.       Increase Strength and  Stamina Yes       Intervention Provide advice, education, support and counseling about physical activity/exercise needs.;Develop an individualized exercise prescription for aerobic and resistive training based on initial evaluation findings, risk stratification, comorbidities and participant's personal goals.       Expected Outcomes Short Term: Increase workloads from initial exercise prescription for resistance, speed, and METs.;Short Term: Perform resistance training exercises routinely during rehab and add in resistance training at home;Long Term: Improve  cardiorespiratory fitness, muscular endurance and strength as measured by increased METs and functional capacity ( )       Able to understand and use rate of perceived exertion (RPE) scale Yes       Intervention Provide education and explanation on how to use RPE scale       Expected Outcomes Short Term: Able to use RPE daily in rehab to express subjective intensity level;Long Term:  Able to use RPE to guide intensity level when exercising independently       Knowledge and understanding of Target Heart Rate Range (THRR) Yes       Intervention Provide education and explanation of THRR including how the numbers were predicted and where they are located for reference       Expected Outcomes Short Term: Able to state/look up THRR;Short Term: Able to use daily as guideline for intensity in rehab;Long Term: Able to use THRR to govern intensity when exercising independently       Able to check pulse independently Yes       Intervention Provide education and demonstration on how to check pulse in carotid and radial arteries.;Review the importance of being able to check your own pulse for safety during independent exercise       Expected Outcomes Short Term: Able to explain why pulse checking is important during independent exercise;Long Term: Able to check pulse independently and accurately       Understanding of Exercise Prescription Yes        Intervention Provide education, explanation, and written materials on patient's individual exercise prescription       Expected Outcomes Short Term: Able to explain program exercise prescription;Long Term: Able to explain home exercise prescription to exercise independently              Exercise Goals Re-Evaluation :  Exercise Goals Re-Evaluation    Row Name 02/20/20 1053             Exercise Goal Re-Evaluation   Exercise Goals Review Increase Physical Activity;Increase Strength and Stamina       Comments Pt's first day of exercise. Pt responded well to exercise prescription. Able to exercise 30 minutes with no difficulty. Will continue to monitor.       Expected Outcomes Pt will continue to increase strength, stamina and cardiovascular fitness.               Discharge Exercise Prescription (Final Exercise Prescription Changes):  Exercise Prescription Changes - 02/20/20 1000      Response to Exercise   Blood Pressure (Admit) 128/76    Blood Pressure (Exercise) 128/70    Blood Pressure (Exit) 120/80    Heart Rate (Admit) 100 bpm    Heart Rate (Exercise) 103 bpm    Heart Rate (Exit) 99 bpm    Rating of Perceived Exertion (Exercise) 12    Perceived Dyspnea (Exercise) 0    Symptoms None    Comments Pt's first day of exercise     Duration Progress to 30 minutes of  aerobic without signs/symptoms of physical distress    Intensity THRR unchanged      Progression   Progression Continue to progress workloads to maintain intensity without signs/symptoms of physical distress.    Average METs 2.2      Resistance Training   Training Prescription Yes    Weight 5lbs    Reps 10-15    Time 10 Minutes      Interval Training   Interval Training No  Recumbant Bike   Level 2    RPM 50    Minutes 15    METs 2.2      Arm Ergometer   Level 2    Watts 4    Minutes 15           Nutrition:  Target Goals: Understanding of nutrition guidelines, daily intake of sodium  1500mg , cholesterol 200mg , calories 30% from fat and 7% or less from saturated fats, daily to have 5 or more servings of fruits and vegetables.  Biometrics:  Pre Biometrics - 01/31/20 1234      Pre Biometrics   Waist Circumference 44 inches    Hip Circumference 42.5 inches    Waist to Hip Ratio 1.04 %    Triceps Skinfold 8 mm    % Body Fat 28.8 %    Grip Strength 49 kg    Flexibility 13.5 in    Single Leg Stand 54 seconds            Nutrition Therapy Plan and Nutrition Goals:   Nutrition Assessments:   Nutrition Goals Re-Evaluation:   Nutrition Goals Discharge (Final Nutrition Goals Re-Evaluation):   Psychosocial: Target Goals: Acknowledge presence or absence of significant depression and/or stress, maximize coping skills, provide positive support system. Participant is able to verbalize types and ability to use techniques and skills needed for reducing stress and depression.  Initial Review & Psychosocial Screening:  Initial Psych Review & Screening - 01/31/20 1346      Initial Review   Current issues with None Identified      Family Dynamics   Good Support System? Yes   Sean Flynn has his wife for support     Barriers   Psychosocial barriers to participate in program There are no identifiable barriers or psychosocial needs.      Screening Interventions   Interventions Encouraged to exercise           Quality of Life Scores:  Quality of Life - 01/31/20 1247      Quality of Life Scores   Health/Function Pre 27.2 %    Socioeconomic Pre 28.31 %    Psych/Spiritual Pre 25.5 %    Family Pre 30 %    GLOBAL Pre 27.51 %          Scores of 19 and below usually indicate a poorer quality of life in these areas.  A difference of  2-3 points is a clinically meaningful difference.  A difference of 2-3 points in the total score of the Quality of Life Index has been associated with significant improvement in overall quality of life, self-image, physical symptoms, and  general health in studies assessing change in quality of life.  PHQ-9: Recent Review Flowsheet Data    Depression screen [facility] 2/9 01/31/2020   Decreased Interest 0   Down, Depressed, Hopeless 0   PHQ - 2 Score 0     Interpretation of Total Score  Total Score Depression Severity:  1-4 = Minimal depression, 5-9 = Mild depression, 10-14 = Moderate depression, 15-19 = Moderately severe depression, 20-27 = Severe depression   Psychosocial Evaluation and Intervention:   Psychosocial Re-Evaluation:  Psychosocial Re-Evaluation    Row Name 02/23/20 1454             Psychosocial Re-Evaluation   Current issues with None Identified       Comments Sean Flynn Started late due to gout but is feeling better       Interventions Encouraged to attend Cardiac  Rehabilitation for the exercise       Continue Psychosocial Services  No Follow up required              Psychosocial Discharge (Final Psychosocial Re-Evaluation):  Psychosocial Re-Evaluation - 02/23/20 1454      Psychosocial Re-Evaluation   Current issues with None Identified    Comments Sean Flynn Started late due to gout but is feeling better    Interventions Encouraged to attend Cardiac Rehabilitation for the exercise    Continue Psychosocial Services  No Follow up required           Vocational Rehabilitation: Provide vocational rehab assistance to qualifying candidates.   Vocational Rehab Evaluation & Intervention:  Vocational Rehab - 01/31/20 1349      Initial Vocational Rehab Evaluation & Intervention   Assessment shows need for Vocational Rehabilitation No   Sean Flynn is retired and does not need vocational rehab at this time          Education: Education Goals: Education classes will be provided on a weekly basis, covering required topics. Participant will state understanding/return demonstration of topics presented.  Learning Barriers/Preferences:  Learning Barriers/Preferences - 01/31/20 1256      Learning  Barriers/Preferences   Learning Barriers Sight   wears glasses   Learning Preferences Skilled Demonstration           Education Topics: Hypertension, Hypertension Reduction -Define heart disease and high blood pressure. Discus how high blood pressure affects the body and ways to reduce high blood pressure.   Exercise and Your Heart -Discuss why it is important to exercise, the FITT principles of exercise, normal and abnormal responses to exercise, and how to exercise safely.   Angina -Discuss definition of angina, causes of angina, treatment of angina, and how to decrease risk of having angina.   Cardiac Medications -Review what the following cardiac medications are used for, how they affect the body, and side effects that may occur when taking the medications.  Medications include Aspirin, Beta blockers, calcium channel blockers, ACE Inhibitors, angiotensin receptor blockers, diuretics, digoxin, and antihyperlipidemics.   Congestive Heart Failure -Discuss the definition of CHF, how to live with CHF, the signs and symptoms of CHF, and how keep track of weight and sodium intake.   Heart Disease and Intimacy -Discus the effect sexual activity has on the heart, how changes occur during intimacy as we age, and safety during sexual activity.   Smoking Cessation / COPD -Discuss different methods to quit smoking, the health benefits of quitting smoking, and the definition of COPD.   Nutrition I: Fats -Discuss the types of cholesterol, what cholesterol does to the heart, and how cholesterol levels can be controlled.   Nutrition II: Labels -Discuss the different components of food labels and how to read food label   Heart Parts/Heart Disease and PAD -Discuss the anatomy of the heart, the pathway of blood circulation through the heart, and these are affected by heart disease.   Stress I: Signs and Symptoms -Discuss the causes of stress, how stress may lead to anxiety and  depression, and ways to limit stress.   Stress II: Relaxation -Discuss different types of relaxation techniques to limit stress.   Warning Signs of Stroke / TIA -Discuss definition of a stroke, what the signs and symptoms are of a stroke, and how to identify when someone is having stroke.   Knowledge Questionnaire Score:  Knowledge Questionnaire Score - 01/31/20 1256      Knowledge Questionnaire Score   Pre  Score 21/24           Core Components/Risk Factors/Patient Goals at Admission:  Personal Goals and Risk Factors at Admission - 01/31/20 1258      Core Components/Risk Factors/Patient Goals on Admission    Weight Management Yes;Obesity;Weight Loss    Intervention Weight Management: Develop a combined nutrition and exercise program designed to reach desired caloric intake, while maintaining appropriate intake of nutrient and fiber, sodium and fats, and appropriate energy expenditure required for the weight goal.;Weight Management: Provide education and appropriate resources to help participant work on and attain dietary goals.;Weight Management/Obesity: Establish reasonable short term and long term weight goals.;Obesity: Provide education and appropriate resources to help participant work on and attain dietary goals.    Admit Weight 226 lb 6.6 oz (102.7 kg)    Goal Weight: Long Term 200 lb (90.7 kg)    Expected Outcomes Short Term: Continue to assess and modify interventions until short term weight is achieved;Long Term: Adherence to nutrition and physical activity/exercise program aimed toward attainment of established weight goal;Weight Loss: Understanding of general recommendations for a balanced deficit meal plan, which promotes 1-2 lb weight loss per week and includes a negative energy balance of [phone] kcal/d;Understanding recommendations for meals to include 15-35% energy as protein, 25-35% energy from fat, 35-60% energy from carbohydrates, less than 200mg  of dietary  cholesterol, 20-35 gm of total fiber daily;Understanding of distribution of calorie intake throughout the day with the consumption of 4-5 meals/snacks    Hypertension Yes    Intervention Provide education on lifestyle modifcations including regular physical activity/exercise, weight management, moderate sodium restriction and increased consumption of fresh fruit, vegetables, and low fat dairy, alcohol moderation, and smoking cessation.;Monitor prescription use compliance.    Expected Outcomes Short Term: Continued assessment and intervention until BP is < 140/44mm HG in hypertensive participants. < 130/18mm HG in hypertensive participants with diabetes, heart failure or chronic kidney disease.;Long Term: Maintenance of blood pressure at goal levels.    Lipids Yes    Intervention Provide education and support for participant on nutrition & aerobic/resistive exercise along with prescribed medications to achieve LDL 70mg , HDL >40mg .    Expected Outcomes Short Term: Participant states understanding of desired cholesterol values and is compliant with medications prescribed. Participant is following exercise prescription and nutrition guidelines.;Long Term: Cholesterol controlled with medications as prescribed, with individualized exercise RX and with personalized nutrition plan. Value goals: LDL < 70mg , HDL > 40 mg.           Core Components/Risk Factors/Patient Goals Review:   Goals and Risk Factor Review    Row Name 02/23/20 1455             Core Components/Risk Factors/Patient Goals Review   Personal Goals Review Weight Management/Obesity;Lipids;Hypertension       Review is off to a good start to exercise this week. Sean Flynn's vital signs have been stable. 02/25/20 is taking a prednisone pack for gout       Expected Outcomes Sean Flynn will continue to participate in phase 2 cardiac rehab for exercise, nutrtion and lifestyle modifications              Core Components/Risk Factors/Patient Goals at  Discharge (Final Review):   Goals and Risk Factor Review - 02/23/20 1455      Core Components/Risk Factors/Patient Goals Review   Personal Goals Review Weight Management/Obesity;Lipids;Hypertension    Review Sean Flynn is off to a good start to exercise this week. Sean Flynn's vital signs have been stable. 02/25/20  is taking a prednisone pack for gout    Expected Outcomes Sean Flynn will continue to participate in phase 2 cardiac rehab for exercise, nutrtion and lifestyle modifications           ITP Comments:  ITP Comments    Row Name 01/31/20 1329 02/23/20 1451         ITP Comments Dr Armanda Magic MD, Medical Director 30 Day ITP Review. Sean Flynn started exercise at cardiac rehab this week and is doing well with exercise.             Comments: See ITP comments. Sean Flynn started a week late due to a gout flare up and is feeling better now.[name], RN,BSN 02/23/2020 3:00 PM

## 2020-02-24 ENCOUNTER — Other Ambulatory Visit: Payer: Self-pay

## 2020-02-24 ENCOUNTER — Encounter (HOSPITAL_COMMUNITY): Payer: BC Managed Care – PPO

## 2020-02-24 ENCOUNTER — Encounter (HOSPITAL_COMMUNITY)
Admission: RE | Admit: 2020-02-24 | Discharge: 2020-02-24 | Disposition: A | Discharge status: Home / Self Care | Payer: BC Managed Care – PPO | Source: Ambulatory Visit | Attending: Cardiology | Admitting: Cardiology

## 2020-02-24 DIAGNOSIS — Z951 Presence of aortocoronary bypass graft: Secondary | ICD-10-CM

## 2020-02-24 DIAGNOSIS — I2111 ST elevation (STEMI) myocardial infarction involving right coronary artery: Secondary | ICD-10-CM | POA: Diagnosis not present

## 2020-02-27 ENCOUNTER — Other Ambulatory Visit: Payer: Self-pay

## 2020-02-27 ENCOUNTER — Encounter (HOSPITAL_COMMUNITY)
Admission: RE | Admit: 2020-02-27 | Discharge: 2020-02-27 | Disposition: A | Discharge status: Home / Self Care | Payer: BC Managed Care – PPO | Source: Ambulatory Visit | Attending: Cardiology | Admitting: Cardiology

## 2020-02-27 DIAGNOSIS — Z951 Presence of aortocoronary bypass graft: Secondary | ICD-10-CM

## 2020-02-27 DIAGNOSIS — I2111 ST elevation (STEMI) myocardial infarction involving right coronary artery: Secondary | ICD-10-CM

## 2020-02-29 ENCOUNTER — Encounter (HOSPITAL_COMMUNITY)
Admission: RE | Admit: 2020-02-29 | Discharge: 2020-02-29 | Disposition: A | Discharge status: Home / Self Care | Payer: BC Managed Care – PPO | Source: Ambulatory Visit | Attending: Cardiology | Admitting: Cardiology

## 2020-02-29 ENCOUNTER — Other Ambulatory Visit: Payer: Self-pay

## 2020-02-29 DIAGNOSIS — M9903 Segmental and somatic dysfunction of lumbar region: Secondary | ICD-10-CM | POA: Diagnosis not present

## 2020-02-29 DIAGNOSIS — Z951 Presence of aortocoronary bypass graft: Secondary | ICD-10-CM | POA: Diagnosis not present

## 2020-02-29 DIAGNOSIS — I2111 ST elevation (STEMI) myocardial infarction involving right coronary artery: Secondary | ICD-10-CM

## 2020-02-29 DIAGNOSIS — M5136 Other intervertebral disc degeneration, lumbar region: Secondary | ICD-10-CM | POA: Diagnosis not present

## 2020-03-01 ENCOUNTER — Other Ambulatory Visit: Payer: Self-pay

## 2020-03-01 MED ORDER — RIVAROXABAN 20 MG PO TABS: 20.0000 mg | ORAL_TABLET | Freq: Every day | ORAL | 0 refills | Status: DC

## 2020-03-02 ENCOUNTER — Other Ambulatory Visit: Payer: Self-pay

## 2020-03-02 ENCOUNTER — Encounter (HOSPITAL_COMMUNITY)
Admission: RE | Admit: 2020-03-02 | Discharge: 2020-03-02 | Disposition: A | Discharge status: Home / Self Care | Payer: BC Managed Care – PPO | Source: Ambulatory Visit | Attending: Cardiology | Admitting: Cardiology

## 2020-03-02 VITALS — Wt 222.0 lb

## 2020-03-02 DIAGNOSIS — Z951 Presence of aortocoronary bypass graft: Secondary | ICD-10-CM

## 2020-03-02 DIAGNOSIS — I2111 ST elevation (STEMI) myocardial infarction involving right coronary artery: Secondary | ICD-10-CM

## 2020-03-02 MED ORDER — LISINOPRIL 5 MG PO TABS: 5.0000 mg | ORAL_TABLET | Freq: Every day | ORAL | 1 refills | Status: DC

## 2020-03-02 NOTE — Progress Notes (Signed)
Sean Flynn has had some intermittent irregular rhythm noted non sustained rate 80's. Patient asymptomatic. Blood pressure 112/60. SA02 98% on room air. Sean Flynn has mostly been in sinus rhythm today. Today's ECG tracings sent to Dr Damian Leavell office for review. Medications reviewed. Sean Flynn has a prescription for Diltiazem to take as needed and has not had to use yet. Dr Odis Hollingshead reviewed the ECG tracings and thinks that [name] had some atrial fibrillation. Sean Flynn was instructed via telephone to take the diltiazem when he goes home today. Patient states understanding and left cardiac rehab without complaints or symptoms. [name] also asked Dr Damian Leavell office if they could call in a prescription for 5 mg of lisinopril as the 10 mg dose is had to break in half.[name], RN,BSN 03/02/2020 10:29 AM

## 2020-03-02 NOTE — Progress Notes (Signed)
Sean Flynn 54 y.o. 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 Anxiety   . Coronary artery disease   . Former smoker   . Hypertension   . Mixed hyperlipidemia   . TIA (transient ischemic attack)      Medications reviewed.   Current Outpatient Medications:  .  allopurinol (ZYLOPRIM) 100 MG tablet, Take 200 mg by mouth daily. , Disp: , Rfl:  .  atorvastatin (LIPITOR) 40 MG tablet, TAKE 1 TABLET (40 MG TOTAL) BY MOUTH DAILY AT 6 PM., Disp: 90 tablet, Rfl: 1 .  clopidogrel (PLAVIX) 75 MG tablet, Take 1 tablet (75 mg total) by mouth daily., Disp: 30 tablet, Rfl: 3 .  colchicine 0.6 MG tablet, Take 0.6 mg by mouth 2 (two) times daily. , Disp: , Rfl:  .  diltiazem (CARDIZEM) 30 MG tablet, Take 1 tablet (30 mg total) by mouth every 6 (six) hours as needed., Disp: 30 tablet, Rfl: 2 .  lisinopril (ZESTRIL) 10 MG tablet, TAKE 1 TABLET BY MOUTH EVERY DAY, Disp: 30 tablet, Rfl: 1 .  Multiple Vitamin (MULTIVITAMIN) tablet, Take 1 tablet by mouth daily. , Disp: , Rfl:  .  Omega-3 Fatty Acids (OMEGA-3 FISH OIL) 1200 MG CAPS, Take 2,400 mg by mouth at bedtime. , Disp: , Rfl:  .  pantoprazole (PROTONIX) 40 MG tablet, Take 1 tablet (40 mg total) by mouth daily. (Patient taking differently: Take 40 mg by mouth daily as needed (acid reflux). ), Disp: 60 tablet, Rfl: 2 .  predniSONE (DELTASONE) 5 MG tablet, Take 5 mg by mouth daily with breakfast., Disp: , Rfl:  .  psyllium (METAMUCIL) 58.6 % packet, Take 1 packet by mouth daily., Disp: , Rfl:  .  rivaroxaban (XARELTO) 20 MG TABS tablet, Take 1 tablet (20 mg total) by mouth daily with supper., Disp: 30 tablet, Rfl: 0 .  zolpidem (AMBIEN) 5 MG tablet, Take 2.5-5 mg by mouth at bedtime as needed for sleep. , Disp: , Rfl:    Ht Readings from Last 1 Encounters:  02/10/20 5\' 9"  (1.753 m)     Wt Readings from Last  3 Encounters:  02/10/20 222 lb 3.2 oz (100.8 kg)  02/02/20 220 lb (99.8 kg)  01/31/20 226 lb 6.6 oz (102.7 kg)     There is no height or weight on file to calculate BMI.   Social History   Tobacco Use  Smoking Status Former Smoker  . Packs/day: 1.00  . Years: 25.00  . Pack years: 25.00  . Types: Cigarettes  . Quit date: 2010  . Years since quitting: 11.5  Smokeless Tobacco Never Used     Lab Results  Component Value Date   CHOL 144 11/04/2019   Lab Results  Component Value Date   HDL 54 11/04/2019   Lab Results  Component Value Date   LDLCALC 27 11/04/2019   Lab Results  Component Value Date   TRIG 317 (H) 11/04/2019     Lab Results  Component Value Date   HGBA1C 5.5 11/03/2019     CBG (last 3)  No results for input(s): GLUCAP in the last 72 hours.   Nutrition Note  Spoke with pt. Nutrition Plan and Nutrition Survey goals reviewed with pt. Pt is following a Heart Healthy diet. Pt has lost 40 lbs since surgery. He wants to lose another 10-15 lbs. Pt has been trying  to lose wt by decreasing portions.   Pt reports hx of a generally healthy diet. They have started reading labels to reduce sodium and sugars since surgery. Avoids red meat d/t gout. No SSB. Eats out <1x/week.  Pt expressed understanding of the information reviewed.    Nutrition Diagnosis ? Obese  I = 30-34.9 related to excessive energy intake as evidenced by a 32.78 kg/m2  Nutrition Intervention ? Pt's individual nutrition plan reviewed with pt. ? Benefits of adopting Heart Healthy diet discussed when Medficts reviewed. ? Continue client-centered nutrition education by RD, as part of interdisciplinary care.  Goal(s) ? Pt to identify food quantities necessary to achieve weight loss of 6-24 lb at graduation from cardiac rehab.  ? Pt to build a healthy plate including vegetables, fruits, whole grains, and low-fat dairy products in a heart healthy meal plan.  Plan:   Will provide  client-centered nutrition education as part of interdisciplinary care  Monitor and evaluate progress toward nutrition goal with team.   Andrey Campanile, MS, RDN, LDN

## 2020-03-05 ENCOUNTER — Other Ambulatory Visit: Payer: Self-pay

## 2020-03-05 ENCOUNTER — Encounter (HOSPITAL_COMMUNITY)
Admission: RE | Admit: 2020-03-05 | Discharge: 2020-03-05 | Disposition: A | Discharge status: Home / Self Care | Payer: BC Managed Care – PPO | Source: Ambulatory Visit | Attending: Cardiology | Admitting: Cardiology

## 2020-03-05 DIAGNOSIS — I2111 ST elevation (STEMI) myocardial infarction involving right coronary artery: Secondary | ICD-10-CM

## 2020-03-05 DIAGNOSIS — Z951 Presence of aortocoronary bypass graft: Secondary | ICD-10-CM | POA: Insufficient documentation

## 2020-03-05 NOTE — Progress Notes (Addendum)
Reviewed home exercise Rx with patient today. Pt voices that he is walking daily for 45 minutes, Components of safe exercise program reviewed. Encouraged to always take prescribed medications before engaging in exercise. Stressed to patient to carry cell phone and ID if walking outdoors. Appropriate clothing and shoe choices discussed. Weather parameters for temperature and humidity reviewed and hydration was encouraged before, during and after exercise. THRR of 66-132 dicussed as well as keeping workloads at RPE of 11-13. Warm-up and cool down encouraged with conjunction to walking 5-7x/wk for 30-45 minutes. Reviewed S/S that would require patient to terminate exercise and call MD vs 911. Pt verbalized understanding and was provided copy.  Lorin Picket MS, ACSM-EP-C, CCRP

## 2020-03-05 NOTE — Progress Notes (Signed)
Nutrition Note  Spoke with patient. Reviewed heart healthy diet. Daily recommendation of 28-40 g fiber, <16 g saturated fat, 0 g trans fat, and 2000 mg sodium. Recommended eating pattern of lean protein at every meal or snack, 2 servings fatty fish per week, 3-5 servings non starchy vegetables per day, 2-3 servings fruit per day, 1 oz nuts per day. Provided handouts. Collaborated with pt for goal setting. Provided time and answers for questions. Pt verbalized understanding.   Andrey Campanile, MS, RDN, LDN

## 2020-03-07 ENCOUNTER — Encounter (HOSPITAL_COMMUNITY): Payer: BC Managed Care – PPO

## 2020-03-08 ENCOUNTER — Ambulatory Visit: Payer: BC Managed Care – PPO | Admitting: Cardiology

## 2020-03-09 ENCOUNTER — Telehealth (HOSPITAL_COMMUNITY): Payer: Self-pay | Admitting: Nurse Practitioner

## 2020-03-09 ENCOUNTER — Encounter (HOSPITAL_COMMUNITY): Payer: BC Managed Care – PPO

## 2020-03-12 ENCOUNTER — Other Ambulatory Visit: Payer: Self-pay

## 2020-03-12 ENCOUNTER — Encounter (HOSPITAL_COMMUNITY)
Admission: RE | Admit: 2020-03-12 | Discharge: 2020-03-12 | Disposition: A | Discharge status: Home / Self Care | Payer: BC Managed Care – PPO | Source: Ambulatory Visit | Attending: Cardiology | Admitting: Cardiology

## 2020-03-12 DIAGNOSIS — Z951 Presence of aortocoronary bypass graft: Secondary | ICD-10-CM | POA: Diagnosis not present

## 2020-03-12 DIAGNOSIS — I482 Chronic atrial fibrillation, unspecified: Secondary | ICD-10-CM | POA: Diagnosis not present

## 2020-03-12 DIAGNOSIS — I2111 ST elevation (STEMI) myocardial infarction involving right coronary artery: Secondary | ICD-10-CM | POA: Diagnosis not present

## 2020-03-13 NOTE — Progress Notes (Signed)
Cardiac Individual Treatment Plan  Patient Details  Name: Sean Flynn MRN: [id number] Date of Birth: July 09, 1966 Referring Provider:     CARDIAC REHAB PHASE II ORIENTATION from 01/31/2020 in [facility] CARDIAC [facility]  Referring Provider [name],  Dr. 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  allopurinol (ZYLOPRIM) 100 MG tablet, Take 200 mg by mouth daily. , Disp: , Rfl:  .  atorvastatin (LIPITOR) 40 MG tablet, TAKE 1 TABLET (40 MG TOTAL) BY MOUTH DAILY AT 6 PM., Disp: 90 tablet, Rfl: 1 .  clopidogrel (PLAVIX) 75 MG tablet, Take 1 tablet (75 mg total) by mouth daily., Disp: 30 tablet, Rfl: 3 .  colchicine 0.6 MG tablet, Take 0.6 mg by mouth 2 (two) times daily. , Disp: , Rfl:  .  diltiazem (CARDIZEM) 30 MG tablet, Take 1 tablet (30 mg total) by mouth every 6 (six) hours as needed., Disp: 30 tablet, Rfl: 2 .  lisinopril (ZESTRIL) 5 MG tablet, Take 1 tablet (5 mg total) by mouth daily., Disp: 90 tablet, Rfl: 1 .  Multiple Vitamin (MULTIVITAMIN) tablet, Take 1 tablet by mouth daily. , Disp: , Rfl:  .  Omega-3 Fatty Acids (OMEGA-3 FISH OIL) 1200 MG CAPS, Take 2,400 mg by mouth at bedtime. , Disp: , Rfl:  .  pantoprazole (PROTONIX) 40 MG tablet, Take 1 tablet (40 mg total) by mouth daily. (Patient taking differently: Take 40 mg by mouth daily as needed (acid reflux). ), Disp: 60 tablet, Rfl: 2 .  predniSONE (DELTASONE) 5 MG tablet, Take 5 mg by mouth daily with breakfast., Disp: , Rfl:  .  psyllium (METAMUCIL) 58.6 % packet, Take 1 packet by mouth daily., Disp: , Rfl:  .  rivaroxaban (XARELTO) 20 MG TABS tablet, Take 1 tablet (20 mg total) by mouth daily with  supper., Disp: 30 tablet, Rfl: 0 .  zolpidem (AMBIEN) 5 MG tablet, Take 2.5-5 mg by mouth at bedtime as needed for sleep. , Disp: , Rfl:   Past Medical History: Past Medical History:  Diagnosis Date  . Anxiety   . Coronary artery disease   . Former smoker   . Hypertension   . Mixed hyperlipidemia   . TIA (transient ischemic attack)     Tobacco Use: Social History   Tobacco Use  Smoking Status Former Smoker  . Packs/day: 1.00  . Years: 25.00  . Pack years: 25.00  . Types: Cigarettes  . Quit date: 2010  . Years since quitting: 11.6  Smokeless Tobacco Never Used    Labs: Recent Hydrographic surveyor    Labs for ITP Cardiac and Pulmonary Rehab Latest Ref Rng & Units 11/07/2019 11/07/2019 11/07/2019 11/07/2019 11/07/2019   Cholestrol 0 - 200 mg/dL - - - - -   LDLCALC 0 - 99 mg/dL - - - - -   HDL >94 mg/dL - - - - -   Trlycerides <150 mg/dL - - - - -   Hemoglobin A1c 4.8 - 5.6 % - - - - -   PHART 7.35 - 7.45 - 7.308(L) - 7.300(L) 7.388   PCO2ART 32 - 48 mmHg - 48.7(H) - 49.3(H) 37.6   HCO3 20.0 - 28.0 mmol/L -  24.4 - 24.2 22.7   TCO2 22 - 32 mmol/L 27 26 25 26 24    ACIDBASEDEF 0.0 - 2.0 mmol/L - 2.0 - 3.0(H) 2.0   O2SAT % - 95.0 - 95.0 97.0      Capillary Blood Glucose: Lab Results  Component Value Date   GLUCAP 133 (H) 11/09/2019   GLUCAP 118 (H) 11/09/2019   GLUCAP 118 (H) 11/08/2019   GLUCAP 139 (H) 11/08/2019   GLUCAP 140 (H) 11/08/2019     Exercise Target Goals: Exercise Program Goal: Individual exercise prescription set using results from initial 6 min walk test and THRR while considering  patient's activity barriers and safety.   Exercise Prescription Goal: Starting with aerobic activity 30 plus minutes a day, 3 days per week for initial exercise prescription. Provide home exercise prescription and guidelines that participant acknowledges understanding prior to discharge.  Activity Barriers & Risk Stratification:  Activity Barriers & Cardiac Risk Stratification  - 01/31/20 1303      Activity Barriers & Cardiac Risk Stratification   Activity Barriers Other (comment)    Comments dizziness    Cardiac Risk Stratification High           6 Minute Walk:  6 Minute Walk    Row Name 01/31/20 1038         6 Minute Walk   Phase Initial     Distance 1411 feet     Walk Time 6 minutes     # of Rest Breaks 0     MPH 2.67     METS 3.45     RPE 12     Perceived Dyspnea  1     VO2 Peak 12.08     Symptoms Yes (comment)     Comments RPD = 1     Resting HR 94 bpm     Resting BP 104/68     Resting Oxygen Saturation  97 %     Exercise Oxygen Saturation  during 6 min walk 99 %     Max Ex. HR 110 bpm     Max Ex. BP 114/68     2 Minute Post BP 100/60            Oxygen Initial Assessment:   Oxygen Re-Evaluation:   Oxygen Discharge (Final Oxygen Re-Evaluation):   Initial Exercise Prescription:  Initial Exercise Prescription - 01/31/20 1300      Date of Initial Exercise RX and Referring Provider   Date 01/31/20    Referring Provider [name],  Dr. 02/02/20    Expected Discharge Date 03/30/20      Recumbant Bike   Level 2    RPM 60    Minutes 15    METs 3.5      Arm Ergometer   Level 2    Watts 5    Minutes 15      Prescription Details   Frequency (times per week) 3    Duration Progress to 30 minutes of continuous aerobic without signs/symptoms of physical distress      Intensity   THRR 40-80% of Max Heartrate 66-132    Ratings of Perceived Exertion 11-13    Perceived Dyspnea 0-4      Progression   Progression Continue progressive overload as per policy without signs/symptoms or physical distress.      Resistance Training   Training Prescription Yes    Weight 5    Reps 10-15           Perform Capillary Blood  Glucose checks as needed.  Exercise Prescription Changes:   Exercise Prescription Changes    Row Name 02/20/20 1000 03/14/20 1206           Response to Exercise   Blood Pressure (Admit) 128/76 108/66       Blood Pressure (Exercise) 128/70 110/70      Blood Pressure (Exit) 120/80 116/78      Heart Rate (Admit) 100 bpm 96 bpm      Heart Rate (Exercise) 103 bpm 108 bpm      Heart Rate (Exit) 99 bpm 87 bpm      Rating of Perceived Exertion (Exercise) 12 12      Perceived Dyspnea (Exercise) 0 0      Symptoms None None      Comments Pt's first day of exercise  Pt reporting chronic knee pain, RB on hold      Duration Progress to 30 minutes of  aerobic without signs/symptoms of physical distress Continue with 30 min of aerobic exercise without signs/symptoms of physical distress.      Intensity THRR unchanged THRR unchanged        Progression   Progression Continue to progress workloads to maintain intensity without signs/symptoms of physical distress. Continue to progress workloads to maintain intensity without signs/symptoms of physical distress.      Average METs 2.2 2.2        Resistance Training   Training Prescription Yes Yes      Weight 5lbs 5lbs      Reps 10-15 10-15      Time 10 Minutes 10 Minutes        Interval Training   Interval Training No No        Recumbant Bike   Level 2 --      RPM 50 --      Minutes 15 --      METs 2.2 --        Arm Ergometer   Level 2 2      Watts 4 8      Minutes 15 30        Home Exercise Plan   Plans to continue exercise at -- Home (comment)  Walking      Frequency -- Add 3 additional days to program exercise sessions.      Initial Home Exercises Provided -- 03/07/20             Exercise Comments:   Exercise Comments    Row Name 02/20/20 1053 03/05/20 1219         Exercise Comments Pt's first day of exercise. Pt oriented to exercise equipment. Responded well to workloads. Will continue to monitor and progress as tolerated. Reviewed home exercise Rx with patient today. Pt was provided a copy. Pt is walking 7x/wk for 45 minutes.             Exercise Goals and Review:   Exercise Goals    Row Name 01/31/20 1307              Exercise Goals   Increase Physical Activity Yes       Intervention Provide advice, education, support and counseling about physical activity/exercise needs.;Develop an individualized exercise prescription for aerobic and resistive training based on initial evaluation findings, risk stratification, comorbidities and participant's personal goals.       Expected Outcomes Short Term: Attend rehab on a regular basis to increase amount of physical activity.;Long Term: Add in home exercise to make exercise part  of routine and to increase amount of physical activity.;Long Term: Exercising regularly at least 3-5 days a week.       Increase Strength and Stamina Yes       Intervention Provide advice, education, support and counseling about physical activity/exercise needs.;Develop an individualized exercise prescription for aerobic and resistive training based on initial evaluation findings, risk stratification, comorbidities and participant's personal goals.       Expected Outcomes Short Term: Increase workloads from initial exercise prescription for resistance, speed, and METs.;Short Term: Perform resistance training exercises routinely during rehab and add in resistance training at home;Long Term: Improve cardiorespiratory fitness, muscular endurance and strength as measured by increased METs and functional capacity ( )       Able to understand and use rate of perceived exertion (RPE) scale Yes       Intervention Provide education and explanation on how to use RPE scale       Expected Outcomes Short Term: Able to use RPE daily in rehab to express subjective intensity level;Long Term:  Able to use RPE to guide intensity level when exercising independently       Knowledge and understanding of Target Heart Rate Range (THRR) Yes       Intervention Provide education and explanation of THRR including how the numbers were predicted and where they are located for reference       Expected Outcomes Short Term: Able  to state/look up THRR;Short Term: Able to use daily as guideline for intensity in rehab;Long Term: Able to use THRR to govern intensity when exercising independently       Able to check pulse independently Yes       Intervention Provide education and demonstration on how to check pulse in carotid and radial arteries.;Review the importance of being able to check your own pulse for safety during independent exercise       Expected Outcomes Short Term: Able to explain why pulse checking is important during independent exercise;Long Term: Able to check pulse independently and accurately       Understanding of Exercise Prescription Yes       Intervention Provide education, explanation, and written materials on patient's individual exercise prescription       Expected Outcomes Short Term: Able to explain program exercise prescription;Long Term: Able to explain home exercise prescription to exercise independently              Exercise Goals Re-Evaluation :  Exercise Goals Re-Evaluation    Row Name 02/20/20 1053 03/05/20 1220           Exercise Goal Re-Evaluation   Exercise Goals Review Increase Physical Activity;Increase Strength and Stamina Increase Physical Activity;Increase Strength and Stamina;Able to understand and use rate of perceived exertion (RPE) scale;Knowledge and understanding of Target Heart Rate Range (THRR);Able to check pulse independently;Understanding of Exercise Prescription      Comments Pt's first day of exercise. Pt responded well to exercise prescription. Able to exercise 30 minutes with no difficulty. Will continue to monitor. Reviewed home exercise Rx with patient. Pt voices walking daily for 45 minutes.      Expected Outcomes Pt will continue to increase strength, stamina and cardiovascular fitness. Pt will continue to walk 7x/wk for 45 minutes. Will abide by the guidelines outlined in the Exercise Rx.              Discharge Exercise Prescription (Final Exercise  Prescription Changes):  Exercise Prescription Changes - 03/14/20 1206      Response to  Exercise   Blood Pressure (Admit) 108/66    Blood Pressure (Exercise) 110/70    Blood Pressure (Exit) 116/78    Heart Rate (Admit) 96 bpm    Heart Rate (Exercise) 108 bpm    Heart Rate (Exit) 87 bpm    Rating of Perceived Exertion (Exercise) 12    Perceived Dyspnea (Exercise) 0    Symptoms None    Comments Pt reporting chronic knee pain, RB on hold    Duration Continue with 30 min of aerobic exercise without signs/symptoms of physical distress.    Intensity THRR unchanged      Progression   Progression Continue to progress workloads to maintain intensity without signs/symptoms of physical distress.    Average METs 2.2      Resistance Training   Training Prescription Yes    Weight 5lbs    Reps 10-15    Time 10 Minutes      Interval Training   Interval Training No      Arm Ergometer   Level 2    Watts 8    Minutes 30      Home Exercise Plan   Plans to continue exercise at Home (comment)   Walking   Frequency Add 3 additional days to program exercise sessions.    Initial Home Exercises Provided 03/07/20           Nutrition:  Target Goals: Understanding of nutrition guidelines, daily intake of sodium 1500mg , cholesterol 200mg , calories 30% from fat and 7% or less from saturated fats, daily to have 5 or more servings of fruits and vegetables.  Biometrics:  Pre Biometrics - 01/31/20 1234      Pre Biometrics   Waist Circumference 44 inches    Hip Circumference 42.5 inches    Waist to Hip Ratio 1.04 %    Triceps Skinfold 8 mm    % Body Fat 28.8 %    Grip Strength 49 kg    Flexibility 13.5 in    Single Leg Stand 54 seconds            Nutrition Therapy Plan and Nutrition Goals:  Nutrition Therapy & Goals - 03/02/20 1014      Nutrition Therapy   Diet Heart Healthy      Personal Nutrition Goals   Nutrition Goal Pt to identify food quantities necessary to achieve weight  loss of 6-24 lb at graduation from cardiac rehab.    Personal Goal #2 Pt to build a healthy plate including vegetables, fruits, whole grains, and low-fat dairy products in a heart healthy meal plan.      Intervention Plan   Intervention Nutrition handout(s) given to patient.;Prescribe, educate and counsel regarding individualized specific dietary modifications aiming towards targeted core components such as weight, hypertension, lipid management, diabetes, heart failure and other comorbidities.    Expected Outcomes Short Term Goal: Understand basic principles of dietary content, such as calories, fat, sodium, cholesterol and nutrients.           Nutrition Assessments:  Nutrition Assessments - 03/02/20 1015      MEDFICTS Scores   Pre Score 9           Nutrition Goals Re-Evaluation:  Nutrition Goals Re-Evaluation    Row Name 03/02/20 1015             Goals   Current Weight 222 lb (100.7 kg)       Nutrition Goal Pt to identify food quantities necessary to achieve weight loss of 6-24 lb  at graduation from cardiac rehab.         Personal Goal #2 Re-Evaluation   Personal Goal #2 Pt to build a healthy plate including vegetables, fruits, whole grains, and low-fat dairy products in a heart healthy meal plan.              Nutrition Goals Discharge (Final Nutrition Goals Re-Evaluation):  Nutrition Goals Re-Evaluation - 03/02/20 1015      Goals   Current Weight 222 lb (100.7 kg)    Nutrition Goal Pt to identify food quantities necessary to achieve weight loss of 6-24 lb at graduation from cardiac rehab.      Personal Goal #2 Re-Evaluation   Personal Goal #2 Pt to build a healthy plate including vegetables, fruits, whole grains, and low-fat dairy products in a heart healthy meal plan.           Psychosocial: Target Goals: Acknowledge presence or absence of significant depression and/or stress, maximize coping skills, provide positive support system. Participant is able to  verbalize types and ability to use techniques and skills needed for reducing stress and depression.  Initial Review & Psychosocial Screening:  Initial Psych Review & Screening - 01/31/20 1346      Initial Review   Current issues with None Identified      Family Dynamics   Good Support System? Yes   Sean Flynn has his wife for support     Barriers   Psychosocial barriers to participate in program There are no identifiable barriers or psychosocial needs.      Screening Interventions   Interventions Encouraged to exercise           Quality of Life Scores:  Quality of Life - 01/31/20 1247      Quality of Life Scores   Health/Function Pre 27.2 %    Socioeconomic Pre 28.31 %    Psych/Spiritual Pre 25.5 %    Family Pre 30 %    GLOBAL Pre 27.51 %          Scores of 19 and below usually indicate a poorer quality of life in these areas.  A difference of  2-3 points is a clinically meaningful difference.  A difference of 2-3 points in the total score of the Quality of Life Index has been associated with significant improvement in overall quality of life, self-image, physical symptoms, and general health in studies assessing change in quality of life.  PHQ-9: Recent Review Flowsheet Data    Depression screen [facility] 2/9 01/31/2020   Decreased Interest 0   Down, Depressed, Hopeless 0   PHQ - 2 Score 0     Interpretation of Total Score  Total Score Depression Severity:  1-4 = Minimal depression, 5-9 = Mild depression, 10-14 = Moderate depression, 15-19 = Moderately severe depression, 20-27 = Severe depression   Psychosocial Evaluation and Intervention:   Psychosocial Re-Evaluation:  Psychosocial Re-Evaluation    Row Name 02/23/20 1454 03/13/20 1544           Psychosocial Re-Evaluation   Current issues with None Identified Current Stress Concerns      Comments Sean Flynn Started late due to gout but is feeling better Sean Flynn has had some stress concerns due to his chronic knee pain and gout       Interventions Encouraged to attend Cardiac Rehabilitation for the exercise Encouraged to attend Cardiac Rehabilitation for the exercise      Continue Psychosocial Services  No Follow up required No Follow up required  Comments -- Will continue to monitor and offier support as needed        Initial Review   Source of Stress Concerns -- Chronic Illness             Psychosocial Discharge (Final Psychosocial Re-Evaluation):  Psychosocial Re-Evaluation - 03/13/20 1544      Psychosocial Re-Evaluation   Current issues with Current Stress Concerns    Comments Sean Flynn has had some stress concerns due to his chronic knee pain and gout    Interventions Encouraged to attend Cardiac Rehabilitation for the exercise    Continue Psychosocial Services  No Follow up required    Comments Will continue to monitor and offier support as needed      Initial Review   Source of Stress Concerns Chronic Illness           Vocational Rehabilitation: Provide vocational rehab assistance to qualifying candidates.   Vocational Rehab Evaluation & Intervention:  Vocational Rehab - 01/31/20 1349      Initial Vocational Rehab Evaluation & Intervention   Assessment shows need for Vocational Rehabilitation No   Sean Flynn is retired and does not need vocational rehab at this time          Education: Education Goals: Education classes will be provided on a weekly basis, covering required topics. Participant will state understanding/return demonstration of topics presented.  Learning Barriers/Preferences:  Learning Barriers/Preferences - 01/31/20 1256      Learning Barriers/Preferences   Learning Barriers Sight   wears glasses   Learning Preferences Skilled Demonstration           Education Topics: Hypertension, Hypertension Reduction -Define heart disease and high blood pressure. Discus how high blood pressure affects the body and ways to reduce high blood pressure.   Exercise and Your  Heart -Discuss why it is important to exercise, the FITT principles of exercise, normal and abnormal responses to exercise, and how to exercise safely.   Angina -Discuss definition of angina, causes of angina, treatment of angina, and how to decrease risk of having angina.   Cardiac Medications -Review what the following cardiac medications are used for, how they affect the body, and side effects that may occur when taking the medications.  Medications include Aspirin, Beta blockers, calcium channel blockers, ACE Inhibitors, angiotensin receptor blockers, diuretics, digoxin, and antihyperlipidemics.   Congestive Heart Failure -Discuss the definition of CHF, how to live with CHF, the signs and symptoms of CHF, and how keep track of weight and sodium intake.   Heart Disease and Intimacy -Discus the effect sexual activity has on the heart, how changes occur during intimacy as we age, and safety during sexual activity.   Smoking Cessation / COPD -Discuss different methods to quit smoking, the health benefits of quitting smoking, and the definition of COPD.   Nutrition I: Fats -Discuss the types of cholesterol, what cholesterol does to the heart, and how cholesterol levels can be controlled.   Nutrition II: Labels -Discuss the different components of food labels and how to read food label   Heart Parts/Heart Disease and PAD -Discuss the anatomy of the heart, the pathway of blood circulation through the heart, and these are affected by heart disease.   Stress I: Signs and Symptoms -Discuss the causes of stress, how stress may lead to anxiety and depression, and ways to limit stress.   Stress II: Relaxation -Discuss different types of relaxation techniques to limit stress.   Warning Signs of Stroke / TIA -Discuss definition of a  stroke, what the signs and symptoms are of a stroke, and how to identify when someone is having stroke.   Knowledge Questionnaire Score:  Knowledge  Questionnaire Score - 01/31/20 1256      Knowledge Questionnaire Score   Pre Score 21/24           Core Components/Risk Factors/Patient Goals at Admission:  Personal Goals and Risk Factors at Admission - 01/31/20 1258      Core Components/Risk Factors/Patient Goals on Admission    Weight Management Yes;Obesity;Weight Loss    Intervention Weight Management: Develop a combined nutrition and exercise program designed to reach desired caloric intake, while maintaining appropriate intake of nutrient and fiber, sodium and fats, and appropriate energy expenditure required for the weight goal.;Weight Management: Provide education and appropriate resources to help participant work on and attain dietary goals.;Weight Management/Obesity: Establish reasonable short term and long term weight goals.;Obesity: Provide education and appropriate resources to help participant work on and attain dietary goals.    Admit Weight 226 lb 6.6 oz (102.7 kg)    Goal Weight: Long Term 200 lb (90.7 kg)    Expected Outcomes Short Term: Continue to assess and modify interventions until short term weight is achieved;Long Term: Adherence to nutrition and physical activity/exercise program aimed toward attainment of established weight goal;Weight Loss: Understanding of general recommendations for a balanced deficit meal plan, which promotes 1-2 lb weight loss per week and includes a negative energy balance of [phone] kcal/d;Understanding recommendations for meals to include 15-35% energy as protein, 25-35% energy from fat, 35-60% energy from carbohydrates, less than  of dietary cholesterol, 20-35 gm of total fiber daily;Understanding of distribution of calorie intake throughout the day with the consumption of 4-5 meals/snacks    Hypertension Yes    Intervention Provide education on lifestyle modifcations including regular physical activity/exercise, weight management, moderate sodium restriction and increased consumption of  fresh fruit, vegetables, and low fat dairy, alcohol moderation, and smoking cessation.;Monitor prescription use compliance.    Expected Outcomes Short Term: Continued assessment and intervention until BP is < 140/85mm HG in hypertensive participants. < 130/31mm HG in hypertensive participants with diabetes, heart failure or chronic kidney disease.;Long Term: Maintenance of blood pressure at goal levels.    Lipids Yes    Intervention Provide education and support for participant on nutrition & aerobic/resistive exercise along with prescribed medications to achieve LDL 70mg , HDL >40mg .    Expected Outcomes Short Term: Participant states understanding of desired cholesterol values and is compliant with medications prescribed. Participant is following exercise prescription and nutrition guidelines.;Long Term: Cholesterol controlled with medications as prescribed, with individualized exercise RX and with personalized nutrition plan. Value goals: LDL < , HDL > 40 mg.           Core Components/Risk Factors/Patient Goals Review:   Goals and Risk Factor Review    Row Name 02/23/20 1455 03/13/20 1546           Core Components/Risk Factors/Patient Goals Review   Personal Goals Review Weight Management/Obesity;Lipids;Hypertension Weight Management/Obesity;Lipids;Hypertension      Review Sean Flynn is off to a good start to exercise this week. Sean Flynn's vital signs have been stable. Sean Flynn is taking a prednisone pack for gout Sean Flynn is doing okay with exercise. Sean Flynn has only been able to use the arm ergometer this week due to chronic knee pain and gout. Sean Flynn plans to follow up with his orthopedic provider      Expected Outcomes Sean Flynn will continue to participate in phase 2 cardiac rehab  for exercise, nutrtion and lifestyle modifications Sean Flynn will continue to participate in phase 2 cardiac rehab for exercise, nutrtion and lifestyle modifications             Core Components/Risk Factors/Patient Goals at Discharge  (Final Review):   Goals and Risk Factor Review - 03/13/20 1546      Core Components/Risk Factors/Patient Goals Review   Personal Goals Review Weight Management/Obesity;Lipids;Hypertension    Review Sean Flynn is doing okay with exercise. Sean Flynn has only been able to use the arm ergometer this week due to chronic knee pain and gout. Sean Flynn plans to follow up with his orthopedic provider    Expected Outcomes Sean Flynn will continue to participate in phase 2 cardiac rehab for exercise, nutrtion and lifestyle modifications           ITP Comments:  ITP Comments    Row Name 01/31/20 1329 02/23/20 1451 03/13/20 1543       ITP Comments Dr Armanda Magic MD, Medical Director 30 Day ITP Review. Sean Flynn started exercise at cardiac rehab this week and is doing well with exercise. 30 Day ITP Review. Sean Flynn is with good participation in phase 2 cardiac rehab. Sean Flynn has had some limitations due to his chronic knee pain and gout which caused Sean Flynn to be out a few days            Comments: See ITP comments.[name], RN,BSN 03/15/2020 1:10 PM

## 2020-03-14 ENCOUNTER — Encounter (HOSPITAL_COMMUNITY)
Admission: RE | Admit: 2020-03-14 | Discharge: 2020-03-14 | Disposition: A | Discharge status: Home / Self Care | Payer: BC Managed Care – PPO | Source: Ambulatory Visit | Attending: Cardiology | Admitting: Cardiology

## 2020-03-14 ENCOUNTER — Other Ambulatory Visit: Payer: Self-pay

## 2020-03-14 DIAGNOSIS — M9903 Segmental and somatic dysfunction of lumbar region: Secondary | ICD-10-CM | POA: Diagnosis not present

## 2020-03-14 DIAGNOSIS — Z951 Presence of aortocoronary bypass graft: Secondary | ICD-10-CM

## 2020-03-14 DIAGNOSIS — I2111 ST elevation (STEMI) myocardial infarction involving right coronary artery: Secondary | ICD-10-CM | POA: Diagnosis not present

## 2020-03-14 DIAGNOSIS — M5136 Other intervertebral disc degeneration, lumbar region: Secondary | ICD-10-CM | POA: Diagnosis not present

## 2020-03-15 ENCOUNTER — Encounter: Payer: Self-pay | Admitting: Cardiology

## 2020-03-15 ENCOUNTER — Ambulatory Visit: Payer: BC Managed Care – PPO | Admitting: Cardiology

## 2020-03-15 VITALS — BP 117/80 | HR 85 | Resp 16 | Ht 69.0 in | Wt 221.0 lb

## 2020-03-15 DIAGNOSIS — I739 Peripheral vascular disease, unspecified: Secondary | ICD-10-CM | POA: Diagnosis not present

## 2020-03-15 DIAGNOSIS — I25708 Atherosclerosis of coronary artery bypass graft(s), unspecified, with other forms of angina pectoris: Secondary | ICD-10-CM | POA: Diagnosis not present

## 2020-03-15 DIAGNOSIS — I480 Paroxysmal atrial fibrillation: Secondary | ICD-10-CM | POA: Insufficient documentation

## 2020-03-15 DIAGNOSIS — I471 Supraventricular tachycardia, unspecified: Secondary | ICD-10-CM | POA: Insufficient documentation

## 2020-03-15 NOTE — Progress Notes (Signed)
Follow up visit  Subjective:   Sean Flynn, male    DOB: April 11, 1966, 54 y.o.   MRN: [id number]    Chief Complaint  Patient presents with  . Paroxysmal Atrial Fibrillation  . Follow-up    4 week     54 y.o. Caucasian male with hypertension, hyperlipidemia, CAD s/p CABGX2 (LIMA-LAD, RIMA-RCA 10/2019), PAF (RVR episode 02/2019),  PAD, former smoker, h/o EtOH overuse, PAD, gout, migraine.   Recent event monitor showed several episodes of SVT, fastest and longest at 21 bpm. He has not had any symptoms associated with it. Denies any chest pain, shortness of breath. He is working with cardiac rehab, performing mostly upper body exercises limited due to his gout flare ups. However, he walks up to two miles on his own without any lifestyle limiting claudication.   Current Outpatient Medications on File Prior to Visit  Medication Sig Dispense Refill  . allopurinol (ZYLOPRIM) 100 MG tablet Take 200 mg by mouth daily.     Marland Kitchen atorvastatin (LIPITOR) 40 MG tablet TAKE 1 TABLET (40 MG TOTAL) BY MOUTH DAILY AT 6 PM. 90 tablet 1  . clopidogrel (PLAVIX) 75 MG tablet Take 1 tablet (75 mg total) by mouth daily. 30 tablet 3  . colchicine 0.6 MG tablet Take 0.6 mg by mouth 2 (two) times daily.     Marland Kitchen diltiazem (CARDIZEM) 30 MG tablet Take 1 tablet (30 mg total) by mouth every 6 (six) hours as needed. 30 tablet 2  . lisinopril (ZESTRIL) 5 MG tablet Take 1 tablet (5 mg total) by mouth daily. 90 tablet 1  . Multiple Vitamin (MULTIVITAMIN) tablet Take 1 tablet by mouth daily.     . Omega-3 Fatty Acids (OMEGA-3 FISH OIL) 1200 MG CAPS Take 2,400 mg by mouth at bedtime.     . pantoprazole (PROTONIX) 40 MG tablet Take 1 tablet (40 mg total) by mouth daily. (Patient taking differently: Take 40 mg by mouth daily as needed (acid reflux). ) 60 tablet 2  . predniSONE (DELTASONE) 5 MG tablet Take 5 mg by mouth daily with breakfast.    . psyllium (METAMUCIL) 58.6 % packet Take 1 packet by mouth daily.    . rivaroxaban  (XARELTO) 20 MG TABS tablet Take 1 tablet (20 mg total) by mouth daily with supper. 30 tablet 0  . zolpidem (AMBIEN) 5 MG tablet Take 2.5-5 mg by mouth at bedtime as needed for sleep.      No current facility-administered medications on file prior to visit.    Cardiovascular & other pertient studies:  8 day telemetry 02/10/2020 - 02/19/2020: Dominant rhythm: Sinus. HR 53-197 bpm. Avg HR 81 bpm. One 4 beat NSVT. 17 SVT episodes, fastest and longest at 197 bpm for 21 beats VE, SVE <1% No atrial fibrillation/atrial flutter/high grade AV block, sinus pause >3sec noted. Patient triggered events: 3 correlate with sinus rhythm    EKG 02/10/2020: Sinus rhythm 81 bpm Normal EKG  EKG 02/02/2020: A. fib with RVR 160 bpm.  Diffuse ST-T changes, consider ischemia  EKG 12/23/2019:  Sinus rhythm 69 bpm  Nonspecific T-abnormality inferior leads  MRI brain 12/16/2019: 1. No acute intracranial abnormality. 2. Advanced atrophy and findings of chronic small vessel disease.  Op note 11/07/2019 (Dr. Roxan Hockey): 1.  Median sternotomy.  2.  Extracorporeal circulation 3.  Coronary artery bypass grafting x 2      Left internal mammary artery to left anterior descending,      Free right internal mammary artery to distal right coronary.  LEA duplex 11/04/2019: Patent distal abdominal aorta and bilateral iliac arteries without  evidence of significant stenosis.  Right: 75-99% stenosis noted in the superficial femoral artery. Posterior  tibial artery appears occluded.  Left: 75-99% stenosis noted in the superficial femoral artery. Posterior  tibial artery appears occluded.   Echocardiogram 11/04/2019: 1. Mild LVH. Normal LV size. Mild global hypokinesis. LVEF 45-50%. Normal  diastolic function.  2. Normal RV size and function.  3. No significant valvular abnormality.  4. Normal right atrial pressure.  Precharting  Coronary angiogram 11/03/2019: LM: Normal LAD: Severe ostial to mid calcification  with long 50-75% calcified lesion upto mid LAD. Distal vessel is spared of calcification, with excellent distal surgical target.  LCx: Mid calcific 50% stenosis. RCA: Severely calcified prox CTO with a narrow antegrade collateral, adequate to maintain TIMI III flow at rest, follwowed by log mid 60-80% long calcified lesion. Distal vessel is spared of calcification, with excellent distal surgical target.   LVEDP 20 mmHg   Recent labs: 02/02/2020: Glucose 174, BUN/Cr 7/0.95. EGFR >60. Na/K 133/4.3.  H/H 14/44. MCV 85. Platelets 229  12/15/2019: Glucose 112, BUN/Cr 10/0.85. EGFR 60. Na/K 135/4.8. Chloride 96. Rest of the CMP normal H/H 14/42. Platelets 294 Chol 144, TG 317, HDL 54, LDL 27  11/10/2019: Glucose 122, BUN/Cr 17/1.09. EGFR >60. Na/K 136/3.9. Rest of the CMP normal H/H 10/30. MCV 95. Platelets 242 HbA1C 5.5% Chol 144, TG 317, HDL 54, LDL 27 Lipoprotein (a) 213, elevated   Review of Systems  Cardiovascular: Positive for claudication. Negative for chest pain, dyspnea on exertion, leg swelling, palpitations and syncope.       Vitals:   03/15/20 1058  BP: 117/80  Pulse: 85  Resp: 16  SpO2: 96%     Body mass index is 32.64 kg/m. Filed Weights   03/15/20 1058  Weight: 221 lb (100.2 kg)     Objective:   Physical Exam Vitals and nursing note reviewed.  Constitutional:      Appearance: He is well-developed.  Neck:     Vascular: No JVD.  Cardiovascular:     Rate and Rhythm: Normal rate and regular rhythm.     Pulses:          Femoral pulses are 2+ on the right side and 2+ on the left side.      Popliteal pulses are 0 on the right side and 1+ on the left side.       Dorsalis pedis pulses are 0 on the right side and 0 on the left side.       Posterior tibial pulses are 0 on the right side and 0 on the left side.     Heart sounds: Normal heart sounds. No murmur heard.   Pulmonary:     Effort: Pulmonary effort is normal.     Breath sounds: Normal breath sounds.  No wheezing or rales.         Assessment & Recommendations:   54 y.o. Caucasian male with hypertension, hyperlipidemia, CAD s/p CABGX2 (LIMA-LAD, RIMA-RCA 10/2019), PAF (RVR episode 02/2019),  PAD, former smoker, h/o EtOH overuse, PAD, gout, migraine.  PAF/SVT: RVR episode on 02/02/2020 with self conversion.  Sinus rhythm. CHA2DS2VASc score 3, annual stroke risk 3% Continue Xarelto 20 mg daily. Intolerant to metoprolol, causes gout flareup Recommend as needed diltiazem 30 mg every 6 hours as needed.  Recommend reducing colchicine dose to once a day on days he does take diltiazem.  CAD: S/p CABGX2 (LIMA-LAD, RIMA-RCA). Continue lisinopril 5  mg daily, lipitor 80 mg daily.  Not ob beta blocker due severe gout attacks.   In light of CAD, and PAD, continue Plavix 75 mg daily.  No ongoing bleeding issues. Continue cardiac rehab.  PAD:  Non-lifestyle limiting b/l calf claudication. Suspect b/l SFA stenoses. Continue medical management.  Continue Plavix 75 mg daily. Continue statin. Encourage regular walking.   Elevated lipoprotein (a): Conitnue statin for nowEligible enroll in HORIZON trial. He wants to wait for now, rethink at next visit in 07/2020.  F/u in 07/2020  Nigel Mormon, MD [facility] Pager: [phone] Office: [phone]

## 2020-03-16 ENCOUNTER — Other Ambulatory Visit: Payer: Self-pay

## 2020-03-16 ENCOUNTER — Encounter (HOSPITAL_COMMUNITY)
Admission: RE | Admit: 2020-03-16 | Discharge: 2020-03-16 | Disposition: A | Discharge status: Home / Self Care | Payer: BC Managed Care – PPO | Source: Ambulatory Visit | Attending: Cardiology | Admitting: Cardiology

## 2020-03-16 DIAGNOSIS — I2111 ST elevation (STEMI) myocardial infarction involving right coronary artery: Secondary | ICD-10-CM | POA: Diagnosis not present

## 2020-03-16 DIAGNOSIS — Z951 Presence of aortocoronary bypass graft: Secondary | ICD-10-CM | POA: Diagnosis not present

## 2020-03-19 ENCOUNTER — Ambulatory Visit: Payer: BC Managed Care – PPO | Admitting: Cardiology

## 2020-03-19 ENCOUNTER — Other Ambulatory Visit: Payer: Self-pay

## 2020-03-19 ENCOUNTER — Encounter (HOSPITAL_COMMUNITY)
Admission: RE | Admit: 2020-03-19 | Discharge: 2020-03-19 | Disposition: A | Discharge status: Home / Self Care | Payer: BC Managed Care – PPO | Source: Ambulatory Visit | Attending: Cardiology | Admitting: Cardiology

## 2020-03-19 DIAGNOSIS — Z951 Presence of aortocoronary bypass graft: Secondary | ICD-10-CM

## 2020-03-19 DIAGNOSIS — G8929 Other chronic pain: Secondary | ICD-10-CM | POA: Diagnosis not present

## 2020-03-19 DIAGNOSIS — I2111 ST elevation (STEMI) myocardial infarction involving right coronary artery: Secondary | ICD-10-CM | POA: Diagnosis not present

## 2020-03-19 DIAGNOSIS — M1711 Unilateral primary osteoarthritis, right knee: Secondary | ICD-10-CM | POA: Diagnosis not present

## 2020-03-19 DIAGNOSIS — M2351 Chronic instability of knee, right knee: Secondary | ICD-10-CM | POA: Diagnosis not present

## 2020-03-19 DIAGNOSIS — M25561 Pain in right knee: Secondary | ICD-10-CM | POA: Diagnosis not present

## 2020-03-20 ENCOUNTER — Ambulatory Visit: Payer: BC Managed Care – PPO | Admitting: Neurology

## 2020-03-20 ENCOUNTER — Encounter: Payer: Self-pay | Admitting: Neurology

## 2020-03-20 VITALS — BP 122/85 | HR 81 | Ht 69.0 in | Wt 222.0 lb

## 2020-03-20 DIAGNOSIS — Z8679 Personal history of other diseases of the circulatory system: Secondary | ICD-10-CM

## 2020-03-20 DIAGNOSIS — I739 Peripheral vascular disease, unspecified: Secondary | ICD-10-CM

## 2020-03-20 DIAGNOSIS — I249 Acute ischemic heart disease, unspecified: Secondary | ICD-10-CM | POA: Diagnosis not present

## 2020-03-20 DIAGNOSIS — I700 Atherosclerosis of aorta: Secondary | ICD-10-CM

## 2020-03-20 DIAGNOSIS — I25708 Atherosclerosis of coronary artery bypass graft(s), unspecified, with other forms of angina pectoris: Secondary | ICD-10-CM

## 2020-03-20 NOTE — Patient Instructions (Signed)

## 2020-03-20 NOTE — Progress Notes (Signed)
SLEEP MEDICINE CLINIC    Provider:  Melvyn Novas, MD  Primary Care Physician:  Elizabeth Palau, FNP [address] [place] [address]     Referring Provider: Elizabeth Palau, Fnp [address] [place],  [place] [address]          Chief Complaint according to patient   Patient presents with:     New Patient (Initial Visit)           HISTORY OF PRESENT ILLNESS: on 03/20/2020. Sean Flynn is a 54 y.o. year old Caucasian male patient of port[place]- [place]  descent and seen here in a consultation requested by  Dr. Rosemary Holms.   Chief concern according to patient : I had a heart attack on April 1st and on the 5th I underwent 2 vessel bypass - I lost weight since then and stopped snoring; but my cardiologist is concerned about apnea. My heart CT was 5000 score for calcium burden, or cholesterol burden" . And ; July 1st,  I had atrial fibrillation". Cardiac monitor just finished negative the week prior.    I have the pleasure of seeing Sean Flynn today, a right -handed Caucasian male with a possible sleep disorder.  She has a  has a past medical history of Anxiety, Coronary artery disease, Former smoker, Hypertension, Mixed hyperlipidemia, and TIA (transient ischemic attack).   Sleep relevant medical history: Nocturia 3-4 times , Tonsillectomy, complicated migraine versus TIA.    Family medical /sleep history: father had CAD and CVA times 2,  likely with OSA.    Social history:  Patient is retired from the [employer] , [place] , [place].   He lives in a household with spouse-  alone. Family status is married with 7 children, 3 biological children.  Pets are present. One dog.  Tobacco use: quit 2010.  ETOH use : stopped April 1 st.   Caffeine intake in form of Coffee(2-3 a day) Soda( /) Tea ( /) or energy drinks. Regular exercise in form of walking.   Hobbies : exercise.      Sleep habits are as follows:  The patient's dinner  time is between 7-8  PM. The patient goes to bed at 10-11 PM and continues to sleep for 2 hours, wakes for many, many bathroom breaks, the first time at 12 AM.   The preferred sleep position is prone , with the support of 2 pillows.  Dreams are reportedly very rare.  6.30 AM is the usual rise time. The patient wakes up spontaneously. He reports feeling more refreshed or restored in AM since his weight loss of 35 pounds- , without symptoms such as dry mouth , rare morning headaches, and residual fatigue.  Naps are taken infrequently, but did  Daily naps before bypass surgery. 15 -20 minutes power naps.   Review of Systems: Out of a complete 14 system review, the patient complains of only the following symptoms, and all other reviewed systems are negative.:  Fatigue, sleepiness , snoring, fragmented sleep due to nocturia.    How likely are you to doze in the following situations: 0 = not likely, 1 = slight chance, 2 = moderate chance, 3 = high chance   Sitting and Reading? Watching Television? Sitting inactive in a public place (theater or meeting)? As a passenger in a car for an hour without a break? Lying down in the afternoon when circumstances permit? Sitting and talking to someone? Sitting quietly after lunch without  alcohol? In a car, while stopped for a few minutes in traffic?   Total = 4/ 24 points   FSS endorsed at 16/ 63 points.   Social History   Socioeconomic History   Marital status: Married    Spouse name: Barth Kirks   Number of children: 3   Years of education: 12   Highest education level: Not on file  Occupational History   Occupation: retired  Tobacco Use   Smoking status: Former Smoker    Packs/day: 1.00    Years: 25.00    Pack years: 25.00    Types: Cigarettes    Quit date: 2010    Years since quitting: 11.6   Smokeless tobacco: Never Used  Building services engineer Use: Never used  Substance and Sexual Activity   Alcohol use: Not Currently     Alcohol/week: 6.0 - 10.0 standard drinks    Types: 6 - 10 Cans of beer per week    Comment: Daily, 01/03/20 none   Drug use: Never   Sexual activity: Not on file  Other Topics Concern   Not on file  Social History Narrative   Lives with wife   Social Determinants of Health   Financial Resource Strain:    Difficulty of Paying Living Expenses:   Food Insecurity:    Worried About Programme researcher, broadcasting/film/video in the Last Year:    Barista in the Last Year:   Transportation Needs:    Freight forwarder (Medical):    Lack of Transportation (Non-Medical):   Physical Activity:    Days of Exercise per Week:    Minutes of Exercise per Session:   Stress:    Feeling of Stress :   Social Connections:    Frequency of Communication with Friends and Family:    Frequency of Social Gatherings with Friends and Family:    Attends Religious Services:    Active Member of Clubs or Organizations:    Attends Engineer, structural:    Marital Status:     Family History  Problem Relation Age of Onset   CAD Father        MI in early 13s   Cancer Father    Heart failure Father    Dementia Mother    Healthy Brother     Past Medical History:  Diagnosis Date   Anxiety    Coronary artery disease    Former smoker    Hypertension    Mixed hyperlipidemia    TIA (transient ischemic attack)     Past Surgical History:  Procedure Laterality Date   CARDIAC CATHETERIZATION     CORONARY ARTERY BYPASS GRAFT N/A 11/07/2019   Procedure: CORONARY ARTERY BYPASS GRAFTING (CABG) x 2 WITH BILATERAL INTERNAL MAMMARY ARTERIES. LIMA TO LAD, RIMA TO DISTAL RCA;  Surgeon: Loreli Slot, MD;  Location: [facility] OR;  Service: Open Heart Surgery;  Laterality: N/A;   LEFT HEART CATH AND CORONARY ANGIOGRAPHY N/A 11/03/2019   Procedure: LEFT HEART CATH AND CORONARY ANGIOGRAPHY;  Surgeon: Elder Negus, MD;  Location: [facility];  Service: Cardiovascular;  Laterality:  N/A;   REPAIR EXTENSOR TENDON Right 04/07/2019   Procedure: REPAIR RIGHT THUMB EXTENSOR TENDON;  Surgeon: Betha Loa, MD;  Location: [facility];  Service: Orthopedics;  Laterality: Right;   TEE WITHOUT CARDIOVERSION N/A 11/07/2019   Procedure: Transesophageal Echocardiogram (Tee);  Surgeon: Loreli Slot, MD;  Location: [facility] OR;  Service: Open Heart Surgery;  Laterality: N/A;     Current Outpatient Medications on File Prior to Visit  Medication Sig Dispense Refill   allopurinol (ZYLOPRIM) 100 MG tablet Take 200 mg by mouth daily.      atorvastatin (LIPITOR) 40 MG tablet TAKE 1 TABLET (40 MG TOTAL) BY MOUTH DAILY AT 6 PM. 90 tablet 1   clopidogrel (PLAVIX) 75 MG tablet Take 1 tablet (75 mg total) by mouth daily. 30 tablet 3   colchicine 0.6 MG tablet Take 0.6 mg by mouth 2 (two) times daily.      diltiazem (CARDIZEM) 30 MG tablet Take 1 tablet (30 mg total) by mouth every 6 (six) hours as needed. 30 tablet 2   lisinopril (ZESTRIL) 5 MG tablet Take 1 tablet (5 mg total) by mouth daily. 90 tablet 1   Multiple Vitamin (MULTIVITAMIN) tablet Take 1 tablet by mouth daily.      Omega-3 Fatty Acids (OMEGA-3 FISH OIL) 1200 MG CAPS Take 2,400 mg by mouth at bedtime.      pantoprazole (PROTONIX) 40 MG tablet Take 1 tablet (40 mg total) by mouth daily. (Patient taking differently: Take 40 mg by mouth daily as needed (acid reflux). ) 60 tablet 2   predniSONE (DELTASONE) 5 MG tablet Take 5 mg by mouth daily with breakfast.     psyllium (METAMUCIL) 58.6 % packet Take 1 packet by mouth daily.     rivaroxaban (XARELTO) 20 MG TABS tablet Take 1 tablet (20 mg total) by mouth daily with supper. 30 tablet 0   zolpidem (AMBIEN) 5 MG tablet Take 2.5-5 mg by mouth at bedtime as needed for sleep.      No current facility-administered medications on file prior to visit.    Allergies  Allergen Reactions   Metoprolol Tartrate     Causes Gout   Oxycodone-Acetaminophen      hallucinations     Physical exam:  Today's Vitals   03/20/20 0943  BP: 122/85  Pulse: 81  Weight: 222 lb (100.7 kg)  Height: 5\' 9"  (1.753 m)   Body mass index is 32.78 kg/m.   Wt Readings from Last 3 Encounters:  03/20/20 222 lb (100.7 kg)  03/15/20 221 lb (100.2 kg)  03/02/20 (!) 222 lb (100.7 kg)     Ht Readings from Last 3 Encounters:  03/20/20 5\' 9"  (1.753 m)  03/15/20 5\' 9"  (1.753 m)  02/10/20 5\' 9"  (1.753 m)      General: The patient is awake, alert and appears not in acute distress. The patient is well groomed. Head: Normocephalic, atraumatic. Neck is supple. Mallampati 2,  neck circumference:17 inches . Nasal airflow congested.  Retrognathia is not seen.  Dental status: intact.  Cardiovascular:  Regular rate and cardiac rhythm by pulse,  without distended neck veins. Respiratory: Lungs are clear to auscultation.  Skin:  Without evidence of ankle edema, or rash. Trunk: The patient's posture is erect.   Neurologic exam : The patient is awake and alert, oriented to place and time.   Memory subjective described as intact.  Attention span & concentration ability appears normal.  Speech is fluent,  without  dysarthria, dysphonia or aphasia.  Mood and affect are appropriate.   Cranial nerves: no loss of smell or taste reported-   Pupils are equal and briskly reactive to light. Funduscopic exam deferred.   Extraocular movements in vertical and horizontal planes were intact and without nystagmus.  No Diplopia. Visual fields by finger perimetry are intact. Hearing was intact to soft voice and finger  rubbing.    Facial sensation intact to fine touch.  Facial motor strength is symmetric and tongue and uvula move midline.  Neck ROM : rotation, tilt and flexion extension were normal for age and shoulder shrug was symmetrical.    Motor exam:  Symmetric bulk, tone and ROM.   Normal tone without cog wheeling, symmetric grip strength . Wears a knee brace - on the right,  gout.    Sensory:  Fine touch and vibration were normal.  Proprioception tested in the upper extremities was normal.   Coordination: Rapid alternating movements in the fingers/hands were of normal speed.  The Finger-to-nose maneuver was intact without evidence of ataxia, dysmetria or tremor.   Gait and station: Patient could rise unassisted from a seated position, walked without assistive device.  Stance is of normal width/ base and the patient turned with 3 steps.  Toe and heel walk were deferred.  Deep tendon reflexes: in the  upper and lower extremities are symmetric and intact.  Babinski response was deferred .      I could hear from Dr. Sunday CornHendrickson's notes that the patient had a median sternotomy with extracorporal circulation coronary artery bypass grafting on 2 vessels using the left internal mammary artery to the left anterior descending artery and the free right internal mammary artery to the right distal coronary.  And LE a duplex on 2 April right the day after his heart attack had shown a 75 to 99% stenosis in the superficial femoral artery and posterior tibial artery appears occluded the left side there was a 75 to 99% stenosis of the superficial femoral artery at the posterior tibial artery here it appeared occluded as well.  So one of his cold diagnosis is peripheral arterial disease no chest coronary artery disease.   After spending a total time of  55 minutes face to face and additional time for physical and neurologic examination, review of laboratory studies,  personal review of imaging studies, reports and results of other testing and review of referral information / records as far as provided in visit, I have established the following assessments:  1)  Snoring improved with weight loss.  2)  Severe CAD - status post MI and Bypass, but after bypass he has had first documented atrial fibrillation-  3)  visual field floater -  Visual aura - for migraine.    My Plan is to  proceed with:  1) this patient has a lot of risk factors for OSA, including atrial fibrillation, CAD, I but improved BMI.  I will order an attended sleep study for this patient.  PS :  the patent is Covid vaccinated.   I would like to thank Dr Rosemary HolmsPatwardhan  and Elizabeth PalauAnderson, Teresa, Fnp [address] [place] [id number] for allowing me to meet with and to take care of this pleasant patient.   In short, Allen DerryDouglas Dulay is presenting with gout, atrial fibrillation, MI and CAD, PAD   I plan to follow up either personally or through our NP within 2-3  month.   CC: I will share my notes with  Dr Rosemary HolmsPatwardhan.   Electronically signed by: Melvyn Novasarmen Oracio Galen, MD 03/20/2020 9:45 AM  [facility] and [facility] certified by The [employer] of Sleep Medicine and Diplomate of the [employer] of [employer] Medicine. Board certified In Neurology through the ABPN, Fellow of the [employer] of Neu[employer]. Medical Director of [facility].

## 2020-03-21 ENCOUNTER — Other Ambulatory Visit: Payer: Self-pay

## 2020-03-21 ENCOUNTER — Encounter (HOSPITAL_COMMUNITY)
Admission: RE | Admit: 2020-03-21 | Discharge: 2020-03-21 | Disposition: A | Discharge status: Home / Self Care | Payer: BC Managed Care – PPO | Source: Ambulatory Visit | Attending: Cardiology | Admitting: Cardiology

## 2020-03-21 ENCOUNTER — Other Ambulatory Visit: Payer: Self-pay | Admitting: Orthopedic Surgery

## 2020-03-21 VITALS — Ht 68.5 in | Wt 222.0 lb

## 2020-03-21 DIAGNOSIS — I2111 ST elevation (STEMI) myocardial infarction involving right coronary artery: Secondary | ICD-10-CM

## 2020-03-21 DIAGNOSIS — R52 Pain, unspecified: Secondary | ICD-10-CM

## 2020-03-21 DIAGNOSIS — Z951 Presence of aortocoronary bypass graft: Secondary | ICD-10-CM | POA: Diagnosis not present

## 2020-03-21 DIAGNOSIS — R609 Edema, unspecified: Secondary | ICD-10-CM

## 2020-03-21 DIAGNOSIS — M25361 Other instability, right knee: Secondary | ICD-10-CM

## 2020-03-21 NOTE — Progress Notes (Addendum)
Discharge Progress Report  Patient Details  Name: Sean Flynn MRN: [id number] Date of Birth: May 22, 1966 Referring Provider:     [place] from 01/31/2020 in [facility]  Referring Provider [name],  Dr. Joya Gaskins       Number of Visits: 13  Reason for Discharge:  Patient reached a stable level of exercise. Patient independent in their exercise. Patient has met program and personal goals.  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 Joya Gaskins    Expected Discharge Date 03/30/20      Recumbant Bike   Level 2    RPM 60    Minutes 15    METs 3.5      Arm Ergometer   Level 2    Watts 5    Minutes 15      Prescription Details   Frequency (times per week) 3    Duration Progress to 30 minutes of continuous aerobic without signs/symptoms of physical distress      Intensity   THRR 40-80% of Max Heartrate 66-132    Ratings of Perceived Exertion 11-13    Perceived Dyspnea 0-4      Progression   Progression Continue progressive overload as per policy without signs/symptoms or physical distress.      Resistance Training   Training Prescription Yes    Weight 5    Reps 10-15           Discharge Exercise Prescription (Final Exercise Prescription Changes):  Exercise Prescription Changes - 03/23/20 0750      Response to Exercise   Blood Pressure (Admit) 106/60    Blood Pressure (Exercise) 122/72     Blood Pressure (Exit) 110/70    Heart Rate (Admit) 93 bpm    Heart Rate (Exercise) 102 bpm    Heart Rate (Exit) 84 bpm    Rating of Perceived Exertion (Exercise) 12    Perceived Dyspnea (Exercise) 0    Symptoms None    Comments Pt reporting chronic knee pain    Duration Continue with 30 min of aerobic exercise without signs/symptoms of physical distress.    Intensity THRR unchanged      Progression   Progression Continue to progress workloads to maintain intensity without signs/symptoms of physical distress.    Average METs 2.2      Resistance Training   Training Prescription Yes    Weight 5lbs    Reps 10-15    Time 10 Minutes      Interval Training   Interval Training No      Arm Ergometer   Level 2    Watts 5    Minutes 30      Home Exercise Plan   Plans to continue exercise at Home (comment)   Walking   Frequency Add 3 additional days to program exercise sessions.    Initial Home Exercises Provided 03/07/20  Functional Capacity:  6 Minute Walk    Row Name 01/31/20 1038 03/21/20 0959       6 Minute Walk   Phase Initial Discharge    Distance 1411 feet 1600 feet    Distance % Change -- 13.39 %    Distance Feet Change -- 189 ft    Walk Time 6 minutes 6 minutes    # of Rest Breaks 0 0    MPH 2.67 3.03    METS 3.45 4    RPE 12 12    Perceived Dyspnea  1 0    VO2 Peak 12.08 14    Symptoms Yes (comment) Yes (comment)    Comments RPD = 1 Right Knee Pain 2/10    Resting HR 94 bpm 98 bpm    Resting BP 104/68 104/64    Resting Oxygen Saturation  97 % --    Exercise Oxygen Saturation  during 6 min walk 99 % --    Max Ex. HR 110 bpm 107 bpm    Max Ex. BP 114/68 140/80    2 Minute Post BP 100/60 110/60           Psychological, QOL, Others - Outcomes: PHQ 2/9: Depression screen [facility] 2/9 03/21/2020 01/31/2020  Decreased Interest 0 0  Down, Depressed, Hopeless 0 0  PHQ - 2 Score 0 0    Quality of Life:  Quality of Life - 03/29/20 0736       Quality of Life Scores   Health/Function Pre 27.2 %    Health/Function Post 26.8 %    Health/Function % Change -1.47 %    Socioeconomic Pre 28.31 %    Socioeconomic Post 29.14 %    Socioeconomic % Change  2.93 %    Psych/Spiritual Pre 25.5 %    Psych/Spiritual Post 24 %    Psych/Spiritual % Change -5.88 %    Family Pre 30 %    Family Post 26.4 %    Family % Change -12 %    GLOBAL Pre 27.51 %    GLOBAL Post 26.65 %    GLOBAL % Change -3.13 %           Personal Goals: Goals established at orientation with interventions provided to work toward goal.  Personal Goals and Risk Factors at Admission - 01/31/20 1258      Core Components/Risk Factors/Patient Goals on Admission    Weight Management Yes;Obesity;Weight Loss    Intervention Weight Management: Develop a combined nutrition and exercise program designed to reach desired caloric intake, while maintaining appropriate intake of nutrient and fiber, sodium and fats, and appropriate energy expenditure required for the weight goal.;Weight Management: Provide education and appropriate resources to help participant work on and attain dietary goals.;Weight Management/Obesity: Establish reasonable short term and long term weight goals.;Obesity: Provide education and appropriate resources to help participant work on and attain dietary goals.    Admit Weight 226 lb 6.6 oz (102.7 kg)    Goal Weight: Long Term 200 lb (90.7 kg)    Expected Outcomes Short Term: Continue to assess and modify interventions until short term weight is achieved;Long Term: Adherence to nutrition and physical activity/exercise program aimed toward attainment of established weight goal;Weight Loss: Understanding of general recommendations for a balanced deficit meal plan, which promotes 1-2 lb weight loss per week and includes a negative energy balance of [phone] kcal/d;Understanding recommendations for meals to include 15-35% energy as protein, 25-35% energy from fat, 35-60%  energy from carbohydrates, less than  233m of dietary cholesterol, 20-35 gm of total fiber daily;Understanding of distribution of calorie intake throughout the day with the consumption of 4-5 meals/snacks    Hypertension Yes    Intervention Provide education on lifestyle modifcations including regular physical activity/exercise, weight management, moderate sodium restriction and increased consumption of fresh fruit, vegetables, and low fat dairy, alcohol moderation, and smoking cessation.;Monitor prescription use compliance.    Expected Outcomes Short Term: Continued assessment and intervention until BP is < 140/936mHG in hypertensive participants. < 130/8055mG in hypertensive participants with diabetes, heart failure or chronic kidney disease.;Long Term: Maintenance of blood pressure at goal levels.    Lipids Yes    Intervention Provide education and support for participant on nutrition & aerobic/resistive exercise along with prescribed medications to achieve LDL <70m4mDL >40mg74m Expected Outcomes Short Term: Participant states understanding of desired cholesterol values and is compliant with medications prescribed. Participant is following exercise prescription and nutrition guidelines.;Long Term: Cholesterol controlled with medications as prescribed, with individualized exercise RX and with personalized nutrition plan. Value goals: LDL < 70mg,3m > 40 mg.            Personal Goals Discharge:  Goals and Risk Factor Review    Row Name 02/23/20 1455 03/13/20 1546 04/05/20 1452         Core Components/Risk Factors/Patient Goals Review   Personal Goals Review Weight Management/Obesity;Lipids;Hypertension Weight Management/Obesity;Lipids;Hypertension Weight Management/Obesity;Lipids;Hypertension     Review [name] iMarden Noblef to a good start to exercise this week. [name]'s vital signs have been stable. [name] iMarden Nobleking a prednisone pack for gout [name] iMarden Nobleing okay with exercise. [name] hMarden Noblenly been able  to use the arm ergometer this week due to chronic knee pain and gout. [name] plans to follow up with his orthopedic provider [name] completed exercise at cardiac rehab on 03/23/20. [name]'s exercise was limited due to knee problems.     Expected Outcomes [name] wMarden Noblecontinue to participate in phase 2 cardiac rehab for exercise, nutrtion and lifestyle modifications [name] wMarden Noblecontinue to participate in phase 2 cardiac rehab for exercise, nutrtion and lifestyle modifications [name] wMarden Noblecontinue to exercise as able follow nutrition and lifestyle modifications upon completion of phase 2 cardiac rehab.            Exercise Goals and Review:  Exercise Goals    Row Name 01/31/20 1307             Exercise Goals   Increase Physical Activity Yes       Intervention Provide advice, education, support and counseling about physical activity/exercise needs.;Develop an individualized exercise prescription for aerobic and resistive training based on initial evaluation findings, risk stratification, comorbidities and participant's personal goals.       Expected Outcomes Short Term: Attend rehab on a regular basis to increase amount of physical activity.;Long Term: Add in home exercise to make exercise part of routine and to increase amount of physical activity.;Long Term: Exercising regularly at least 3-5 days a week.       Increase Strength and Stamina Yes       Intervention Provide advice, education, support and counseling about physical activity/exercise needs.;Develop an individualized exercise prescription for aerobic and resistive training based on initial evaluation findings, risk stratification, comorbidities and participant's personal goals.       Expected Outcomes Short Term: Increase workloads from initial exercise prescription for resistance, speed, and METs.;Short Term: Perform resistance training exercises routinely during rehab and add in resistance training at  home;Long Term: Improve cardiorespiratory  fitness, muscular endurance and strength as measured by increased METs and functional capacity (6MWT)       Able to understand and use rate of perceived exertion (RPE) scale Yes       Intervention Provide education and explanation on how to use RPE scale       Expected Outcomes Short Term: Able to use RPE daily in rehab to express subjective intensity level;Long Term:  Able to use RPE to guide intensity level when exercising independently       Knowledge and understanding of Target Heart Rate Range (THRR) Yes       Intervention Provide education and explanation of THRR including how the numbers were predicted and where they are located for reference       Expected Outcomes Short Term: Able to state/look up THRR;Short Term: Able to use daily as guideline for intensity in rehab;Long Term: Able to use THRR to govern intensity when exercising independently       Able to check pulse independently Yes       Intervention Provide education and demonstration on how to check pulse in carotid and radial arteries.;Review the importance of being able to check your own pulse for safety during independent exercise       Expected Outcomes Short Term: Able to explain why pulse checking is important during independent exercise;Long Term: Able to check pulse independently and accurately       Understanding of Exercise Prescription Yes       Intervention Provide education, explanation, and written materials on patient's individual exercise prescription       Expected Outcomes Short Term: Able to explain program exercise prescription;Long Term: Able to explain home exercise prescription to exercise independently              Exercise Goals Re-Evaluation:  Exercise Goals Re-Evaluation    Row Name 02/20/20 1053 03/05/20 1220 03/29/20 0754         Exercise Goal Re-Evaluation   Exercise Goals Review Increase Physical Activity;Increase Strength and Stamina Increase Physical Activity;Increase Strength and Stamina;Able  to understand and use rate of perceived exertion (RPE) scale;Knowledge and understanding of Target Heart Rate Range (THRR);Able to check pulse independently;Understanding of Exercise Prescription Increase Physical Activity;Increase Strength and Stamina;Able to understand and use rate of perceived exertion (RPE) scale;Knowledge and understanding of Target Heart Rate Range (THRR);Able to check pulse independently;Understanding of Exercise Prescription     Comments Pt's first day of exercise. Pt responded well to exercise prescription. Able to exercise 30 minutes with no difficulty. Will continue to monitor. Reviewed home exercise Rx with patient. Pt voices walking daily for 45 minutes. Pt completed 13 sessions of Cardiac Rehab. Pt increased his functional capacity by 13.39%, increased post 1mt distance by 1847f Pt wanted to start weight loss, pt lost 5lbs while in rehab.     Expected Outcomes Pt will continue to increase strength, stamina and cardiovascular fitness. Pt will continue to walk 7x/wk for 45 minutes. Will abide by the guidelines outlined in the Exercise Rx. Pt will continue exercising after cardiac rehab by walking at home 5-7 days a week for 45 minutes, as long as chronic knee pain permits.            Nutrition & Weight - Outcomes:  Pre Biometrics - 01/31/20 1234      Pre Biometrics   Waist Circumference 44 inches    Hip Circumference 42.5 inches    Waist to Hip Ratio 1.04 %  Triceps Skinfold 8 mm    % Body Fat 28.8 %    Grip Strength 49 kg    Flexibility 13.5 in    Single Leg Stand 54 seconds           Post Biometrics - 03/21/20 1000       Post  Biometrics   Height 5' 8.5" (1.74 m)    Weight 100.7 kg    Waist Circumference 43 inches    Hip Circumference 41 inches    Waist to Hip Ratio 1.05 %    BMI (Calculated) 33.26    Triceps Skinfold 6 mm    % Body Fat 26.9 %    Grip Strength 55 kg    Flexibility 14.5 in    Single Leg Stand 12.6 seconds            Nutrition:  Nutrition Therapy & Goals - 03/02/20 1014      Nutrition Therapy   Diet Heart Healthy      Personal Nutrition Goals   Nutrition Goal Pt to identify food quantities necessary to achieve weight loss of 6-24 lb at graduation from cardiac rehab.    Personal Goal #2 Pt to build a healthy plate including vegetables, fruits, whole grains, and low-fat dairy products in a heart healthy meal plan.      Intervention Plan   Intervention Nutrition handout(s) given to patient.;Prescribe, educate and counsel regarding individualized specific dietary modifications aiming towards targeted core components such as weight, hypertension, lipid management, diabetes, heart failure and other comorbidities.    Expected Outcomes Short Term Goal: Understand basic principles of dietary content, such as calories, fat, sodium, cholesterol and nutrients.           Nutrition Discharge:  Nutrition Assessments - 03/02/20 1015      MEDFICTS Scores   Pre Score 9           Education Questionnaire Score:  Knowledge Questionnaire Score - 03/29/20 0736      Knowledge Questionnaire Score   Pre Score 21/24    Post Score 23/24           Goals reviewed with patient; copy given to patient.Pt graduated from cardiac rehab program on 03/23/20 with completion of 13 exercise sessions in Phase II. Pt maintained good attendance and progressed nicely during his participation in rehab as evidenced by increased MET level.[name] increased his post exercise walk test by 189 feet.   Medication list reconciled. Repeat  PHQ score-  0.  Pt has made significant lifestyle changes and should be commended for his success. Pt feels he has achieved his goals during cardiac rehab. [name]'s exercise was limited die to his chronic knee pain and gout flare ups.  Pt plans to continue exercise by walking as tolerated.Barnet Pall, RN,BSN 04/05/2020 2:59 PM

## 2020-03-23 ENCOUNTER — Other Ambulatory Visit: Payer: Self-pay | Admitting: Cardiology

## 2020-03-23 ENCOUNTER — Encounter (HOSPITAL_COMMUNITY)
Admission: RE | Admit: 2020-03-23 | Discharge: 2020-03-23 | Disposition: A | Discharge status: Home / Self Care | Payer: BC Managed Care – PPO | Source: Ambulatory Visit | Attending: Cardiology | Admitting: Cardiology

## 2020-03-23 ENCOUNTER — Other Ambulatory Visit: Payer: Self-pay

## 2020-03-23 DIAGNOSIS — I2111 ST elevation (STEMI) myocardial infarction involving right coronary artery: Secondary | ICD-10-CM

## 2020-03-23 DIAGNOSIS — Z951 Presence of aortocoronary bypass graft: Secondary | ICD-10-CM | POA: Diagnosis not present

## 2020-03-26 ENCOUNTER — Encounter (HOSPITAL_COMMUNITY): Payer: BC Managed Care – PPO

## 2020-03-28 ENCOUNTER — Encounter (HOSPITAL_COMMUNITY): Payer: BC Managed Care – PPO

## 2020-03-30 ENCOUNTER — Encounter (HOSPITAL_COMMUNITY): Payer: BC Managed Care – PPO

## 2020-04-07 ENCOUNTER — Other Ambulatory Visit: Payer: Self-pay | Admitting: Cardiology

## 2020-04-07 DIAGNOSIS — I739 Peripheral vascular disease, unspecified: Secondary | ICD-10-CM

## 2020-04-11 ENCOUNTER — Other Ambulatory Visit: Payer: BC Managed Care – PPO

## 2020-04-11 DIAGNOSIS — M9903 Segmental and somatic dysfunction of lumbar region: Secondary | ICD-10-CM | POA: Diagnosis not present

## 2020-04-11 DIAGNOSIS — M5136 Other intervertebral disc degeneration, lumbar region: Secondary | ICD-10-CM | POA: Diagnosis not present

## 2020-04-12 ENCOUNTER — Ambulatory Visit
Admission: RE | Admit: 2020-04-12 | Discharge: 2020-04-12 | Disposition: A | Discharge status: Home / Self Care | Payer: BC Managed Care – PPO | Source: Ambulatory Visit | Attending: Orthopedic Surgery | Admitting: Orthopedic Surgery

## 2020-04-12 ENCOUNTER — Other Ambulatory Visit: Payer: Self-pay

## 2020-04-12 DIAGNOSIS — M1711 Unilateral primary osteoarthritis, right knee: Secondary | ICD-10-CM | POA: Diagnosis not present

## 2020-04-12 DIAGNOSIS — M25461 Effusion, right knee: Secondary | ICD-10-CM | POA: Diagnosis not present

## 2020-04-12 DIAGNOSIS — R600 Localized edema: Secondary | ICD-10-CM | POA: Diagnosis not present

## 2020-04-12 DIAGNOSIS — R609 Edema, unspecified: Secondary | ICD-10-CM

## 2020-04-12 DIAGNOSIS — M25361 Other instability, right knee: Secondary | ICD-10-CM

## 2020-04-12 DIAGNOSIS — R52 Pain, unspecified: Secondary | ICD-10-CM

## 2020-04-16 DIAGNOSIS — M6588 Other synovitis and tenosynovitis, other site: Secondary | ICD-10-CM | POA: Diagnosis not present

## 2020-04-16 DIAGNOSIS — M1711 Unilateral primary osteoarthritis, right knee: Secondary | ICD-10-CM | POA: Diagnosis not present

## 2020-04-19 DIAGNOSIS — M25471 Effusion, right ankle: Secondary | ICD-10-CM | POA: Diagnosis not present

## 2020-04-19 DIAGNOSIS — M109 Gout, unspecified: Secondary | ICD-10-CM | POA: Diagnosis not present

## 2020-04-19 DIAGNOSIS — I2510 Atherosclerotic heart disease of native coronary artery without angina pectoris: Secondary | ICD-10-CM | POA: Diagnosis not present

## 2020-05-07 ENCOUNTER — Ambulatory Visit (INDEPENDENT_AMBULATORY_CARE_PROVIDER_SITE_OTHER): Payer: BC Managed Care – PPO | Admitting: Neurology

## 2020-05-07 ENCOUNTER — Other Ambulatory Visit: Payer: Self-pay

## 2020-05-07 DIAGNOSIS — I739 Peripheral vascular disease, unspecified: Secondary | ICD-10-CM

## 2020-05-07 DIAGNOSIS — G4733 Obstructive sleep apnea (adult) (pediatric): Secondary | ICD-10-CM

## 2020-05-07 DIAGNOSIS — Z8679 Personal history of other diseases of the circulatory system: Secondary | ICD-10-CM

## 2020-05-07 DIAGNOSIS — I25708 Atherosclerosis of coronary artery bypass graft(s), unspecified, with other forms of angina pectoris: Secondary | ICD-10-CM

## 2020-05-07 DIAGNOSIS — I249 Acute ischemic heart disease, unspecified: Secondary | ICD-10-CM

## 2020-05-07 DIAGNOSIS — I700 Atherosclerosis of aorta: Secondary | ICD-10-CM

## 2020-05-07 NOTE — Telephone Encounter (Signed)
Front desk, Please provide names of practices we work with.  Thanks MJP

## 2020-05-07 NOTE — Telephone Encounter (Signed)
From pt

## 2020-05-10 DIAGNOSIS — M5136 Other intervertebral disc degeneration, lumbar region: Secondary | ICD-10-CM | POA: Diagnosis not present

## 2020-05-10 DIAGNOSIS — M9903 Segmental and somatic dysfunction of lumbar region: Secondary | ICD-10-CM | POA: Diagnosis not present

## 2020-05-11 DIAGNOSIS — Z8679 Personal history of other diseases of the circulatory system: Secondary | ICD-10-CM | POA: Insufficient documentation

## 2020-05-11 NOTE — Procedures (Signed)
Sleep Study Report   Patient Information     First Name: Sean Last Name: Flynn ID: [id number]  Birth Date: 2066-03-10 Age: 54 Gender: Male  Referring Provider: Dr. Rosemary Holms BMI: 33.0 (W=222 lbs, H=5' 9'')  Neck Circ.:  17 '' Epworth:  4/24   Sleep Study Information    Study Date: 05/07/20 S/H/A Version: [id number] / [id number] / 79  History:    Sean Flynn is a 54 y.o. year old Caucasian male patient of [place] descent and seen here in a consultation requested by Dr. Rosemary Holms. Chief concern according to patient: I had a heart attack on April 1st and on the following 5th, I underwent 2 vessel bypass - And July 1st, I had atrial fibrillation" and " I lost weight since then and stopped snoring; but my cardiologist is concerned about apnea. My heart CT was a "5000 score for calcium burden", or cholesterol burden". ". Cardiac monitor just finished negative the week prior. Sean Flynn has a medical history of Anxiety, Coronary artery disease, Former smoker, Hypertension, Mixed hyperlipidemia, now atrial fibrillation and MI, TIA (transient ischemic attack).  Summary & Diagnosis:     This HST revealed only minimal apnea, the AHI was 5.0/h which would be considered in normal range- During REM sleep there was a clear exacerbation of the REM AHI to 11/h and louder snoring was associated. Unfortunately, there were no positional related data provided.   Recommendations:     The patient reported already having lost weight and reduced snoring has been his clinical evidence of success. I would not feel the need for added CPAP therapy, but should the BMI increase again, these data will change. I like for the patient to continue with weight loss and exercise, and a reduction of caffeine and alcohol intake.   Should the weight increase to over 230 pounds, a repeat study would be needed.   Interpreting Physician: Melvyn Novas, MD                  Sleep Summary  Oxygen  Saturation Statistics   Start Study Time: End Study Time: Total Recording Time:  9:25:40 PM 6:31:16 AM 9 h, 5 min  Total Sleep Time % REM of Sleep Time:  7 h, 29 min  17.0    Mean: 94 Minimum: 80 Maximum: 98  Mean of Desaturations Nadirs (%):   91  Oxygen Desaturation. %:   4-9 10-20 >20 Total  Events Number Total    11  1 91.7 8.3  0 0.0  12 100.0  Oxygen Saturation: <90 <=88 <85 <80 <70  Duration (minutes): Sleep % 0.1 0.0  0.1 0.1  0.0 0.0 0.0 0.0 0.0 0.0     Respiratory Indices      Total Events REM NREM All Night  pRDI: pAHI 3%:  65  37 17.3 11.0 6.9 3.7 8.7 5.0  ODI 4%: pAHI 4%:  12 12 4.7 1.0 1.6        Pulse Rate Statistics during Sleep (BPM)      Mean: 68 Minimum: 42 Maximum: 108    Indices are calculated using technically valid sleep time of 7 h, 28 min.        5              15                    30         pAHI=5.0  Mild              Moderate                    Severe           PAT  Respiratory Events                                                                                      Excluded   periods        6  70  80  90  100          Oxygen Saturation: / Pulse Rate (BPM) / PAT Amplitude  SaO2 (%)        40  60  80  100  120  140     Pulse Rate (BPM)         0  200  400  600  800  1000  1200      PAT Amp                         21:2  5   21:5  5   22:2  5   22:5  5   23:2  5   23:5  5  0  0:25  0  0:55  0  1:25  0  1:55  0  2:25  0  2:55  0  3:25  0  3:55  0  4:25  0  4:55  0  5:25  0  5:55  0  6:25      Wake / Sleep stages   L Sleep   D Sleep   Wake   REM   Sleep Stages Chart

## 2020-05-11 NOTE — Progress Notes (Signed)
Summary & Diagnosis:   This HST revealed only minimal apnea, the AHI was 5.0/h which  would be considered in normal range- During REM sleep there was a  clear exacerbation of the REM AHI to 11/h and louder snoring was  associated. Unfortunately, there were no positional related data  provided.   Recommendations:    The patient reported already having lost weight and reduced  snoring has been his clinical evidence of success.  I would not feel the need for added CPAP therapy, but should the  BMI increase again, these data will change. I like for the  patient to continue with weight loss and exercise, and a  reduction of caffeine and alcohol intake.   Should the weight increase to over 230 pounds, a repeat study  would be advised.   Interpreting Physician: Melvyn Novas, MD

## 2020-05-14 ENCOUNTER — Telehealth: Payer: Self-pay | Admitting: Neurology

## 2020-05-14 DIAGNOSIS — G8929 Other chronic pain: Secondary | ICD-10-CM | POA: Diagnosis not present

## 2020-05-14 DIAGNOSIS — M25561 Pain in right knee: Secondary | ICD-10-CM | POA: Diagnosis not present

## 2020-05-14 NOTE — Telephone Encounter (Signed)
-----   Message from Melvyn Novas, MD sent at 05/11/2020 11:54 AM EDT ----- Summary & Diagnosis:   This HST revealed only minimal apnea, the AHI was 5.0/h which  would be considered in normal range- During REM sleep there was a  clear exacerbation of the REM AHI to 11/h and louder snoring was  associated. Unfortunately, there were no positional related data  provided.   Recommendations:    The patient reported already having lost weight and reduced  snoring has been his clinical evidence of success.  I would not feel the need for added CPAP therapy, but should the  BMI increase again, these data will change. I like for the  patient to continue with weight loss and exercise, and a  reduction of caffeine and alcohol intake.   Should the weight increase to over 230 pounds, a repeat study  would be advised.   Interpreting Physician: Melvyn Novas, MD

## 2020-05-14 NOTE — Telephone Encounter (Signed)
Called the patient and reviewed the sleep study findings. There was very minimal apnea that was present. Advised that based off this study, Dr Vickey Huger didn't feel the need to start CPAP therapy. Advised the patient to continue to exercise and work on loosing weight. Pt verbalized understanding. Pt had no questions at this time but was encouraged to call back if questions arise.

## 2020-05-23 DIAGNOSIS — I25708 Atherosclerosis of coronary artery bypass graft(s), unspecified, with other forms of angina pectoris: Secondary | ICD-10-CM | POA: Diagnosis not present

## 2020-05-23 DIAGNOSIS — E785 Hyperlipidemia, unspecified: Secondary | ICD-10-CM | POA: Diagnosis not present

## 2020-05-23 DIAGNOSIS — I739 Peripheral vascular disease, unspecified: Secondary | ICD-10-CM | POA: Diagnosis not present

## 2020-05-23 DIAGNOSIS — I10 Essential (primary) hypertension: Secondary | ICD-10-CM | POA: Diagnosis not present

## 2020-05-28 NOTE — Telephone Encounter (Signed)
Pantoprazole is protective for stomach, as you are on plavix. Okay not to take it everyday, if you do not have any GERD, stomach irritation symptoms.  Thanks MJP

## 2020-05-28 NOTE — Telephone Encounter (Signed)
From patient.

## 2020-05-30 DIAGNOSIS — M9903 Segmental and somatic dysfunction of lumbar region: Secondary | ICD-10-CM | POA: Diagnosis not present

## 2020-05-30 DIAGNOSIS — M5136 Other intervertebral disc degeneration, lumbar region: Secondary | ICD-10-CM | POA: Diagnosis not present

## 2020-06-19 DIAGNOSIS — I25708 Atherosclerosis of coronary artery bypass graft(s), unspecified, with other forms of angina pectoris: Secondary | ICD-10-CM | POA: Diagnosis not present

## 2020-06-19 DIAGNOSIS — I10 Essential (primary) hypertension: Secondary | ICD-10-CM | POA: Diagnosis not present

## 2020-06-19 DIAGNOSIS — I482 Chronic atrial fibrillation, unspecified: Secondary | ICD-10-CM | POA: Diagnosis not present

## 2020-06-19 DIAGNOSIS — Z23 Encounter for immunization: Secondary | ICD-10-CM | POA: Diagnosis not present

## 2020-06-20 DIAGNOSIS — M5136 Other intervertebral disc degeneration, lumbar region: Secondary | ICD-10-CM | POA: Diagnosis not present

## 2020-06-20 DIAGNOSIS — M9903 Segmental and somatic dysfunction of lumbar region: Secondary | ICD-10-CM | POA: Diagnosis not present

## 2020-06-25 ENCOUNTER — Other Ambulatory Visit: Payer: Self-pay | Admitting: Cardiology

## 2020-07-10 ENCOUNTER — Other Ambulatory Visit: Payer: Self-pay | Admitting: Cardiology

## 2020-07-10 DIAGNOSIS — K219 Gastro-esophageal reflux disease without esophagitis: Secondary | ICD-10-CM

## 2020-07-11 DIAGNOSIS — M9903 Segmental and somatic dysfunction of lumbar region: Secondary | ICD-10-CM | POA: Diagnosis not present

## 2020-07-11 DIAGNOSIS — M5136 Other intervertebral disc degeneration, lumbar region: Secondary | ICD-10-CM | POA: Diagnosis not present

## 2020-07-13 ENCOUNTER — Encounter: Payer: Self-pay | Admitting: Cardiology

## 2020-07-13 ENCOUNTER — Other Ambulatory Visit: Payer: Self-pay

## 2020-07-13 ENCOUNTER — Ambulatory Visit: Payer: BC Managed Care – PPO | Admitting: Cardiology

## 2020-07-13 VITALS — BP 122/78 | HR 78 | Resp 16 | Ht 68.0 in | Wt 227.0 lb

## 2020-07-13 DIAGNOSIS — I25708 Atherosclerosis of coronary artery bypass graft(s), unspecified, with other forms of angina pectoris: Secondary | ICD-10-CM | POA: Diagnosis not present

## 2020-07-13 DIAGNOSIS — I480 Paroxysmal atrial fibrillation: Secondary | ICD-10-CM | POA: Diagnosis not present

## 2020-07-13 DIAGNOSIS — I739 Peripheral vascular disease, unspecified: Secondary | ICD-10-CM | POA: Diagnosis not present

## 2020-07-13 DIAGNOSIS — I471 Supraventricular tachycardia: Secondary | ICD-10-CM

## 2020-07-13 NOTE — Progress Notes (Signed)
Follow up visit  Subjective:   Sean Flynn, male    DOB: 1965-09-20, 54 y.o.   MRN: [id number]    Chief Complaint  Patient presents with  . Coronary Artery Disease  . PAD  . Follow-up     54 y.o. Caucasian male with hypertension, hyperlipidemia, CAD s/p CABGX2 (LIMA-LAD, RIMA-RCA 10/2019), PAF (RVR episode 02/2019),  PAD, former smoker, h/o EtOH overuse, PAD, gout, migraine.  Patient has had occasional episodes of palpitations, improved with diltiazem.  He is walking up to 3 miles every day without any symptoms of chest pain or shortness of breath.  He has claudication when he initially starts walking, but improves with further walking.  Current Outpatient Medications on File Prior to Visit  Medication Sig Dispense Refill  . allopurinol (ZYLOPRIM) 100 MG tablet Take 200 mg by mouth daily.     Marland Kitchen atorvastatin (LIPITOR) 40 MG tablet TAKE 1 TABLET BY MOUTH EVERY DAY AT 6PM 30 tablet 5  . clopidogrel (PLAVIX) 75 MG tablet TAKE 1 TABLET BY MOUTH EVERY DAY 90 tablet 1  . colchicine 0.6 MG tablet Take 0.6 mg by mouth 2 (two) times daily.     Marland Kitchen diltiazem (CARDIZEM) 30 MG tablet Take 1 tablet (30 mg total) by mouth every 6 (six) hours as needed. 30 tablet 2  . lisinopril (ZESTRIL) 5 MG tablet Take 1 tablet (5 mg total) by mouth daily. 90 tablet 1  . Multiple Vitamin (MULTIVITAMIN) tablet Take 1 tablet by mouth daily.     . Omega-3 Fatty Acids (OMEGA-3 FISH OIL) 1200 MG CAPS Take 2,400 mg by mouth at bedtime.     . pantoprazole (PROTONIX) 40 MG tablet Take 1 tablet (40 mg total) by mouth daily as needed (acid reflux). 30 tablet 5  . predniSONE (DELTASONE) 5 MG tablet Take 5 mg by mouth daily with breakfast.    . psyllium (METAMUCIL) 58.6 % packet Take 1 packet by mouth daily.    Alveda Reasons 20 MG TABS tablet TAKE 1 TABLET (20 MG TOTAL) BY MOUTH DAILY WITH SUPPER. 30 tablet 6  . zolpidem (AMBIEN) 5 MG tablet Take 2.5-5 mg by mouth at bedtime as needed for sleep.      No current  facility-administered medications on file prior to visit.    Cardiovascular & other pertient studies:  EKG 07/13/2020: Sinus rhythm 78 bpm  Low voltage in precordial leads Nonspecific ST-T changes  8 day telemetry 02/10/2020 - 02/19/2020: Dominant rhythm: Sinus. HR 53-197 bpm. Avg HR 81 bpm. One 4 beat NSVT. 17 SVT episodes, fastest and longest at 197 bpm for 21 beats VE, SVE <1% No atrial fibrillation/atrial flutter/high grade AV block, sinus pause >3sec noted. Patient triggered events: 3 correlate with sinus rhythm    EKG 02/10/2020: Sinus rhythm 81 bpm Normal EKG  EKG 02/02/2020: A. fib with RVR 160 bpm.  Diffuse ST-T changes, consider ischemia  EKG 12/23/2019:  Sinus rhythm 69 bpm  Nonspecific T-abnormality inferior leads  MRI brain 12/16/2019: 1. No acute intracranial abnormality. 2. Advanced atrophy and findings of chronic small vessel disease.  Op note 11/07/2019 (Dr. Roxan Hockey): 1.  Median sternotomy.  2.  Extracorporeal circulation 3.  Coronary artery bypass grafting x 2      Left internal mammary artery to left anterior descending,      Free right internal mammary artery to distal right coronary.  LEA duplex 11/04/2019: Patent distal abdominal aorta and bilateral iliac arteries without  evidence of significant stenosis.  Right: 75-99% stenosis noted  in the superficial femoral artery. Posterior  tibial artery appears occluded.  Left: 75-99% stenosis noted in the superficial femoral artery. Posterior  tibial artery appears occluded.   Echocardiogram 11/04/2019: 1. Mild LVH. Normal LV size. Mild global hypokinesis. LVEF 45-50%. Normal  diastolic function.  2. Normal RV size and function.  3. No significant valvular abnormality.  4. Normal right atrial pressure.  Precharting  Coronary angiogram 11/03/2019: LM: Normal LAD: Severe ostial to mid calcification with long 50-75% calcified lesion upto mid LAD. Distal vessel is spared of calcification, with excellent  distal surgical target.  LCx: Mid calcific 50% stenosis. RCA: Severely calcified prox CTO with a narrow antegrade collateral, adequate to maintain TIMI III flow at rest, follwowed by log mid 60-80% long calcified lesion. Distal vessel is spared of calcification, with excellent distal surgical target.   LVEDP 20 mmHg   Recent labs: 02/02/2020: Glucose 174, BUN/Cr 7/0.95. EGFR >60. Na/K 133/4.3.  H/H 14/44. MCV 85. Platelets 229  12/15/2019: Glucose 112, BUN/Cr 10/0.85. EGFR 60. Na/K 135/4.8. Chloride 96. Rest of the CMP normal H/H 14/42. Platelets 294 Chol 144, TG 317, HDL 54, LDL 27  11/10/2019: Glucose 122, BUN/Cr 17/1.09. EGFR >60. Na/K 136/3.9. Rest of the CMP normal H/H 10/30. MCV 95. Platelets 242 HbA1C 5.5% Chol 144, TG 317, HDL 54, LDL 27 Lipoprotein (a) 213, elevated   Review of Systems  Cardiovascular: Positive for claudication. Negative for chest pain, dyspnea on exertion, leg swelling, palpitations and syncope.       Vitals:   07/13/20 1011  BP: 122/78  Pulse: 78  Resp: 16  SpO2: 97%     Body mass index is 34.52 kg/m. Filed Weights   07/13/20 1011  Weight: 227 lb (103 kg)     Objective:   Physical Exam Vitals and nursing note reviewed.  Constitutional:      Appearance: He is well-developed.  Neck:     Vascular: No JVD.  Cardiovascular:     Rate and Rhythm: Normal rate and regular rhythm.     Pulses:          Femoral pulses are 2+ on the right side and 2+ on the left side.      Popliteal pulses are 0 on the right side and 1+ on the left side.       Dorsalis pedis pulses are 0 on the right side and 0 on the left side.       Posterior tibial pulses are 0 on the right side and 0 on the left side.     Heart sounds: Normal heart sounds. No murmur heard.   Pulmonary:     Effort: Pulmonary effort is normal.     Breath sounds: Normal breath sounds. No wheezing or rales.         Assessment & Recommendations:   54 y.o. Caucasian male with  hypertension, hyperlipidemia, CAD s/p CABGX2 (LIMA-LAD, RIMA-RCA 10/2019), PAF (RVR episode 02/2019),  PAD, former smoker, h/o EtOH overuse, PAD, gout, migraine.   PAF/SVT: RVR episode on 02/02/2020 with self conversion to Sinus rhythm. Occasional episodes controlled with as needed diltiazem 30 mg. CHA2DS2VASc score 3, annual stroke risk 3% Continue Xarelto 20 mg daily. Intolerant to metoprolol, causes gout flareupiazem.  CAD without angina: S/p CABGX2 (LIMA-LAD, RIMA-RCA). Continue lisinopril 5 mg daily, lipitor 80 mg daily.  Not on beta blocker due severe gout attacks.   In light of ACS presentation prior to CABG,, continue Plavix 75 mg daily till 11/2020.   PAD:  Non-lifestyle limiting b/l calf claudication. Suspect b/l SFA stenoses. Continue medical management, including regular walking. Currently on Plavix 75 mg daily, and Xarelto 20 mg daily.  Could continue Xarelto alone after completion of 1 year of Plavix following ACS.   Elevated lipoprotein (a): Conitnue statin for now. Eligible enroll in HORIZON trial. He wants to hold off participation for now  F/u in 6 months   [place], MD [facility] [place] Pager: [phone] Office: [phone]

## 2020-07-19 DIAGNOSIS — M109 Gout, unspecified: Secondary | ICD-10-CM | POA: Diagnosis not present

## 2020-07-19 DIAGNOSIS — M25471 Effusion, right ankle: Secondary | ICD-10-CM | POA: Diagnosis not present

## 2020-07-19 DIAGNOSIS — I2510 Atherosclerotic heart disease of native coronary artery without angina pectoris: Secondary | ICD-10-CM | POA: Diagnosis not present

## 2020-07-20 DIAGNOSIS — I2510 Atherosclerotic heart disease of native coronary artery without angina pectoris: Secondary | ICD-10-CM | POA: Diagnosis not present

## 2020-07-20 DIAGNOSIS — I480 Paroxysmal atrial fibrillation: Secondary | ICD-10-CM | POA: Diagnosis not present

## 2020-07-20 DIAGNOSIS — E785 Hyperlipidemia, unspecified: Secondary | ICD-10-CM | POA: Diagnosis not present

## 2020-07-20 DIAGNOSIS — I10 Essential (primary) hypertension: Secondary | ICD-10-CM | POA: Diagnosis not present

## 2020-07-20 DIAGNOSIS — I255 Ischemic cardiomyopathy: Secondary | ICD-10-CM | POA: Diagnosis not present

## 2020-07-24 DIAGNOSIS — E785 Hyperlipidemia, unspecified: Secondary | ICD-10-CM | POA: Diagnosis not present

## 2020-07-24 DIAGNOSIS — I2510 Atherosclerotic heart disease of native coronary artery without angina pectoris: Secondary | ICD-10-CM | POA: Diagnosis not present

## 2020-07-24 DIAGNOSIS — I480 Paroxysmal atrial fibrillation: Secondary | ICD-10-CM | POA: Diagnosis not present

## 2020-07-24 DIAGNOSIS — I255 Ischemic cardiomyopathy: Secondary | ICD-10-CM | POA: Diagnosis not present

## 2020-08-01 DIAGNOSIS — M5136 Other intervertebral disc degeneration, lumbar region: Secondary | ICD-10-CM | POA: Diagnosis not present

## 2020-08-01 DIAGNOSIS — M9903 Segmental and somatic dysfunction of lumbar region: Secondary | ICD-10-CM | POA: Diagnosis not present

## 2020-08-07 DIAGNOSIS — M9903 Segmental and somatic dysfunction of lumbar region: Secondary | ICD-10-CM | POA: Diagnosis not present

## 2020-08-07 DIAGNOSIS — M5136 Other intervertebral disc degeneration, lumbar region: Secondary | ICD-10-CM | POA: Diagnosis not present

## 2020-08-08 DIAGNOSIS — Z03818 Encounter for observation for suspected exposure to other biological agents ruled out: Secondary | ICD-10-CM | POA: Diagnosis not present

## 2020-08-08 DIAGNOSIS — Z20822 Contact with and (suspected) exposure to covid-19: Secondary | ICD-10-CM | POA: Diagnosis not present

## 2020-08-25 ENCOUNTER — Other Ambulatory Visit: Payer: Self-pay | Admitting: Cardiology

## 2020-09-05 DIAGNOSIS — M5136 Other intervertebral disc degeneration, lumbar region: Secondary | ICD-10-CM | POA: Diagnosis not present

## 2020-09-05 DIAGNOSIS — M9903 Segmental and somatic dysfunction of lumbar region: Secondary | ICD-10-CM | POA: Diagnosis not present

## 2020-09-26 DIAGNOSIS — M5136 Other intervertebral disc degeneration, lumbar region: Secondary | ICD-10-CM | POA: Diagnosis not present

## 2020-09-26 DIAGNOSIS — M9903 Segmental and somatic dysfunction of lumbar region: Secondary | ICD-10-CM | POA: Diagnosis not present

## 2020-10-02 DIAGNOSIS — R202 Paresthesia of skin: Secondary | ICD-10-CM | POA: Diagnosis not present

## 2020-10-02 DIAGNOSIS — I480 Paroxysmal atrial fibrillation: Secondary | ICD-10-CM | POA: Diagnosis not present

## 2020-10-02 DIAGNOSIS — K219 Gastro-esophageal reflux disease without esophagitis: Secondary | ICD-10-CM | POA: Diagnosis not present

## 2020-10-02 DIAGNOSIS — Z7901 Long term (current) use of anticoagulants: Secondary | ICD-10-CM | POA: Insufficient documentation

## 2020-10-02 DIAGNOSIS — Z23 Encounter for immunization: Secondary | ICD-10-CM | POA: Diagnosis not present

## 2020-10-02 DIAGNOSIS — M79609 Pain in unspecified limb: Secondary | ICD-10-CM | POA: Diagnosis not present

## 2020-10-05 ENCOUNTER — Emergency Department (HOSPITAL_COMMUNITY): Payer: BC Managed Care – PPO

## 2020-10-05 ENCOUNTER — Telehealth: Payer: Self-pay | Admitting: Cardiology

## 2020-10-05 ENCOUNTER — Emergency Department (HOSPITAL_COMMUNITY)
Admission: EM | Admit: 2020-10-05 | Discharge: 2020-10-05 | Disposition: A | Discharge status: Home / Self Care | Payer: BC Managed Care – PPO | Attending: Emergency Medicine | Admitting: Emergency Medicine

## 2020-10-05 ENCOUNTER — Other Ambulatory Visit: Payer: Self-pay

## 2020-10-05 DIAGNOSIS — R0789 Other chest pain: Secondary | ICD-10-CM | POA: Insufficient documentation

## 2020-10-05 DIAGNOSIS — I2510 Atherosclerotic heart disease of native coronary artery without angina pectoris: Secondary | ICD-10-CM | POA: Insufficient documentation

## 2020-10-05 DIAGNOSIS — I10 Essential (primary) hypertension: Secondary | ICD-10-CM | POA: Diagnosis not present

## 2020-10-05 DIAGNOSIS — Z87891 Personal history of nicotine dependence: Secondary | ICD-10-CM | POA: Diagnosis not present

## 2020-10-05 DIAGNOSIS — Z951 Presence of aortocoronary bypass graft: Secondary | ICD-10-CM | POA: Diagnosis not present

## 2020-10-05 DIAGNOSIS — Z7901 Long term (current) use of anticoagulants: Secondary | ICD-10-CM | POA: Diagnosis not present

## 2020-10-05 DIAGNOSIS — R079 Chest pain, unspecified: Secondary | ICD-10-CM | POA: Diagnosis not present

## 2020-10-05 DIAGNOSIS — Z79899 Other long term (current) drug therapy: Secondary | ICD-10-CM | POA: Diagnosis not present

## 2020-10-05 LAB — TROPONIN I (HIGH SENSITIVITY)
Troponin I (High Sensitivity): 3 ng/L (ref <18)
Troponin I (High Sensitivity): <2 ng/L (ref <18)

## 2020-10-05 LAB — BASIC METABOLIC PANEL
Anion gap: 9 (ref 5–15)
BUN: 9 mg/dL (ref 6–20)
CO2: 28 mmol/L (ref 22–32)
Calcium: 9.6 mg/dL (ref 8.9–10.3)
Chloride: 99 mmol/L (ref 98–111)
Creatinine, Ser: 0.88 mg/dL (ref 0.61–1.24)
GFR, Estimated: >60 mL/min (ref >60)
Glucose, Bld: 98 mg/dL (ref 70–99)
Potassium: 4.4 mmol/L (ref 3.5–5.1)
Sodium: 136 mmol/L (ref 135–145)

## 2020-10-05 LAB — CBC
HCT: 43.1 % (ref 39.0–52.0)
Hemoglobin: 15.2 g/dL (ref 13.0–17.0)
MCH: 31.4 pg (ref 26.0–34.0)
MCHC: 35.3 g/dL (ref 30.0–36.0)
MCV: 89 fL (ref 80.0–100.0)
Platelets: 219 10*3/uL (ref 150–400)
RBC: 4.84 MIL/uL (ref 4.22–5.81)
RDW: 13.2 % (ref 11.5–15.5)
WBC: 10.2 10*3/uL (ref 4.0–10.5)
nRBC: 0 % (ref 0.0–0.2)

## 2020-10-05 MED ORDER — ALUM & MAG HYDROXIDE-SIMETH 200-200-20 MG/5ML PO SUSP
30.0000 mL | Freq: Once | ORAL | Status: AC
  Administered 2020-10-05: 30 mL via ORAL
  Filled 2020-10-05: qty 30

## 2020-10-05 MED ORDER — LIDOCAINE VISCOUS HCL 2 % MT SOLN
15.0000 mL | Freq: Once | OROMUCOSAL | Status: AC
  Administered 2020-10-05: 15 mL via ORAL
  Filled 2020-10-05: qty 15

## 2020-10-05 NOTE — Discharge Instructions (Signed)
Your work-up today was overall reassuring.  You may take Tylenol/ibuprofen, as well as possible Pepcid or Maalox which is the medicine you're given here in the ER.  Please follow-up with your cardiologist next week.  Return to the ER for any new or worsening symptoms.

## 2020-10-05 NOTE — ED Triage Notes (Signed)
Pt here with intermittent chest pressure X4 days. Pt with hx bypass surgery. Pt denies pain currently. Reports pain is better when he leans forward. VSS with EMS.

## 2020-10-05 NOTE — ED Provider Notes (Signed)
[facility] EMERGENCY DEPARTMENT Provider Note   CSN: [id number] Arrival date & time: 10/05/20  1239     History Chief Complaint  Patient presents with  . Chest Pain    Sean Flynn is a 55 y.o. male.  HPI 55 year old male with history anxiety, CAD, history of bypass, former smoker, hypertension, hyperlipidemia, A. fib on Xarelto and Plavix presents to the ER with 2 days of chest pressure which has been intermittent.  Patient states that he was in his normal state of health up until Tuesday when he received a shingles vaccine from his PCP.  He states since then, he has been experiencing intermittent chest pressure which he cannot quite differentiate if this is reflux or his typical chest pain.  He first noticed it on a walk with his wife several days ago, episode lasted about an hour and then subsided.  Since then he has also had episodes even at rest.  He does not have any nitro, but does state that he did take 4 pills of baby aspirin earlier today.  He has a history of a double bypass surgery back in April 2021.  He has been compliant with all of his medications.  He states that he recently had discontinued pantoprazole, but given the chest pain has been taking it over the last few days.  He denies any episodes of syncope, diaphoresis, nausea, vomiting, radiation, arm pain.  He is followed by Dr. Rosemary Holms with [facility] cardiology and states that he also has another cardiologist through [facility]: Lambert Rhonda W.  He endorses very mild chest pain currently here in the ED.    Past Medical History:  Diagnosis Date  . Anxiety   . Coronary artery disease   . Former smoker   . Hypertension   . Mixed hyperlipidemia   . TIA (transient ischemic attack)     Patient Active Problem List   Diagnosis Date Noted  . History of atrial fibrillation less than 8 weeks after coronary artery bypass graft 05/11/2020  . SVT (supraventricular tachycardia) (HCC) 03/15/2020  . Paroxysmal atrial  fibrillation (HCC) 03/15/2020  . PAD (peripheral artery disease) (HCC) 12/24/2019  . Gout 11/23/2019  . 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 Coronary artery disease of bypass graft of native heart with stable angina pectoris (HCC) 11/14/2019  . Family history of early CAD 11/14/2019  . Acute coronary syndrome (HCC) 11/03/2019  . Chronic bilateral low back pain with left-sided sciatica 10/14/2019  . Family history of colon cancer 10/14/2019  . Hyperglycemia 10/14/2019  . Numbness 10/14/2019  . Visual changes 10/14/2019  . Chronic gout of multiple sites 06/15/2019  . Chronic mid back pain 06/15/2019  . Elevated liver function tests 06/15/2019  . Laceration of extensor muscle, fascia and tendon of right thumb at wrist and hand level, initial encounter 04/05/2019  . Severe obesity (BMI 35.0-39.9) with comorbidity (HCC) 01/18/2019  . Atherosclerosis of aorta (HCC) 11/16/2017  . Alcohol use 06/11/2016  . Atypical chest pain 06/11/2016  . Benign essential HTN 06/11/2016  . Insomnia 06/11/2016    Past Surgical History:  Procedure Laterality Date  . CARDIAC CATHETERIZATION    . CORONARY ARTERY BYPASS GRAFT N/A 11/07/2019   Procedure: CORONARY ARTERY BYPASS GRAFTING (CABG) x 2 WITH BILATERAL INTERNAL MAMMARY ARTERIES. LIMA TO LAD, RIMA TO DISTAL RCA;  Surgeon: Loreli Slot, MD;  Location: [facility] OR;  Service: Open Heart Surgery;  Laterality: N/A;  . LEFT HEART CATH AND CORONARY ANGIOGRAPHY N/A 11/03/2019   Procedure: LEFT HEART CATH  AND CORONARY ANGIOGRAPHY;  Surgeon: Elder Negus, MD;  Location: [facility] INVASIVE CV LAB;  Service: Cardiovascular;  Laterality: N/A;  . REPAIR EXTENSOR TENDON Right 04/07/2019   Procedure: REPAIR RIGHT THUMB EXTENSOR TENDON;  Surgeon: Betha Loa, MD;  Location: [facility];  Service: Orthopedics;  Laterality: Right;  . TEE WITHOUT CARDIOVERSION N/A 11/07/2019   Procedure: Transesophageal Echocardiogram (Tee);  Surgeon: Loreli Slot, MD;   Location: [facility] OR;  Service: Open Heart Surgery;  Laterality: N/A;       Family History  Problem Relation Age of Onset  . CAD Father        MI in early 62s  . Cancer Father   . Heart failure Father   . Dementia Mother   . Healthy Brother     Social History   Tobacco Use  . Smoking status: Former Smoker    Packs/day: 1.00    Years: 25.00    Pack years: 25.00    Types: Cigarettes    Quit date: 2010    Years since quitting: 12.1  . Smokeless tobacco: Never Used  Vaping Use  . Vaping Use: Never used  Substance Use Topics  . Alcohol use: Not Currently    Alcohol/week: 6.0 - 10.0 standard drinks    Types: 6 - 10 Cans of beer per week    Comment: Daily, 01/03/20 none  . Drug use: Never    Home Medications Prior to Admission medications   Medication Sig Start Date End Date Taking? Authorizing Provider  allopurinol (ZYLOPRIM) 100 MG tablet Take 200 mg by mouth daily.  12/21/19  Yes [provider]  atorvastatin (LIPITOR) 40 MG tablet TAKE 1 TABLET BY MOUTH EVERY DAY AT 6PM 06/25/20  Yes [name], Manish J, MD  clopidogrel (PLAVIX) 75 MG tablet TAKE 1 TABLET BY MOUTH EVERY DAY 04/11/20  Yes [name], Manish J, MD  colchicine 0.6 MG tablet Take 0.6 mg by mouth 2 (two) times daily.   Yes Alver Fisher, RN  lisinopril (ZESTRIL) 5 MG tablet TAKE 1 TABLET BY MOUTH EVERY DAY 08/27/20  Yes [name], Manish J, MD  Multiple Vitamin (MULTIVITAMIN) tablet Take 1 tablet by mouth daily.    Yes Alver Fisher, RN  Omega-3 Fatty Acids (OMEGA-3 FISH OIL) 1200 MG CAPS Take 2,400 mg by mouth at bedtime.    Yes Alver Fisher, RN  pantoprazole (PROTONIX) 40 MG tablet Take 1 tablet (40 mg total) by mouth daily as needed (acid reflux). Patient taking differently: Take 40 mg by mouth daily. 07/10/20  Yes [name], Manish J, MD  psyllium (METAMUCIL) 58.6 % packet Take 1 packet by mouth daily.   Yes [provider]  XARELTO 20 MG TABS tablet TAKE 1 TABLET (20 MG TOTAL) BY MOUTH  DAILY WITH SUPPER. 03/29/20  Yes [name], Manish J, MD  diltiazem (CARDIZEM) 30 MG tablet Take 1 tablet (30 mg total) by mouth every 6 (six) hours as needed. Patient taking differently: Take 30 mg by mouth every 6 (six) hours as needed (afib). 02/10/20   [name], Anabel Bene, MD  nitroGLYCERIN (NITROSTAT) 0.3 MG SL tablet Place 0.3 mg under the tongue every 5 (five) minutes as needed for chest pain. 09/18/20   [provider]  zolpidem (AMBIEN) 5 MG tablet Take 2.5-5 mg by mouth at bedtime as needed for sleep.     Alver Fisher, RN    Allergies    Metoprolol tartrate and Oxycodone-acetaminophen  Review of Systems   Review of  Systems  Constitutional: Negative for chills and fever.  HENT: Negative for ear pain and sore throat.   Eyes: Negative for pain and visual disturbance.  Respiratory: Negative for cough and shortness of breath.   Cardiovascular: Positive for chest pain. Negative for palpitations.  Gastrointestinal: Negative for abdominal pain and vomiting.  Genitourinary: Negative for dysuria and hematuria.  Musculoskeletal: Negative for arthralgias and back pain.  Skin: Negative for color change and rash.  Neurological: Negative for seizures and syncope.  All other systems reviewed and are negative.   Physical Exam Updated Vital Signs BP 132/80   Pulse 78   Temp 98.2 F (36.8 C) (Oral)   Resp 17   SpO2 97%   Physical Exam Vitals reviewed.  Constitutional:      General: He is not in acute distress.    Appearance: Normal appearance. He is not ill-appearing, toxic-appearing or diaphoretic.  HENT:     Head: Normocephalic.  Cardiovascular:     Rate and Rhythm: Normal rate and regular rhythm.     Pulses: Normal pulses.     Heart sounds: Normal heart sounds, S1 normal and S2 normal. No murmur heard. No friction rub.  Pulmonary:     Effort: Pulmonary effort is normal.     Breath sounds: Normal breath sounds. No decreased breath sounds.  Abdominal:     General:  Abdomen is flat.     Palpations: Abdomen is soft.  Musculoskeletal:     Right lower leg: No edema.     Left lower leg: No edema.  Neurological:     Mental Status: He is alert.     ED Results / Procedures / Treatments   Labs (all labs ordered are listed, but only abnormal results are displayed) Labs Reviewed  BASIC METABOLIC PANEL  CBC  TROPONIN I (HIGH SENSITIVITY)  TROPONIN I (HIGH SENSITIVITY)    EKG None  Radiology DG Chest 2 View  Result Date: 10/05/2020 CLINICAL DATA:  Chest pain. EXAM: CHEST - 2 VIEW COMPARISON:  02/02/2020 FINDINGS: The heart size and mediastinal contours are normal status post prior CABG. There is no evidence of pulmonary edema, consolidation, pneumothorax, nodule or pleural fluid. Visualized bony structures are unremarkable. IMPRESSION: No active cardiopulmonary disease. Electronically Signed   By: Irish LackGlenn  Yamagata M.D.   On: 10/05/2020 13:21    Procedures Procedures   Medications Ordered in ED Medications  alum & mag hydroxide-simeth (MAALOX/MYLANTA) 200-200-20 MG/5ML suspension 30 mL (30 mLs Oral Given 10/05/20 1748)    And  lidocaine (XYLOCAINE) 2 % viscous mouth solution 15 mL (15 mLs Oral Given 10/05/20 1749)    ED Course  I have reviewed the triage vital signs and the nursing notes.  Pertinent labs & imaging results that were available during my care of the patient were reviewed by me and considered in my medical decision making (see chart for details).  Clinical Course as of 10/05/20 1901  Caleen EssexFri Oct 05, 2020  [id number] 55 year old male with 3 days of intermittent chest burning/pressure.  On arrival, he is very well-appearing, nondiaphoretic, no acute distress, resting comfortably in the ER bed.  Physical exam is unremarkable.  Labs ordered in triage, reviewed by myself.  CBC, BMP, initial troponin unremarkable.  Chest x-ray also unremarkable.  EKG normal sinus rhythm, with no evidence of acute ischemic changes.  Discussed the plan of care with the  patient, plan for repeat troponin, GI cocktail, will touch base with cardiology as [facility] [[facility]]  [phone]  Second troponin negative [MB]  1853 Spoke with Dr. Jacinto Halim with [facility] cardiology, discussed the case with him.  He reviewed his EKG.  Patient is felt to be stable for discharge, suspect potential GERD versus myalgias secondary to the zoster vaccine.  Encouraged to continue to take pantoprazole/Tylenol or Aleve.  Pt notes some improvement in symptoms here with a GI cocktail, so suspect some element of GERD here.  Encouraged adding on possible Pepcid/Maalox.  Dr. Jacinto Halim will coordinate follow-up in the outpatient setting.  Patient was informed of the plan, and is agreeable.  Turn precautions discussed.  He is stable for discharge at this time.  This was a shared visit with my supervising physician Dr. Stevie Kern who independently saw and evaluated the patient & provided guidance in evaluation/management/disposition ,in agreement with care  [MB]    Clinical Course User Index [MB] Leone Brand   MDM Rules/Calculators/A&P                            Final Clinical Impression(s) / ED Diagnoses Final diagnoses:  Chest pain, unspecified type    Rx / DC Orders ED Discharge Orders    None       Leone Brand 10/05/20 1901    Milagros Loll, MD 10/08/20 1434

## 2020-10-05 NOTE — ED Notes (Signed)
Cardiology at bedside.

## 2020-10-08 NOTE — Telephone Encounter (Signed)
I sent a message re: the same yesterday. I agree. Please schedule f/u  Thanks MJP

## 2020-10-09 DIAGNOSIS — I25708 Atherosclerosis of coronary artery bypass graft(s), unspecified, with other forms of angina pectoris: Secondary | ICD-10-CM | POA: Diagnosis not present

## 2020-10-09 DIAGNOSIS — R0789 Other chest pain: Secondary | ICD-10-CM | POA: Diagnosis not present

## 2020-10-09 DIAGNOSIS — E785 Hyperlipidemia, unspecified: Secondary | ICD-10-CM | POA: Diagnosis not present

## 2020-10-09 DIAGNOSIS — R002 Palpitations: Secondary | ICD-10-CM | POA: Insufficient documentation

## 2020-10-09 DIAGNOSIS — I10 Essential (primary) hypertension: Secondary | ICD-10-CM | POA: Diagnosis not present

## 2020-10-13 NOTE — Telephone Encounter (Signed)
Patient states he is doing fine. 

## 2020-10-14 ENCOUNTER — Other Ambulatory Visit: Payer: Self-pay | Admitting: Cardiology

## 2020-10-14 DIAGNOSIS — I739 Peripheral vascular disease, unspecified: Secondary | ICD-10-CM

## 2020-10-24 DIAGNOSIS — M5136 Other intervertebral disc degeneration, lumbar region: Secondary | ICD-10-CM | POA: Diagnosis not present

## 2020-10-24 DIAGNOSIS — M9903 Segmental and somatic dysfunction of lumbar region: Secondary | ICD-10-CM | POA: Diagnosis not present

## 2020-10-29 ENCOUNTER — Other Ambulatory Visit: Payer: Self-pay | Admitting: Cardiology

## 2020-10-31 DIAGNOSIS — R202 Paresthesia of skin: Secondary | ICD-10-CM | POA: Diagnosis not present

## 2020-11-13 DIAGNOSIS — R0789 Other chest pain: Secondary | ICD-10-CM | POA: Diagnosis not present

## 2020-11-13 DIAGNOSIS — I25708 Atherosclerosis of coronary artery bypass graft(s), unspecified, with other forms of angina pectoris: Secondary | ICD-10-CM | POA: Diagnosis not present

## 2020-11-13 DIAGNOSIS — I10 Essential (primary) hypertension: Secondary | ICD-10-CM | POA: Diagnosis not present

## 2020-11-13 DIAGNOSIS — E785 Hyperlipidemia, unspecified: Secondary | ICD-10-CM | POA: Diagnosis not present

## 2020-12-28 ENCOUNTER — Other Ambulatory Visit: Payer: Self-pay | Admitting: Cardiology

## 2021-01-11 ENCOUNTER — Emergency Department (HOSPITAL_BASED_OUTPATIENT_CLINIC_OR_DEPARTMENT_OTHER): Payer: BC Managed Care – PPO

## 2021-01-11 ENCOUNTER — Other Ambulatory Visit: Payer: Self-pay

## 2021-01-11 ENCOUNTER — Encounter (HOSPITAL_BASED_OUTPATIENT_CLINIC_OR_DEPARTMENT_OTHER): Payer: Self-pay | Admitting: *Deleted

## 2021-01-11 ENCOUNTER — Emergency Department (HOSPITAL_BASED_OUTPATIENT_CLINIC_OR_DEPARTMENT_OTHER)
Admission: EM | Admit: 2021-01-11 | Discharge: 2021-01-11 | Disposition: A | Discharge status: Home / Self Care | Payer: BC Managed Care – PPO | Attending: Emergency Medicine | Admitting: Emergency Medicine

## 2021-01-11 DIAGNOSIS — Z951 Presence of aortocoronary bypass graft: Secondary | ICD-10-CM | POA: Insufficient documentation

## 2021-01-11 DIAGNOSIS — Z7901 Long term (current) use of anticoagulants: Secondary | ICD-10-CM | POA: Diagnosis not present

## 2021-01-11 DIAGNOSIS — Z87891 Personal history of nicotine dependence: Secondary | ICD-10-CM | POA: Diagnosis not present

## 2021-01-11 DIAGNOSIS — I10 Essential (primary) hypertension: Secondary | ICD-10-CM | POA: Diagnosis not present

## 2021-01-11 DIAGNOSIS — H539 Unspecified visual disturbance: Secondary | ICD-10-CM

## 2021-01-11 DIAGNOSIS — Z7902 Long term (current) use of antithrombotics/antiplatelets: Secondary | ICD-10-CM | POA: Diagnosis not present

## 2021-01-11 DIAGNOSIS — I25118 Atherosclerotic heart disease of native coronary artery with other forms of angina pectoris: Secondary | ICD-10-CM | POA: Diagnosis not present

## 2021-01-11 DIAGNOSIS — I480 Paroxysmal atrial fibrillation: Secondary | ICD-10-CM | POA: Insufficient documentation

## 2021-01-11 DIAGNOSIS — I679 Cerebrovascular disease, unspecified: Secondary | ICD-10-CM | POA: Diagnosis not present

## 2021-01-11 DIAGNOSIS — H538 Other visual disturbances: Secondary | ICD-10-CM | POA: Diagnosis present

## 2021-01-11 LAB — BASIC METABOLIC PANEL
Anion gap: 7 (ref 5–15)
BUN: 14 mg/dL (ref 6–20)
CO2: 30 mmol/L (ref 22–32)
Calcium: 9.1 mg/dL (ref 8.9–10.3)
Chloride: 99 mmol/L (ref 98–111)
Creatinine, Ser: 0.99 mg/dL (ref 0.61–1.24)
GFR, Estimated: >60 mL/min (ref >60)
Glucose, Bld: 104 mg/dL — ABNORMAL HIGH (ref 70–99)
Potassium: 3.8 mmol/L (ref 3.5–5.1)
Sodium: 136 mmol/L (ref 135–145)

## 2021-01-11 LAB — CBC WITH DIFFERENTIAL/PLATELET
Abs Immature Granulocytes: 0.02 10*3/uL (ref 0.00–0.07)
Basophils Absolute: 0.1 10*3/uL (ref 0.0–0.1)
Basophils Relative: 1 %
Eosinophils Absolute: 0.1 10*3/uL (ref 0.0–0.5)
Eosinophils Relative: 1 %
HCT: 45.1 % (ref 39.0–52.0)
Hemoglobin: 15.6 g/dL (ref 13.0–17.0)
Immature Granulocytes: 0 %
Lymphocytes Relative: 26 %
Lymphs Abs: 2.3 10*3/uL (ref 0.7–4.0)
MCH: 30.8 pg (ref 26.0–34.0)
MCHC: 34.6 g/dL (ref 30.0–36.0)
MCV: 89.1 fL (ref 80.0–100.0)
Monocytes Absolute: 0.6 10*3/uL (ref 0.1–1.0)
Monocytes Relative: 7 %
Neutro Abs: 5.7 10*3/uL (ref 1.7–7.7)
Neutrophils Relative %: 65 %
Platelets: 196 10*3/uL (ref 150–400)
RBC: 5.06 MIL/uL (ref 4.22–5.81)
RDW: 12.9 % (ref 11.5–15.5)
WBC: 8.8 10*3/uL (ref 4.0–10.5)
nRBC: 0 % (ref 0.0–0.2)

## 2021-01-11 NOTE — ED Provider Notes (Signed)
[facility] EMERGENCY DEPT Provider Note   CSN: [id number] Arrival date & time: 01/11/21  1625     History Chief Complaint  Patient presents with   Blurred Vision    Sean Flynn is a 55 y.o. male.  Sean Flynn has a history of coronary artery disease and has had a TIA in the past.  He takes Plavix.  Today, while doing yard work in the heat, he developed some spots in his vision.  He went inside, took a nap, and awoke a little bit disoriented.  He currently has a headache that is very mild.  Otherwise, all symptoms have resolved.  The history is provided by the patient.  Eye Problem Location:  Both eyes Quality: spots in visual field diffusely; no loss of peripheral vision. Onset quality:  Sudden (He was out working in the yard when it happened.) Duration:  30 minutes Timing:  Constant Progression:  Resolved Chronicity:  Recurrent (Has had it happen in the past when he looks at his phone or computer screen. He looks away, and symptoms resolve) Relieved by:  Sleep (took a nap and awoke a little disoriented) Worsened by:  Nothing Ineffective treatments:  None tried Associated symptoms: headaches (mild frontal HA now)   Associated symptoms: no blurred vision, no decreased vision, no discharge, no double vision, no numbness, no photophobia, no tingling, no vomiting and no weakness       Past Medical History:  Diagnosis Date   Anxiety    Coronary artery disease    Former smoker    Hypertension    Mixed hyperlipidemia    TIA (transient ischemic attack)     Patient Active Problem List   Diagnosis Date Noted   History of atrial fibrillation less than 8 weeks after coronary artery bypass graft 05/11/2020   SVT (supraventricular tachycardia) (HCC) 03/15/2020   Paroxysmal atrial fibrillation (HCC) 03/15/2020   PAD (peripheral artery disease) (HCC) 12/24/2019   Gout 11/23/2019   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   Coronary artery disease of bypass graft  of native heart with stable angina pectoris (HCC) 11/14/2019   Family history of early CAD 11/14/2019   Acute coronary syndrome (HCC) 11/03/2019   Chronic bilateral low back pain with left-sided sciatica 10/14/2019   Family history of colon cancer 10/14/2019   Hyperglycemia 10/14/2019   Numbness 10/14/2019   Visual changes 10/14/2019   Chronic gout of multiple sites 06/15/2019   Chronic mid back pain 06/15/2019   Elevated liver function tests 06/15/2019   Laceration of extensor muscle, fascia and tendon of right thumb at wrist and hand level, initial encounter 04/05/2019   Severe obesity (BMI 35.0-39.9) with comorbidity (HCC) 01/18/2019   Atherosclerosis of aorta (HCC) 11/16/2017   Alcohol use 06/11/2016   Atypical chest pain 06/11/2016   Benign essential HTN 06/11/2016   Insomnia 06/11/2016    Past Surgical History:  Procedure Laterality Date   CARDIAC CATHETERIZATION     CORONARY ARTERY BYPASS GRAFT N/A 11/07/2019   Procedure: CORONARY ARTERY BYPASS GRAFTING (CABG) x 2 WITH BILATERAL INTERNAL MAMMARY ARTERIES. LIMA TO LAD, RIMA TO DISTAL RCA;  Surgeon: Loreli Slot, MD;  Location: [facility] OR;  Service: Open Heart Surgery;  Laterality: N/A;   LEFT HEART CATH AND CORONARY ANGIOGRAPHY N/A 11/03/2019   Procedure: LEFT HEART CATH AND CORONARY ANGIOGRAPHY;  Surgeon: Elder Negus, MD;  Location: [facility] INVASIVE CV LAB;  Service: Cardiovascular;  Laterality: N/A;   REPAIR EXTENSOR TENDON Right 04/07/2019   Procedure: REPAIR RIGHT THUMB EXTENSOR  TENDON;  Surgeon: Betha Loa, MD;  Location: [facility];  Service: Orthopedics;  Laterality: Right;   TEE WITHOUT CARDIOVERSION N/A 11/07/2019   Procedure: Transesophageal Echocardiogram (Tee);  Surgeon: Loreli Slot, MD;  Location: [facility] OR;  Service: Open Heart Surgery;  Laterality: N/A;       Family History  Problem Relation Age of Onset   Dementia Mother    CAD Father        MI in early 65s   Cancer Father    Heart  failure Father    Healthy Brother     Social History   Tobacco Use   Smoking status: Former    Packs/day: 1.00    Years: 25.00    Pack years: 25.00    Types: Cigarettes    Quit date: 2010    Years since quitting: 12.4   Smokeless tobacco: Never  Vaping Use   Vaping Use: Never used  Substance Use Topics   Alcohol use: Yes    Alcohol/week: 6.0 - 10.0 standard drinks    Types: 6 - 10 Cans of beer per week    Comment: Daily, 01/03/20 none   Drug use: Never    Home Medications Prior to Admission medications   Medication Sig Start Date End Date Taking? Authorizing Provider  allopurinol (ZYLOPRIM) 100 MG tablet Take 200 mg by mouth daily.  12/21/19  Yes [provider]  atorvastatin (LIPITOR) 40 MG tablet TAKE 1 TABLET BY MOUTH EVERY DAY AT 6PM 12/28/20  Yes [name], Manish J, MD  clopidogrel (PLAVIX) 75 MG tablet TAKE 1 TABLET BY MOUTH EVERY DAY 10/15/20  Yes [name], Manish J, MD  colchicine 0.6 MG tablet Take 0.6 mg by mouth 2 (two) times daily.   Yes Alver Fisher, RN  lisinopril (ZESTRIL) 5 MG tablet TAKE 1 TABLET BY MOUTH EVERY DAY 08/27/20  Yes [name], Manish J, MD  Multiple Vitamin (MULTIVITAMIN) tablet Take 1 tablet by mouth daily.    Yes Alver Fisher, RN  Omega-3 Fatty Acids (OMEGA-3 FISH OIL) 1200 MG CAPS Take 2,400 mg by mouth at bedtime.    Yes Alver Fisher, RN  pantoprazole (PROTONIX) 40 MG tablet Take 1 tablet (40 mg total) by mouth daily as needed (acid reflux). Patient taking differently: Take 40 mg by mouth daily. 07/10/20  Yes [name], Manish J, MD  psyllium (METAMUCIL) 58.6 % packet Take 1 packet by mouth daily.   Yes [provider]  XARELTO 20 MG TABS tablet TAKE 1 TABLET (20 MG TOTAL) BY MOUTH DAILY WITH SUPPER. 10/29/20  Yes [name], Manish J, MD  diltiazem (CARDIZEM) 30 MG tablet Take 1 tablet (30 mg total) by mouth every 6 (six) hours as needed. Patient taking differently: Take 30 mg by mouth every 6 (six) hours as needed  (afib). 02/10/20   [name], Anabel Bene, MD  nitroGLYCERIN (NITROSTAT) 0.3 MG SL tablet Place 0.3 mg under the tongue every 5 (five) minutes as needed for chest pain. 09/18/20   [provider]  zolpidem (AMBIEN) 5 MG tablet Take 2.5-5 mg by mouth at bedtime as needed for sleep.     Alver Fisher, RN    Allergies    Metoprolol tartrate and Oxycodone-acetaminophen  Review of Systems   Review of Systems  Constitutional:  Negative for chills and fever.  HENT:  Negative for ear pain and sore throat.   Eyes:  Negative for blurred vision, double vision, photophobia, pain, discharge and visual disturbance.  Respiratory:  Negative for cough and shortness of breath.   Cardiovascular:  Negative for chest pain and palpitations.  Gastrointestinal:  Negative for abdominal pain and vomiting.  Genitourinary:  Negative for dysuria and hematuria.  Musculoskeletal:  Negative for arthralgias and back pain.  Skin:  Negative for color change and rash.  Neurological:  Positive for headaches (mild frontal HA now). Negative for tingling, seizures, syncope, weakness and numbness.  All other systems reviewed and are negative.  Physical Exam Updated Vital Signs BP 134/75 (BP Location: Left Arm)   Pulse 62   Temp 98.4 F (36.9 C) (Oral)   Resp 14   Ht 5\' 9"  (1.753 m)   Wt 99.8 kg   SpO2 99%   BMI 32.49 kg/m   Physical Exam Vitals and nursing note reviewed.  Constitutional:      Appearance: He is well-developed.  HENT:     Head: Normocephalic and atraumatic.  Eyes:     Extraocular Movements: Extraocular movements intact.     Conjunctiva/sclera: Conjunctivae normal.     Pupils: Pupils are equal, round, and reactive to light.     Comments: Visual fields normal  Cardiovascular:     Rate and Rhythm: Normal rate and regular rhythm.     Heart sounds: No murmur heard. Pulmonary:     Effort: Pulmonary effort is normal. No respiratory distress.     Breath sounds: Normal breath sounds.   Musculoskeletal:     Cervical back: Neck supple.  Skin:    General: Skin is warm and dry.  Neurological:     General: No focal deficit present.     Mental Status: He is alert and oriented to person, place, and time.     Cranial Nerves: No cranial nerve deficit.     Sensory: No sensory deficit.     Motor: No weakness.     Coordination: Coordination normal.    ED Results / Procedures / Treatments   Labs (all labs ordered are listed, but only abnormal results are displayed) Labs Reviewed  BASIC METABOLIC PANEL - Abnormal; Notable for the following components:      Result Value   Glucose, Bld 104 (*)    All other components within normal limits  CBC WITH DIFFERENTIAL/PLATELET    EKG EKG Interpretation  Date/Time:  Friday January 11 2021 17:35:50 EDT Ventricular Rate:  63 PR Interval:  168 QRS Duration: 90 QT Interval:  396 QTC Calculation: 405 R Axis:   70 Text Interpretation: Normal sinus rhythm Normal ECG normal axis no acute ischemia Confirmed by Pieter PartridgeWright, Giovanna Kemmerer ([id number]) on 01/11/2021 7:28:13 PM  Radiology CT Head Wo Contrast  Result Date: 01/11/2021 CLINICAL DATA:  Blurred vision.  Sinus headache. EXAM: CT HEAD WITHOUT CONTRAST TECHNIQUE: Contiguous axial images were obtained from the base of the skull through the vertex without intravenous contrast. COMPARISON:  12/16/2019 brain MR. Head CT 12/15/2019. FINDINGS: Brain: Mild, age advanced low density in the periventricular white matter likely related to small vessel disease. No mass lesion, hemorrhage, hydrocephalus, acute infarct, intra-axial, or extra-axial fluid collection. Vascular: Intracranial atherosclerosis. Skull: Normal Sinuses/Orbits: Normal imaged portions of the orbits and globes. Clear paranasal sinuses and mastoid air cells. Other: None. IMPRESSION: 1. No acute intracranial abnormality. 2. Age advanced small vessel ischemic change. Electronically Signed   By: Jeronimo GreavesKyle  Talbot M.D.   On: 01/11/2021 18:30     Procedures Procedures   Medications Ordered in ED Medications - No data to display  ED Course  I have reviewed the triage vital  signs and the nursing notes.  Pertinent labs & imaging results that were available during my care of the patient were reviewed by me and considered in my medical decision making (see chart for details).    MDM Rules/Calculators/A&P                          Sean Flynn presented with 30 minutes of spots in front of his vision and a very mild headache.  He does have extensive vascular risk factors and has had a TIA in the past as well as coronary artery disease. We talked through possible etiologies of symptoms including TIA, migraine aura, ocular pathology such as vitreous or retinal problems. He is anticoagulated on both Xarelto and Plavix.  I offered him transfer to an MRI capable site, and we also spoke about CT angiogram of his head and neck.  He understands that the diagnosis of TIA or stroke here is somewhat limited secondary to the availability of MRI.  Nonetheless, symptoms do seem to speak to more of an aura or a primary ophthalmologic issue.  He is most comfortable being discharged home and following with both ophthalmology and neurology.  Careful return precautions were discussed. Final Clinical Impression(s) / ED Diagnoses Final diagnoses:  Visual aura  Small vessel disease, cerebrovascular    Rx / DC Orders ED Discharge Orders     None        Koleen Distance, MD 01/11/21 [phone]

## 2021-01-11 NOTE — Progress Notes (Signed)
Attempted to get pt for CT, Respiratory drawing labs, said pt need EKG, and to go to restroom first. Will try back.

## 2021-01-11 NOTE — ED Triage Notes (Addendum)
Blurred vision and loss of peripheral vision today while he was doing yard work. Upon arrival, blurred vision and loss of  peripheral vision is resolved.  He also c/o if sinus headache.

## 2021-01-16 ENCOUNTER — Encounter: Payer: Self-pay | Admitting: Neurology

## 2021-01-16 ENCOUNTER — Ambulatory Visit: Payer: BC Managed Care – PPO | Admitting: Cardiology

## 2021-01-16 MED ORDER — VERAPAMIL HCL ER 120 MG PO TBCR: EXTENDED_RELEASE_TABLET | ORAL | 2 refills | Status: DC

## 2021-02-18 IMAGING — DX DG CHEST 1V PORT
1 series · 1 of 1 positions shown · non-contrast
Comparison: November 07, 2019

CLINICAL DATA: Status post coronary artery bypass grafting

EXAM:
PORTABLE CHEST 1 VIEW

[chest ap]
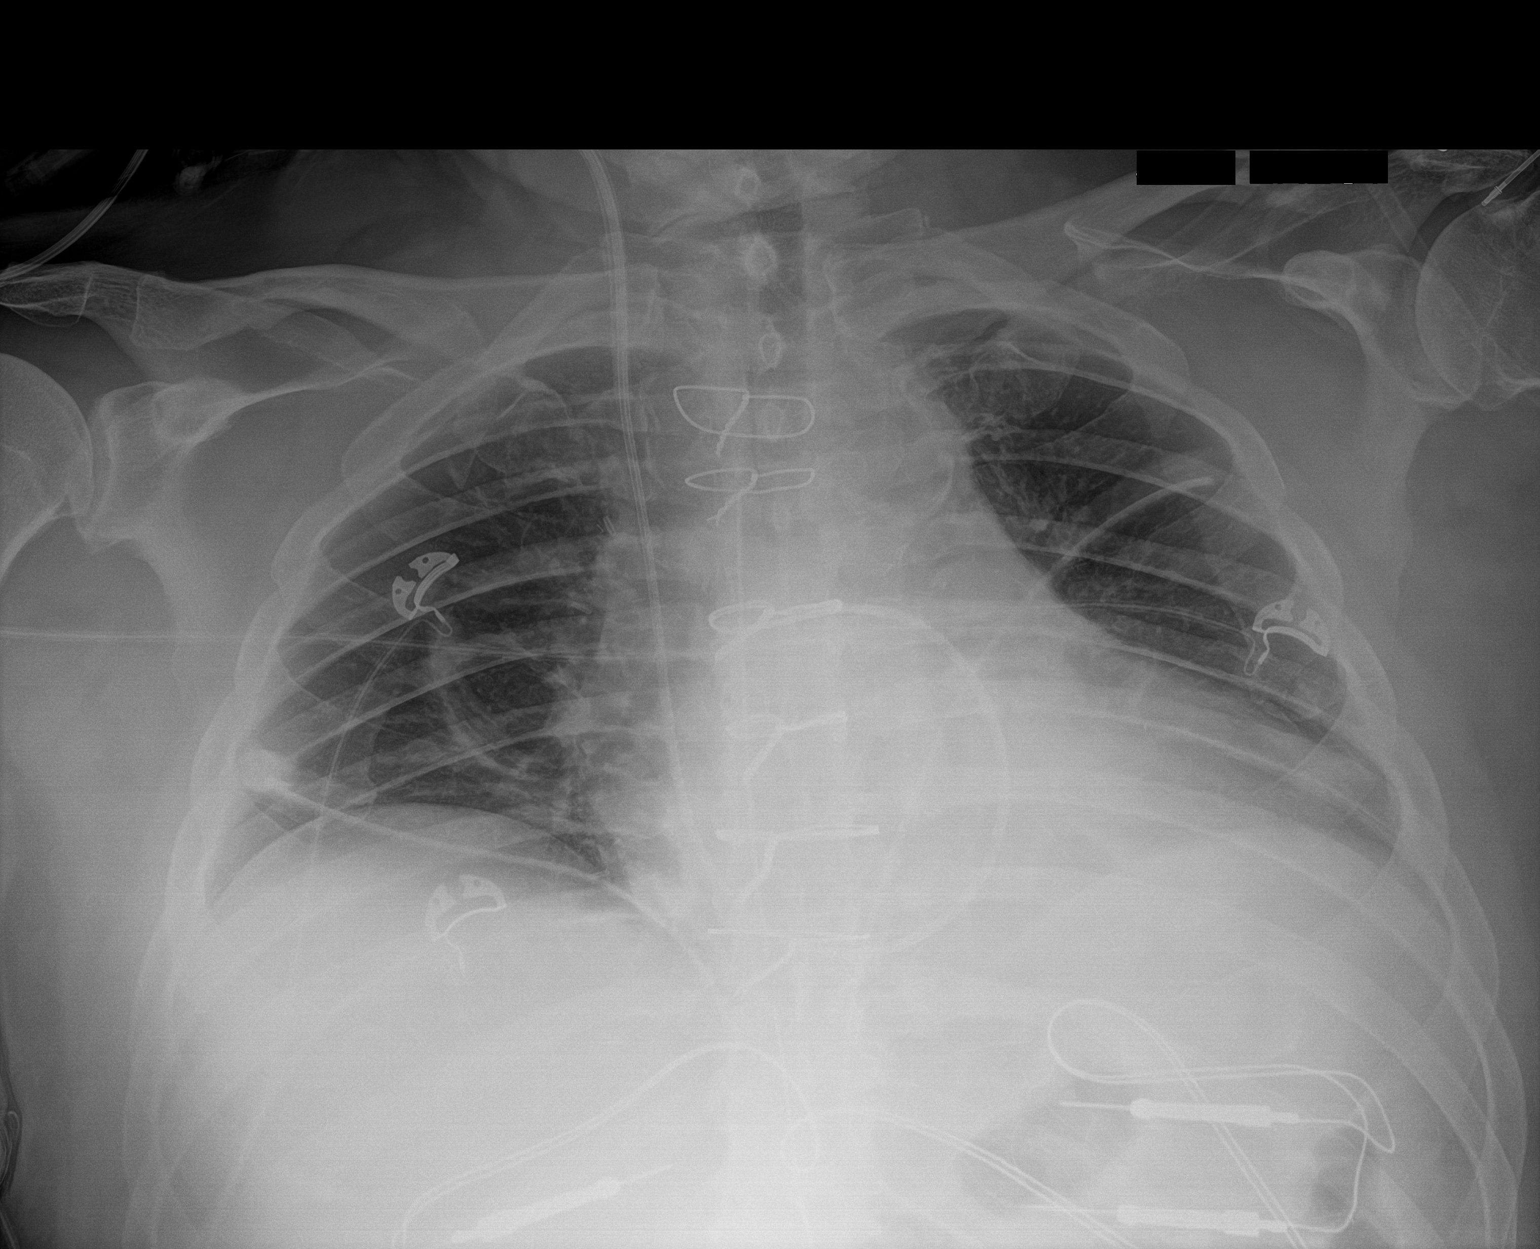

[1 of 1 positions shown; findings below may reference images not displayed]

FINDINGS: Endotracheal tube has been removed. Swan-Ganz catheter tip is in the
right main pulmonary artery. There is a left chest tube and
mediastinal drain. Temporary pacemaker wires are attached to the
right heart. No pneumothorax. There is atelectatic change in the
lower lung regions. No edema or airspace consolidation. Heart is
upper normal in size, stable. Pulmonary vascular is normal. There is
aortic atherosclerosis. No bone lesions.
IMPRESSION: Tube and catheter positions as described without evident
pneumothorax. Lower lung region atelectasis. Lungs elsewhere clear.
Stable cardiac silhouette.

Aortic Atherosclerosis ([id number]-XPP.P).

## 2021-02-19 IMAGING — DX DG CHEST 1V PORT
1 series · 1 of 1 positions shown · non-contrast
Comparison: 11/08/2019

CLINICAL DATA: Chest pain following coronary bypass grafting

EXAM:
PORTABLE CHEST 1 VIEW

[chest]
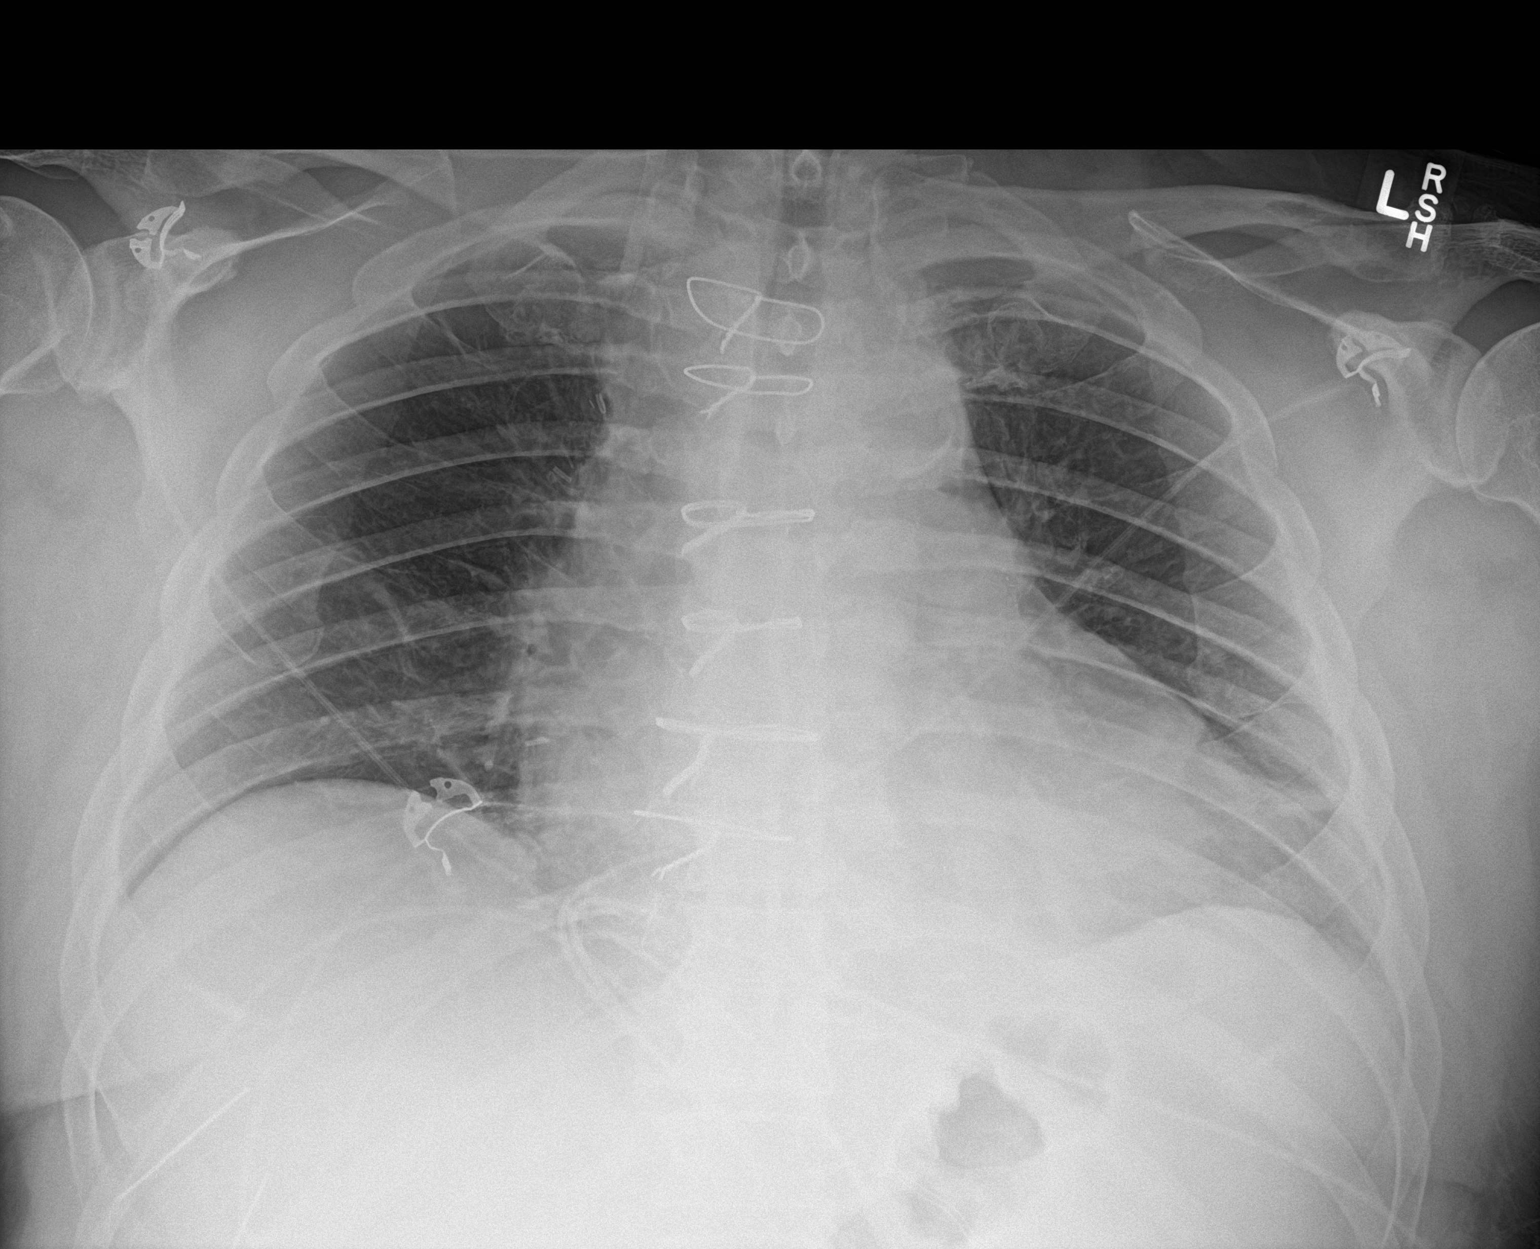

[1 of 1 positions shown; findings below may reference images not displayed]

FINDINGS: Swan-Ganz catheter has been removed in the interval. Right jugular
catheter remains. These dental drain and bilateral thoracostomy
catheters have been removed in the interval. No pneumothorax is
noted. Minimal left basilar atelectasis is again seen. No bony
abnormality is noted.
IMPRESSION: Interval removal of tubes and lines as described. No pneumothorax is
seen.

Mild left basilar atelectasis.

## 2021-02-24 ENCOUNTER — Other Ambulatory Visit: Payer: Self-pay | Admitting: Cardiology

## 2021-03-27 ENCOUNTER — Other Ambulatory Visit: Payer: Self-pay | Admitting: Cardiology

## 2021-03-27 DIAGNOSIS — I739 Peripheral vascular disease, unspecified: Secondary | ICD-10-CM

## 2021-03-28 IMAGING — MR MR HEAD W/O CM
10 of 11 series · 43 of 48 positions shown · non-contrast
Comparison: CTA head neck 12/15/2019

CLINICAL DATA: Headache and expressive aphasia

EXAM:
MRI HEAD WITHOUT CONTRAST
TECHNIQUE: Multiplanar, multiecho pulse sequences of the brain and surrounding
structures were obtained without intravenous contrast.

[Series 5: DWI · axial · 3.0mm · 0.88mm/px · z∈[-64,+86]mm · 10 of 102 slices shown (1 of 4)]
[im 1/102]
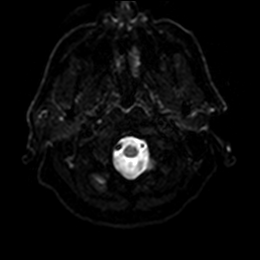
[im 12/102]
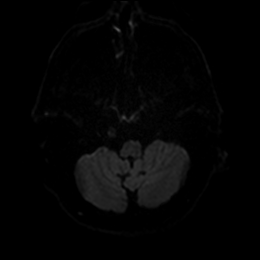
[im 23/102]
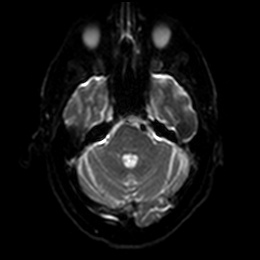
[im 34/102]
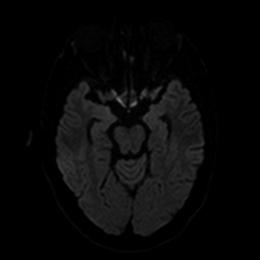
[im 45/102]
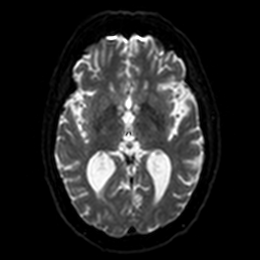
[im 57/102]
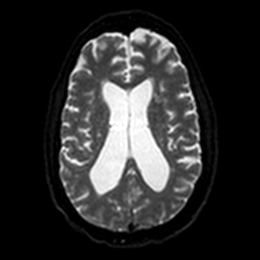
[im 68/102]
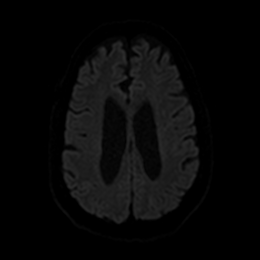
[im 79/102]
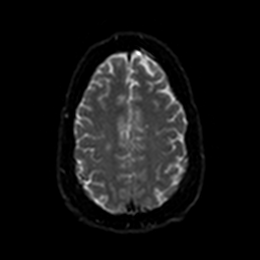
[im 90/102]
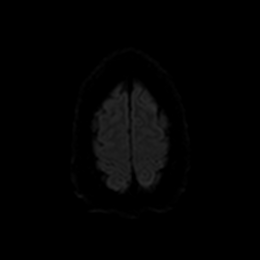
[im 102/102]
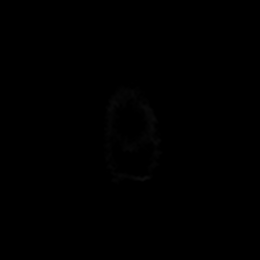

[Series 6: DWI · axial · 3.0mm · 0.88mm/px · z∈[-64,+86]mm · 5 of 51 slices shown (2 of 4)]
[im 1/51]
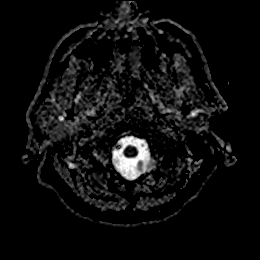
[im 13/51]
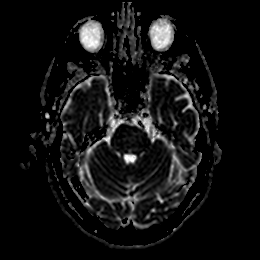
[im 26/51]
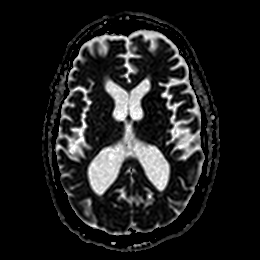
[im 38/51]
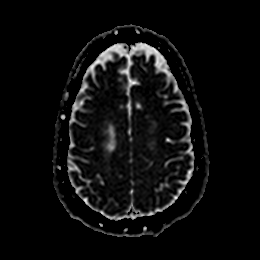
[im 51/51]
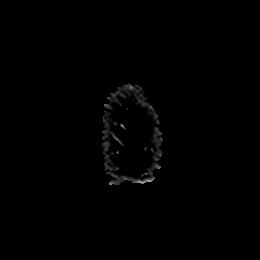

[Series 7: DWI · coronal · 4.0mm · 0.88mm/px · 6 of 70 slices shown (3 of 4)]
[im 1/70]
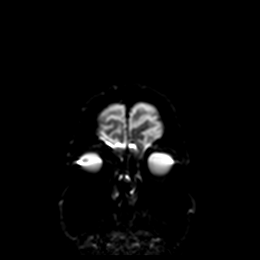
[im 14/70]
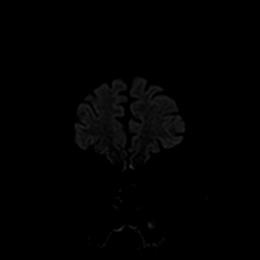
[im 28/70]
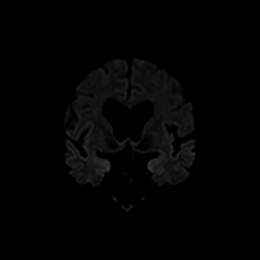
[im 42/70]
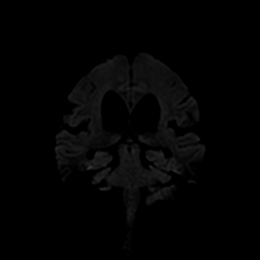
[im 56/70]
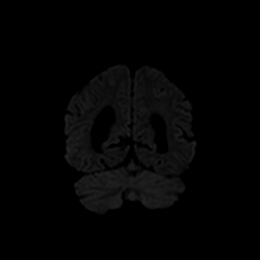
[im 70/70]
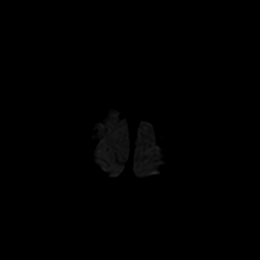

[Series 8: DWI · coronal · 4.0mm · 0.88mm/px · 3 of 35 slices shown (4 of 4)]
[im 1/35]
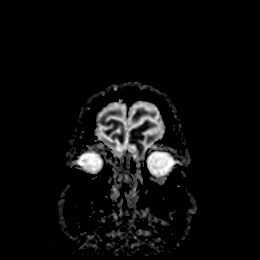
[im 18/35]
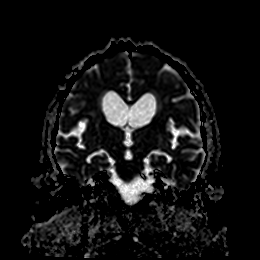
[im 35/35]
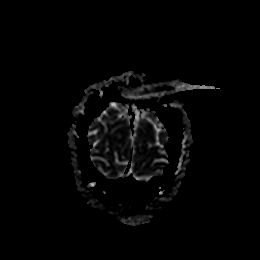

[Series 9: T1 · sagittal · 5.0mm · 0.78mm/px · 2 of 25 slices shown]
[im 1/25]
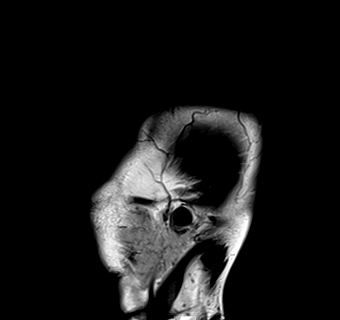
[im 25/25]
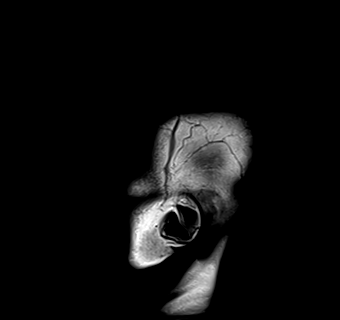

[Series 10: T2 · axial · 5.0mm · 0.72mm/px · z∈[-67,+89]mm · 2 of 27 slices shown (1 of 2)]
[im 1/27]
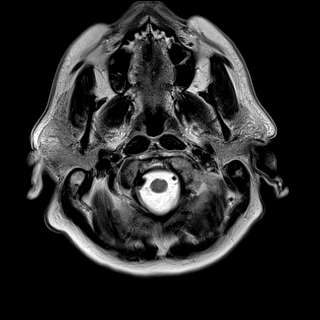
[im 27/27]
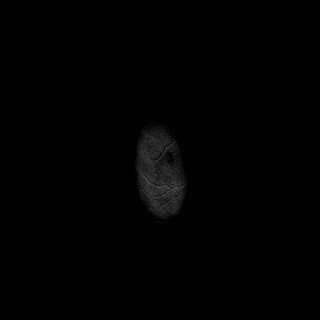

[Series 11: FLAIR · axial · 5.0mm · 0.45mm/px · z∈[-67,+89]mm · 2 of 27 slices shown]
[im 1/27]
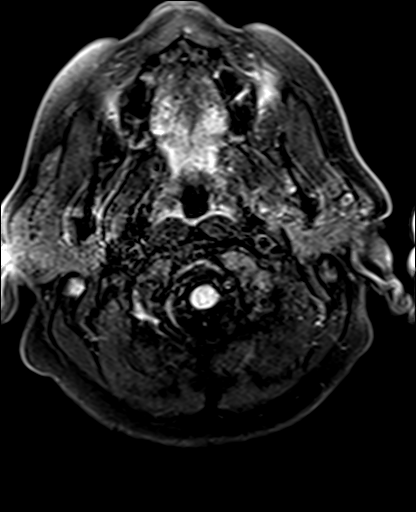
[im 27/27]
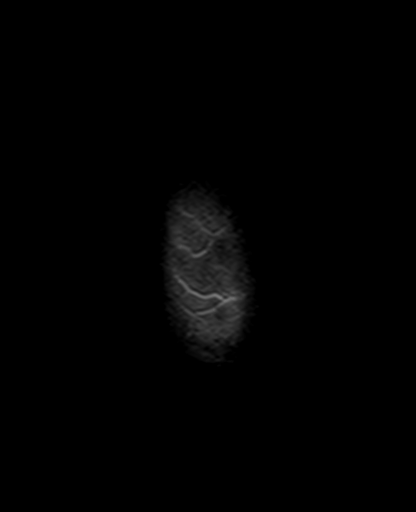

[Series 13: pha_images · axial · 3.0mm · 0.90mm/px · z∈[-77,+100]mm · 5 of 60 slices shown]
[im 1/60]
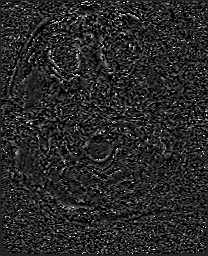
[im 15/60]
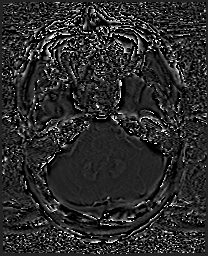
[im 30/60]
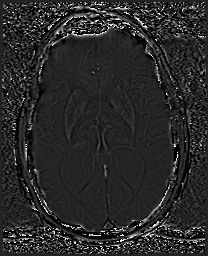
[im 45/60]
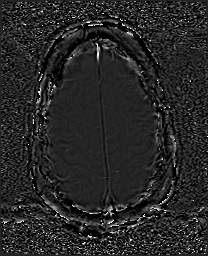
[im 60/60]
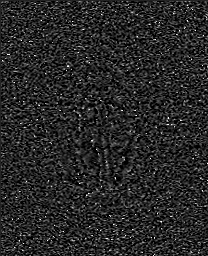

[Series 14: swi_images · axial · 3.0mm · 0.90mm/px · z∈[-77,+100]mm · 5 of 60 slices shown]
[im 1/60]
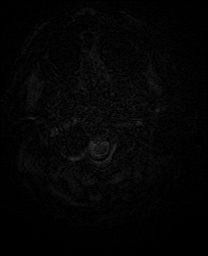
[im 15/60]
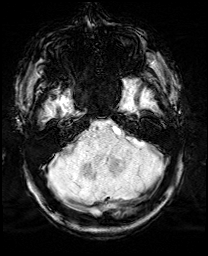
[im 30/60]
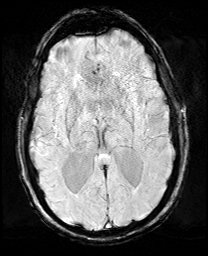
[im 45/60]
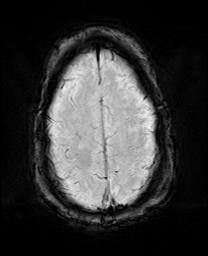
[im 60/60]
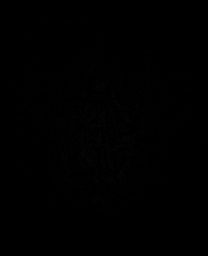

[Series 17: T2 · coronal · 5.0mm · 0.34mm/px · 3 of 29 slices shown (2 of 2)]
[im 1/29]
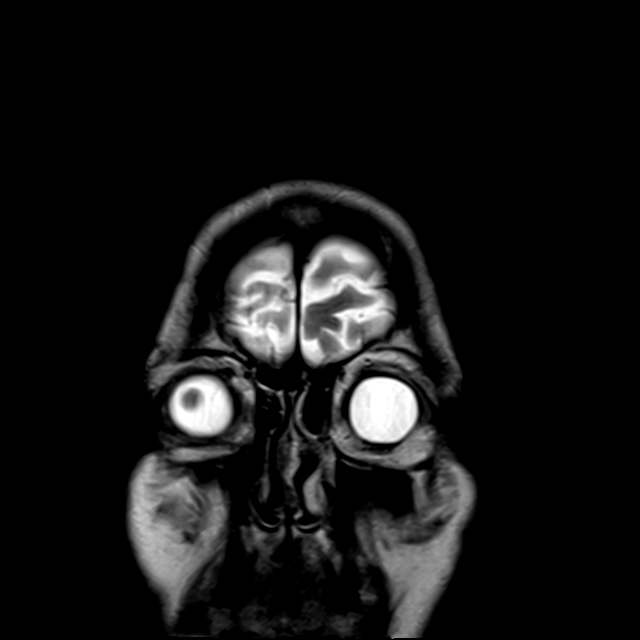
[im 15/29]
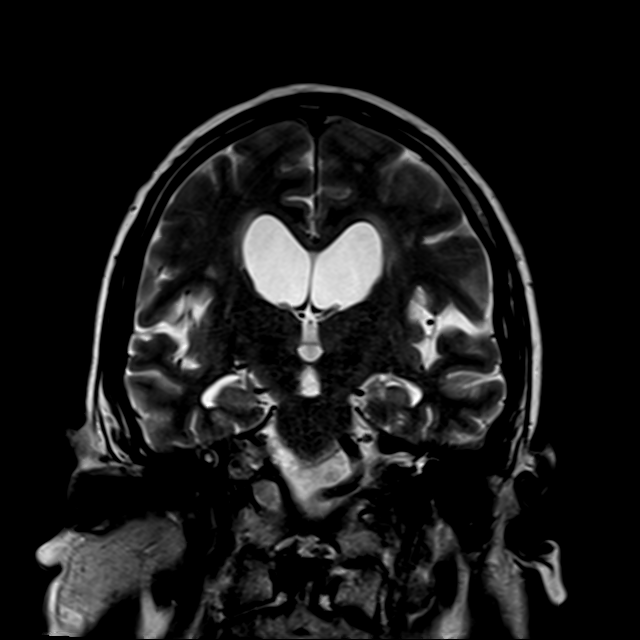
[im 29/29]
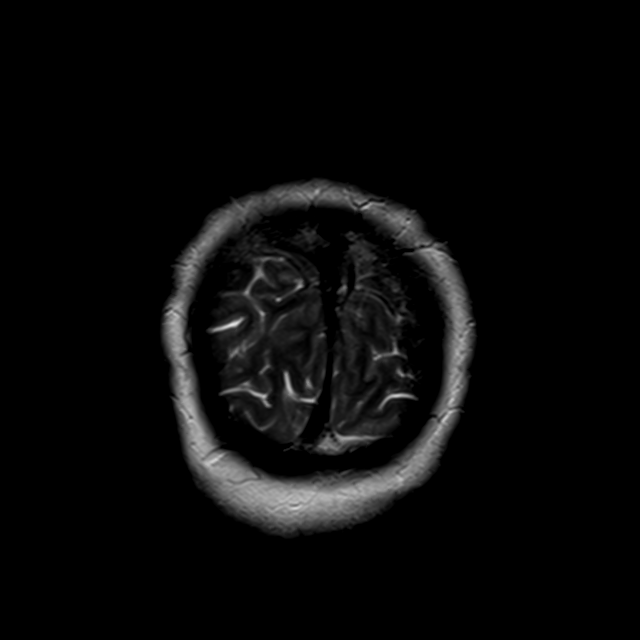

[43 of 48 positions shown; findings below may reference images not displayed]

FINDINGS: BRAIN: No acute infarct, acute hemorrhage or extra-axial collection.
Multifocal white matter hyperintensity, most commonly due to chronic
ischemic microangiopathy. Advanced atrophy for age. No chronic
microhemorrhage. Normal midline structures.

VASCULAR: Major flow voids are preserved.

SKULL AND UPPER CERVICAL SPINE: Normal calvarium and skull base.
Visualized upper cervical spine and soft tissues are normal.

SINUSES/ORBITS: No paranasal sinus fluid levels or advanced mucosal
thickening. No mastoid or middle ear effusion. Normal orbits.
IMPRESSION: 1. No acute intracranial abnormality.
2. Advanced atrophy and findings of chronic small vessel disease.

## 2021-04-10 DIAGNOSIS — M5136 Other intervertebral disc degeneration, lumbar region: Secondary | ICD-10-CM | POA: Diagnosis not present

## 2021-04-10 DIAGNOSIS — M9903 Segmental and somatic dysfunction of lumbar region: Secondary | ICD-10-CM | POA: Diagnosis not present

## 2021-05-08 DIAGNOSIS — M25471 Effusion, right ankle: Secondary | ICD-10-CM | POA: Diagnosis not present

## 2021-05-08 DIAGNOSIS — M109 Gout, unspecified: Secondary | ICD-10-CM | POA: Diagnosis not present

## 2021-05-28 ENCOUNTER — Other Ambulatory Visit: Payer: Self-pay | Admitting: Cardiology

## 2021-06-06 DIAGNOSIS — I10 Essential (primary) hypertension: Secondary | ICD-10-CM | POA: Diagnosis not present

## 2021-06-06 DIAGNOSIS — I480 Paroxysmal atrial fibrillation: Secondary | ICD-10-CM | POA: Diagnosis not present

## 2021-06-06 DIAGNOSIS — I25708 Atherosclerosis of coronary artery bypass graft(s), unspecified, with other forms of angina pectoris: Secondary | ICD-10-CM | POA: Diagnosis not present

## 2021-06-06 DIAGNOSIS — R002 Palpitations: Secondary | ICD-10-CM | POA: Diagnosis not present

## 2021-06-07 ENCOUNTER — Other Ambulatory Visit: Payer: Self-pay | Admitting: Cardiology

## 2021-06-24 DIAGNOSIS — I10 Essential (primary) hypertension: Secondary | ICD-10-CM | POA: Diagnosis not present

## 2021-06-24 DIAGNOSIS — Z23 Encounter for immunization: Secondary | ICD-10-CM | POA: Diagnosis not present

## 2021-06-24 DIAGNOSIS — E785 Hyperlipidemia, unspecified: Secondary | ICD-10-CM | POA: Diagnosis not present

## 2021-06-24 DIAGNOSIS — Z7901 Long term (current) use of anticoagulants: Secondary | ICD-10-CM | POA: Diagnosis not present

## 2021-07-30 DIAGNOSIS — I10 Essential (primary) hypertension: Secondary | ICD-10-CM | POA: Diagnosis not present

## 2021-07-30 DIAGNOSIS — M19011 Primary osteoarthritis, right shoulder: Secondary | ICD-10-CM | POA: Diagnosis not present

## 2021-07-30 DIAGNOSIS — R52 Pain, unspecified: Secondary | ICD-10-CM | POA: Diagnosis not present

## 2021-08-02 DIAGNOSIS — M19011 Primary osteoarthritis, right shoulder: Secondary | ICD-10-CM | POA: Insufficient documentation

## 2021-08-05 ENCOUNTER — Other Ambulatory Visit: Payer: Self-pay | Admitting: Cardiology

## 2021-09-03 ENCOUNTER — Other Ambulatory Visit: Payer: Self-pay | Admitting: Cardiology

## 2021-09-03 DIAGNOSIS — I739 Peripheral vascular disease, unspecified: Secondary | ICD-10-CM

## 2021-09-09 DIAGNOSIS — M109 Gout, unspecified: Secondary | ICD-10-CM | POA: Diagnosis not present

## 2021-10-03 DIAGNOSIS — M9903 Segmental and somatic dysfunction of lumbar region: Secondary | ICD-10-CM | POA: Diagnosis not present

## 2021-10-03 DIAGNOSIS — M5136 Other intervertebral disc degeneration, lumbar region: Secondary | ICD-10-CM | POA: Diagnosis not present

## 2021-10-23 DIAGNOSIS — M5136 Other intervertebral disc degeneration, lumbar region: Secondary | ICD-10-CM | POA: Diagnosis not present

## 2021-10-23 DIAGNOSIS — R040 Epistaxis: Secondary | ICD-10-CM | POA: Diagnosis not present

## 2021-10-23 DIAGNOSIS — I480 Paroxysmal atrial fibrillation: Secondary | ICD-10-CM | POA: Diagnosis not present

## 2021-10-23 DIAGNOSIS — M9903 Segmental and somatic dysfunction of lumbar region: Secondary | ICD-10-CM | POA: Diagnosis not present

## 2021-10-24 DIAGNOSIS — R040 Epistaxis: Secondary | ICD-10-CM | POA: Diagnosis not present

## 2021-10-30 DIAGNOSIS — M5136 Other intervertebral disc degeneration, lumbar region: Secondary | ICD-10-CM | POA: Diagnosis not present

## 2021-10-30 DIAGNOSIS — M9903 Segmental and somatic dysfunction of lumbar region: Secondary | ICD-10-CM | POA: Diagnosis not present

## 2021-11-05 DIAGNOSIS — I10 Essential (primary) hypertension: Secondary | ICD-10-CM | POA: Diagnosis not present

## 2021-11-05 DIAGNOSIS — I480 Paroxysmal atrial fibrillation: Secondary | ICD-10-CM | POA: Diagnosis not present

## 2021-11-05 DIAGNOSIS — I255 Ischemic cardiomyopathy: Secondary | ICD-10-CM | POA: Diagnosis not present

## 2021-11-05 DIAGNOSIS — R002 Palpitations: Secondary | ICD-10-CM | POA: Diagnosis not present

## 2021-11-06 DIAGNOSIS — M9903 Segmental and somatic dysfunction of lumbar region: Secondary | ICD-10-CM | POA: Diagnosis not present

## 2021-11-06 DIAGNOSIS — M5136 Other intervertebral disc degeneration, lumbar region: Secondary | ICD-10-CM | POA: Diagnosis not present

## 2021-11-21 DIAGNOSIS — R002 Palpitations: Secondary | ICD-10-CM | POA: Diagnosis not present

## 2021-11-21 DIAGNOSIS — I255 Ischemic cardiomyopathy: Secondary | ICD-10-CM | POA: Diagnosis not present

## 2021-11-21 DIAGNOSIS — I480 Paroxysmal atrial fibrillation: Secondary | ICD-10-CM | POA: Diagnosis not present

## 2021-11-21 DIAGNOSIS — I10 Essential (primary) hypertension: Secondary | ICD-10-CM | POA: Diagnosis not present

## 2021-12-03 ENCOUNTER — Other Ambulatory Visit: Payer: Self-pay | Admitting: Cardiology

## 2021-12-08 ENCOUNTER — Other Ambulatory Visit: Payer: Self-pay | Admitting: Cardiology

## 2021-12-23 DIAGNOSIS — I255 Ischemic cardiomyopathy: Secondary | ICD-10-CM | POA: Diagnosis not present

## 2021-12-23 DIAGNOSIS — M1A09X1 Idiopathic chronic gout, multiple sites, with tophus (tophi): Secondary | ICD-10-CM | POA: Diagnosis not present

## 2021-12-23 DIAGNOSIS — Z7901 Long term (current) use of anticoagulants: Secondary | ICD-10-CM | POA: Diagnosis not present

## 2021-12-23 DIAGNOSIS — I480 Paroxysmal atrial fibrillation: Secondary | ICD-10-CM | POA: Diagnosis not present

## 2021-12-23 DIAGNOSIS — E785 Hyperlipidemia, unspecified: Secondary | ICD-10-CM | POA: Diagnosis not present

## 2021-12-23 DIAGNOSIS — I10 Essential (primary) hypertension: Secondary | ICD-10-CM | POA: Diagnosis not present

## 2021-12-23 DIAGNOSIS — R002 Palpitations: Secondary | ICD-10-CM | POA: Diagnosis not present

## 2022-01-09 DIAGNOSIS — R0781 Pleurodynia: Secondary | ICD-10-CM | POA: Diagnosis not present

## 2022-01-22 DIAGNOSIS — R0781 Pleurodynia: Secondary | ICD-10-CM | POA: Diagnosis not present

## 2022-02-03 ENCOUNTER — Other Ambulatory Visit: Payer: Self-pay | Admitting: Cardiology

## 2022-02-06 DIAGNOSIS — I255 Ischemic cardiomyopathy: Secondary | ICD-10-CM | POA: Diagnosis not present

## 2022-02-06 DIAGNOSIS — I10 Essential (primary) hypertension: Secondary | ICD-10-CM | POA: Diagnosis not present

## 2022-02-06 DIAGNOSIS — R002 Palpitations: Secondary | ICD-10-CM | POA: Diagnosis not present

## 2022-02-06 DIAGNOSIS — I480 Paroxysmal atrial fibrillation: Secondary | ICD-10-CM | POA: Diagnosis not present

## 2022-02-06 DIAGNOSIS — I471 Supraventricular tachycardia: Secondary | ICD-10-CM | POA: Diagnosis not present

## 2022-02-11 DIAGNOSIS — M7741 Metatarsalgia, right foot: Secondary | ICD-10-CM | POA: Diagnosis not present

## 2022-02-11 DIAGNOSIS — I10 Essential (primary) hypertension: Secondary | ICD-10-CM | POA: Diagnosis not present

## 2022-02-11 DIAGNOSIS — M25871 Other specified joint disorders, right ankle and foot: Secondary | ICD-10-CM | POA: Diagnosis not present

## 2022-02-11 DIAGNOSIS — M7742 Metatarsalgia, left foot: Secondary | ICD-10-CM | POA: Diagnosis not present

## 2022-02-11 DIAGNOSIS — M25872 Other specified joint disorders, left ankle and foot: Secondary | ICD-10-CM | POA: Diagnosis not present

## 2022-02-27 DIAGNOSIS — M5136 Other intervertebral disc degeneration, lumbar region: Secondary | ICD-10-CM | POA: Diagnosis not present

## 2022-02-27 DIAGNOSIS — M9903 Segmental and somatic dysfunction of lumbar region: Secondary | ICD-10-CM | POA: Diagnosis not present

## 2022-03-06 DIAGNOSIS — M9903 Segmental and somatic dysfunction of lumbar region: Secondary | ICD-10-CM | POA: Diagnosis not present

## 2022-03-06 DIAGNOSIS — M5136 Other intervertebral disc degeneration, lumbar region: Secondary | ICD-10-CM | POA: Diagnosis not present

## 2022-03-11 DIAGNOSIS — L570 Actinic keratosis: Secondary | ICD-10-CM | POA: Diagnosis not present

## 2022-03-11 DIAGNOSIS — M9903 Segmental and somatic dysfunction of lumbar region: Secondary | ICD-10-CM | POA: Diagnosis not present

## 2022-03-11 DIAGNOSIS — L718 Other rosacea: Secondary | ICD-10-CM | POA: Diagnosis not present

## 2022-03-11 DIAGNOSIS — D1801 Hemangioma of skin and subcutaneous tissue: Secondary | ICD-10-CM | POA: Diagnosis not present

## 2022-03-11 DIAGNOSIS — M5136 Other intervertebral disc degeneration, lumbar region: Secondary | ICD-10-CM | POA: Diagnosis not present

## 2022-03-11 DIAGNOSIS — L578 Other skin changes due to chronic exposure to nonionizing radiation: Secondary | ICD-10-CM | POA: Diagnosis not present

## 2022-03-25 DIAGNOSIS — M9903 Segmental and somatic dysfunction of lumbar region: Secondary | ICD-10-CM | POA: Diagnosis not present

## 2022-03-25 DIAGNOSIS — M5136 Other intervertebral disc degeneration, lumbar region: Secondary | ICD-10-CM | POA: Diagnosis not present

## 2022-05-12 DIAGNOSIS — I480 Paroxysmal atrial fibrillation: Secondary | ICD-10-CM | POA: Diagnosis not present

## 2022-05-12 DIAGNOSIS — I10 Essential (primary) hypertension: Secondary | ICD-10-CM | POA: Diagnosis not present

## 2022-05-12 DIAGNOSIS — I25708 Atherosclerosis of coronary artery bypass graft(s), unspecified, with other forms of angina pectoris: Secondary | ICD-10-CM | POA: Diagnosis not present

## 2022-05-12 DIAGNOSIS — I255 Ischemic cardiomyopathy: Secondary | ICD-10-CM | POA: Diagnosis not present

## 2022-06-25 DIAGNOSIS — I25708 Atherosclerosis of coronary artery bypass graft(s), unspecified, with other forms of angina pectoris: Secondary | ICD-10-CM | POA: Diagnosis not present

## 2022-06-25 DIAGNOSIS — Z7901 Long term (current) use of anticoagulants: Secondary | ICD-10-CM | POA: Diagnosis not present

## 2022-06-25 DIAGNOSIS — M1A09X1 Idiopathic chronic gout, multiple sites, with tophus (tophi): Secondary | ICD-10-CM | POA: Diagnosis not present

## 2022-06-25 DIAGNOSIS — E785 Hyperlipidemia, unspecified: Secondary | ICD-10-CM | POA: Diagnosis not present

## 2022-06-25 DIAGNOSIS — I10 Essential (primary) hypertension: Secondary | ICD-10-CM | POA: Diagnosis not present

## 2022-06-25 DIAGNOSIS — Z23 Encounter for immunization: Secondary | ICD-10-CM | POA: Diagnosis not present

## 2022-07-17 DIAGNOSIS — Z122 Encounter for screening for malignant neoplasm of respiratory organs: Secondary | ICD-10-CM | POA: Diagnosis not present

## 2022-07-17 DIAGNOSIS — Z87891 Personal history of nicotine dependence: Secondary | ICD-10-CM | POA: Diagnosis not present

## 2022-07-21 DIAGNOSIS — I739 Peripheral vascular disease, unspecified: Secondary | ICD-10-CM | POA: Diagnosis not present

## 2022-07-30 ENCOUNTER — Other Ambulatory Visit: Payer: Self-pay | Admitting: Cardiology

## 2022-08-14 DIAGNOSIS — I739 Peripheral vascular disease, unspecified: Secondary | ICD-10-CM | POA: Diagnosis not present

## 2022-09-03 ENCOUNTER — Other Ambulatory Visit: Payer: Self-pay | Admitting: Cardiology

## 2022-11-24 DIAGNOSIS — I10 Essential (primary) hypertension: Secondary | ICD-10-CM | POA: Diagnosis not present

## 2022-11-24 DIAGNOSIS — R000 Tachycardia, unspecified: Secondary | ICD-10-CM | POA: Insufficient documentation

## 2022-11-24 DIAGNOSIS — I480 Paroxysmal atrial fibrillation: Secondary | ICD-10-CM | POA: Diagnosis not present

## 2022-11-24 DIAGNOSIS — I255 Ischemic cardiomyopathy: Secondary | ICD-10-CM | POA: Diagnosis not present

## 2022-11-24 DIAGNOSIS — E785 Hyperlipidemia, unspecified: Secondary | ICD-10-CM | POA: Diagnosis not present

## 2022-11-24 DIAGNOSIS — I25708 Atherosclerosis of coronary artery bypass graft(s), unspecified, with other forms of angina pectoris: Secondary | ICD-10-CM | POA: Diagnosis not present

## 2022-12-30 DIAGNOSIS — Z1331 Encounter for screening for depression: Secondary | ICD-10-CM | POA: Diagnosis not present

## 2022-12-30 DIAGNOSIS — Z125 Encounter for screening for malignant neoplasm of prostate: Secondary | ICD-10-CM | POA: Diagnosis not present

## 2022-12-30 DIAGNOSIS — I480 Paroxysmal atrial fibrillation: Secondary | ICD-10-CM | POA: Diagnosis not present

## 2022-12-30 DIAGNOSIS — Z7901 Long term (current) use of anticoagulants: Secondary | ICD-10-CM | POA: Diagnosis not present

## 2022-12-30 DIAGNOSIS — M1A09X1 Idiopathic chronic gout, multiple sites, with tophus (tophi): Secondary | ICD-10-CM | POA: Diagnosis not present

## 2022-12-30 DIAGNOSIS — E785 Hyperlipidemia, unspecified: Secondary | ICD-10-CM | POA: Diagnosis not present

## 2022-12-30 DIAGNOSIS — I10 Essential (primary) hypertension: Secondary | ICD-10-CM | POA: Diagnosis not present

## 2023-02-17 DIAGNOSIS — Z0000 Encounter for general adult medical examination without abnormal findings: Secondary | ICD-10-CM | POA: Diagnosis not present

## 2023-02-17 DIAGNOSIS — Z1211 Encounter for screening for malignant neoplasm of colon: Secondary | ICD-10-CM | POA: Diagnosis not present

## 2023-02-17 DIAGNOSIS — I10 Essential (primary) hypertension: Secondary | ICD-10-CM | POA: Diagnosis not present

## 2023-02-17 DIAGNOSIS — Z6835 Body mass index (BMI) 35.0-35.9, adult: Secondary | ICD-10-CM | POA: Diagnosis not present

## 2023-03-04 ENCOUNTER — Encounter: Payer: Self-pay | Admitting: Gastroenterology

## 2023-03-04 ENCOUNTER — Ambulatory Visit: Payer: BC Managed Care – PPO | Admitting: Gastroenterology

## 2023-03-04 VITALS — BP 106/70 | HR 67 | Ht 69.0 in | Wt 238.0 lb

## 2023-03-04 DIAGNOSIS — Z800 Family history of malignant neoplasm of digestive organs: Secondary | ICD-10-CM

## 2023-03-04 DIAGNOSIS — Z86010 Personal history of colonic polyps: Secondary | ICD-10-CM

## 2023-03-04 DIAGNOSIS — Z7901 Long term (current) use of anticoagulants: Secondary | ICD-10-CM

## 2023-03-04 MED ORDER — NA SULFATE-K SULFATE-MG SULF 17.5-3.13-1.6 GM/177ML PO SOLN: 1.0000 | Freq: Once | ORAL | 0 refills | Status: AC

## 2023-03-04 NOTE — Progress Notes (Signed)
Chief Complaint: Set up colonoscopy Primary GI MD: Gentry Fitz  HPI: 57 year old male history of CAD s/p CABG (2021), SVT, A-fib (on Eliquis and Plavix), obesity, presents to establish care.  Echocardiogram 2021 shows ejection fraction 45 to 50%  Patient states his last colonoscopy was done in [place] 07/2018.  Thinks that he had precancerous polyps.  Has had 2 colonoscopies in his life so far.  He does have a family history of colon cancer in his father diagnosed in his early 69s.  Also in his mother diagnosed in her 69s.  Her mother recently passed away but not of her colon cancer.  He denies GI issues today.  Denies melena, hematochezia, upper GI symptoms, change in bowel habits, weight loss.  Past Medical History:  Diagnosis Date   Anxiety    Coronary artery disease    Former smoker    Hypertension    Mixed hyperlipidemia    TIA (transient ischemic attack)     Past Surgical History:  Procedure Laterality Date   CARDIAC CATHETERIZATION     CORONARY ARTERY BYPASS GRAFT N/A 11/07/2019   Procedure: CORONARY ARTERY BYPASS GRAFTING (CABG) x 2 WITH BILATERAL INTERNAL MAMMARY ARTERIES. LIMA TO LAD, RIMA TO DISTAL RCA;  Surgeon: Loreli Slot, MD;  Location: [facility] OR;  Service: Open Heart Surgery;  Laterality: N/A;   LEFT HEART CATH AND CORONARY ANGIOGRAPHY N/A 11/03/2019   Procedure: LEFT HEART CATH AND CORONARY ANGIOGRAPHY;  Surgeon: Elder Negus, MD;  Location: [facility];  Service: Cardiovascular;  Laterality: N/A;   REPAIR EXTENSOR TENDON Right 04/07/2019   Procedure: REPAIR RIGHT THUMB EXTENSOR TENDON;  Surgeon: Betha Loa, MD;  Location: [facility];  Service: Orthopedics;  Laterality: Right;   TEE WITHOUT CARDIOVERSION N/A 11/07/2019   Procedure: Transesophageal Echocardiogram (Tee);  Surgeon: Loreli Slot, MD;  Location: [facility] OR;  Service: Open Heart Surgery;  Laterality: N/A;    Current Outpatient Medications  Medication Sig Dispense  Refill   apixaban (ELIQUIS) 5 MG TABS tablet Take 5 mg by mouth 2 (two) times daily.     aspirin 81 MG chewable tablet Chew 81 mg by mouth once.     atenolol (TENORMIN) 50 MG tablet Take 50 mg by mouth daily.     bisoprolol (ZEBETA) 5 MG tablet Take 5 mg by mouth daily.     diltiazem (CARDIZEM CD) 120 MG 24 hr capsule Take 1 tablet by mouth daily.     allopurinol (ZYLOPRIM) 100 MG tablet Take 200 mg by mouth daily.      atorvastatin (LIPITOR) 40 MG tablet TAKE 1 TABLET BY MOUTH EVERY DAY AT 6PM 30 tablet 5   clopidogrel (PLAVIX) 75 MG tablet TAKE 1 TABLET BY MOUTH EVERY DAY 30 tablet 5   colchicine 0.6 MG tablet Take 0.6 mg by mouth 2 (two) times daily.     diltiazem (CARDIZEM) 30 MG tablet Take 1 tablet (30 mg total) by mouth every 6 (six) hours as needed. (Patient taking differently: Take 30 mg by mouth every 6 (six) hours as needed (afib).) 30 tablet 2   lisinopril (ZESTRIL) 5 MG tablet TAKE 1 TABLET BY MOUTH EVERY DAY 30 tablet 5   Multiple Vitamin (MULTIVITAMIN) tablet Take 1 tablet by mouth daily.      nitroGLYCERIN (NITROSTAT) 0.3 MG SL tablet Place 0.3 mg under the tongue every 5 (five) minutes as needed for chest pain.     Omega-3 Fatty Acids (OMEGA-3 FISH OIL) 1200 MG CAPS Take 2,400  mg by mouth at bedtime.      pantoprazole (PROTONIX) 40 MG tablet Take 1 tablet (40 mg total) by mouth daily as needed (acid reflux). (Patient taking differently: Take 40 mg by mouth daily.) 30 tablet 5   psyllium (METAMUCIL) 58.6 % packet Take 1 packet by mouth daily.     verapamil (CALAN-SR) 120 MG CR tablet One at night po 30 tablet 2   XARELTO 20 MG TABS tablet TAKE 1 TABLET BY MOUTH DAILY WITH SUPPER. 30 tablet 6   zolpidem (AMBIEN) 5 MG tablet Take 2.5-5 mg by mouth at bedtime as needed for sleep.      No current facility-administered medications for this visit.    Allergies as of 03/04/2023 - Review Complete 01/11/2021  Allergen Reaction Noted   Metoprolol tartrate  02/10/2020    Oxycodone-acetaminophen  11/07/2019    Family History  Problem Relation Age of Onset   Dementia Mother    CAD Father        MI in early 109s   Cancer Father    Heart failure Father    Healthy Brother     Social History   Socioeconomic History   Marital status: Married    Spouse name: Barth Kirks   Number of children: 3   Years of education: 12   Highest education level: Not on file  Occupational History   Occupation: retired  Tobacco Use   Smoking status: Former    Current packs/day: 0.00    Average packs/day: 1 pack/day for 25.0 years (25.0 ttl pk-yrs)    Types: Cigarettes    Start date: 45    Quit date: 2010    Years since quitting: 14.5   Smokeless tobacco: Never  Vaping Use   Vaping status: Never Used  Substance and Sexual Activity   Alcohol use: Yes    Alcohol/week: 6.0 - 10.0 standard drinks of alcohol    Types: 6 - 10 Cans of beer per week    Comment: Daily, 01/03/20 none   Drug use: Never   Sexual activity: Not on file  Other Topics Concern   Not on file  Social History Narrative   Lives with wife   Social Determinants of Health   Financial Resource Strain: Low Risk  (11/21/2022)   Received from [facility]   Overall Financial Resource Strain (CARDIA)    Difficulty of Paying Living Expenses: Not hard at all  Food Insecurity: No Food Insecurity (11/21/2022)   Received from [facility]   Hunger Vital Sign    Worried About Running Out of Food in the Last Year: Never true    Ran Out of Food in the Last Year: Never true  Transportation Needs: No Transportation Needs (11/21/2022)   Received from [facility] - Transportation    Lack of Transportation (Medical): No    Lack of Transportation (Non-Medical): No  Physical Activity: Sufficiently Active (11/21/2022)   Received from [facility]   Exercise Vital Sign    Days of Exercise per Week: 5 days    Minutes of Exercise per Session: 60 min  Stress: No Stress Concern Present (11/21/2022)    Received from [facility] - Occupational Stress Questionnaire    Feeling of Stress : Not at all  Social Connections: Moderately Integrated (11/21/2022)   Received from [facility]   Social Network    How would you rate your social network (family, work, friends)?: Adequate participation with social networks  Intimate Partner Violence: Not At Risk (11/21/2022)   Received from [facility]   HITS    Over the last 12 months how often did your partner physically hurt you?: 1    Over the last 12 months how often did your partner insult you or talk down to you?: 1    Over the last 12 months how often did your partner threaten you with physical harm?: 1    Over the last 12 months how often did your partner scream or curse at you?: 1    Review of Systems:    Constitutional: No weight loss, fever, chills, weakness or fatigue HEENT: Eyes: No change in vision               Ears, Nose, Throat:  No change in hearing or congestion Skin: No rash or itching Cardiovascular: No chest pain, chest pressure or palpitations   Respiratory: No SOB or cough Gastrointestinal: See HPI and otherwise negative Genitourinary: No dysuria or change in urinary frequency Neurological: No headache, dizziness or syncope Musculoskeletal: No new muscle or joint pain Hematologic: No bleeding or bruising Psychiatric: No history of depression or anxiety    Physical Exam:  Vital signs: There were no vitals taken for this visit.  Constitutional: NAD, Well developed, Well nourished, alert and cooperative Head:  Normocephalic and atraumatic. Eyes:   PEERL, EOMI. No icterus. Conjunctiva pink. Respiratory: Respirations even and unlabored. Lungs clear to auscultation bilaterally.   No wheezes, crackles, or rhonchi.  Cardiovascular:  Regular rate and rhythm. No peripheral edema, cyanosis or pallor.  Gastrointestinal:  Soft, nondistended, nontender. No rebound or guarding. Normal  bowel sounds. No appreciable masses or hepatomegaly. Rectal:  Not performed.  Msk:  Symmetrical without gross deformities. Without edema, no deformity or joint abnormality.  Neurologic:  Alert and  oriented x4;  grossly normal neurologically.  Skin:   Dry and intact without significant lesions or rashes. Psychiatric: Oriented to person, place and time. Demonstrates good judgement and reason without abnormal affect or behaviors.   RELEVANT LABS AND IMAGING: CBC    Component Value Date/Time   WBC 8.8 01/11/2021 1722   RBC 5.06 01/11/2021 1722   HGB 15.6 01/11/2021 1722   HCT 45.1 01/11/2021 1722   PLT 196 01/11/2021 1722   MCV 89.1 01/11/2021 1722   MCH 30.8 01/11/2021 1722   MCHC 34.6 01/11/2021 1722   RDW 12.9 01/11/2021 1722   LYMPHSABS 2.3 01/11/2021 1722   MONOABS 0.6 01/11/2021 1722   EOSABS 0.1 01/11/2021 1722   BASOSABS 0.1 01/11/2021 1722    CMP     Component Value Date/Time   NA 136 01/11/2021 1722   K 3.8 01/11/2021 1722   CL 99 01/11/2021 1722   CO2 30 01/11/2021 1722   GLUCOSE 104 (H) 01/11/2021 1722   BUN 14 01/11/2021 1722   CREATININE 0.99 01/11/2021 1722   CALCIUM 9.1 01/11/2021 1722   PROT 6.9 12/15/2019 1850   ALBUMIN 3.9 12/15/2019 1850   AST 30 12/15/2019 1850   ALT 32 12/15/2019 1850   ALKPHOS 69 12/15/2019 1850   BILITOT 0.8 12/15/2019 1850   GFRNONAA >60 01/11/2021 1722   GFRAA >60 02/02/2020 1017     Assessment/Plan:   Family history of colon cancer History of colonic polyps Reports last colonoscopy 07/2018 with a repeat of 5 years and previous history of colon polyps that were precancerous.  Family history in mother and father diagnosed in their 76s.  His ex-wife also recently passed of colon  cancer in her 22s. --- Colonoscopy for further evaluation to be scheduled 07/2023 --- I thoroughly discussed the procedure with the patient (at bedside) to include nature of the procedure, alternatives, benefits, and risks (including but not limited  to bleeding, infection, perforation, anesthesia/cardiac pulmonary complications).  Patient verbalized understanding and gave verbal consent to proceed with procedure.  --- Discussed with him that his children will need to get their screening colonoscopies starting at age 14  Chronic anticoagulation --- Cardiac clearance from Dr. Clide Cliff of [facility] --- Hold Eliquis for 2 days and Plavix for 5 days prior to procedure.  Will get blood thinner cease from cardiologist (Dr. Clide Cliff)   Boone Master, PA-C [facility] 03/04/2023, 8:29 AM  Cc: Malka So., MD

## 2023-03-04 NOTE — Patient Instructions (Signed)
You have been scheduled for a colonoscopy. Please follow written instructions given to you at your visit today.   Please pick up your prep supplies at the pharmacy within the next 1-3 days.  If you use inhalers (even only as needed), please bring them with you on the day of your procedure.  DO NOT TAKE 7 DAYS PRIOR TO TEST- Trulicity (dulaglutide) Ozempic, Wegovy (semaglutide) Mounjaro (tirzepatide) Bydureon Bcise (exanatide extended release)  DO NOT TAKE 1 DAY PRIOR TO YOUR TEST Rybelsus (semaglutide) Adlyxin (lixisenatide) Victoza (liraglutide) Byetta (exanatide) ___________________________________________________________________________  _______________________________________________________  If your blood pressure at your visit was 140/90 or greater, please contact your primary care physician to follow up on this.  _______________________________________________________  If you are age 3 or older, your body mass index should be between 23-30. Your Body mass index is 35.15 kg/m. If this is out of the aforementioned range listed, please consider follow up with your Primary Care Provider.  If you are age 42 or younger, your body mass index should be between 19-25. Your Body mass index is 35.15 kg/m. If this is out of the aformentioned range listed, please consider follow up with your Primary Care Provider.   ________________________________________________________  The [facility] providers would like to encourage you to use [facility] to communicate with providers for non-urgent requests or questions.  Due to long hold times on the telephone, sending your provider a message by [facility] may be a faster and more efficient way to get a response.  Please allow 48 business hours for a response.  Please remember that this is for non-urgent requests.  _______________________________________________________ It was a pleasure to see you today!  Thank you for trusting me with your  gastrointestinal care!

## 2023-03-04 NOTE — Progress Notes (Signed)
Agree with assessment and plan as outlined.  

## 2023-03-13 ENCOUNTER — Telehealth: Payer: Self-pay

## 2023-03-13 NOTE — Telephone Encounter (Signed)
Called patient to notify him of Plavix and Eliquis holding, patient verbalized understanding.

## 2023-06-02 DIAGNOSIS — I480 Paroxysmal atrial fibrillation: Secondary | ICD-10-CM | POA: Diagnosis not present

## 2023-06-02 DIAGNOSIS — R002 Palpitations: Secondary | ICD-10-CM | POA: Diagnosis not present

## 2023-06-02 DIAGNOSIS — I25708 Atherosclerosis of coronary artery bypass graft(s), unspecified, with other forms of angina pectoris: Secondary | ICD-10-CM | POA: Diagnosis not present

## 2023-06-02 DIAGNOSIS — I10 Essential (primary) hypertension: Secondary | ICD-10-CM | POA: Diagnosis not present

## 2023-06-02 DIAGNOSIS — I471 Supraventricular tachycardia, unspecified: Secondary | ICD-10-CM | POA: Diagnosis not present

## 2023-06-15 ENCOUNTER — Encounter: Payer: Self-pay | Admitting: Gastroenterology

## 2023-07-20 ENCOUNTER — Encounter: Payer: Self-pay | Admitting: Certified Registered Nurse Anesthetist

## 2023-07-22 ENCOUNTER — Ambulatory Visit: Payer: BC Managed Care – PPO | Admitting: Gastroenterology

## 2023-07-22 ENCOUNTER — Encounter: Payer: Self-pay | Admitting: Gastroenterology

## 2023-07-22 VITALS — BP 116/71 | HR 56 | Temp 97.3°F | Resp 13 | Ht 69.0 in | Wt 238.0 lb

## 2023-07-22 DIAGNOSIS — Z1211 Encounter for screening for malignant neoplasm of colon: Secondary | ICD-10-CM | POA: Diagnosis not present

## 2023-07-22 DIAGNOSIS — Z860100 Personal history of colon polyps, unspecified: Secondary | ICD-10-CM

## 2023-07-22 DIAGNOSIS — D125 Benign neoplasm of sigmoid colon: Secondary | ICD-10-CM

## 2023-07-22 DIAGNOSIS — K5730 Diverticulosis of large intestine without perforation or abscess without bleeding: Secondary | ICD-10-CM | POA: Diagnosis not present

## 2023-07-22 DIAGNOSIS — K5520 Angiodysplasia of colon without hemorrhage: Secondary | ICD-10-CM | POA: Diagnosis not present

## 2023-07-22 DIAGNOSIS — Z800 Family history of malignant neoplasm of digestive organs: Secondary | ICD-10-CM

## 2023-07-22 DIAGNOSIS — K648 Other hemorrhoids: Secondary | ICD-10-CM | POA: Diagnosis not present

## 2023-07-22 MED ORDER — SODIUM CHLORIDE 0.9 % IV SOLN: 500.0000 mL | Freq: Once | INTRAVENOUS | Status: DC

## 2023-07-22 NOTE — Op Note (Signed)
[facility] Patient Name: Sean Flynn Procedure Date: 07/22/2023 7:57 AM MRN: [id number] Endoscopist: Viviann Spare P. Adela Lank , MD, [id number] Age: 57 Referring MD:  Date of Birth: 02-18-1966 Gender: Male Account #: [id number] Procedure:                Colonoscopy Indications:              High risk colon cancer surveillance: Personal                            history of colonic polyps - polyps 5 years ago                            reportedly, both mother and father had colon cancer                            (dx age 20s) Medicines:                Monitored Anesthesia Care Procedure:                Pre-Anesthesia Assessment:                           - Prior to the procedure, a History and Physical                            was performed, and patient medications and                            allergies were reviewed. The patient's tolerance of                            previous anesthesia was also reviewed. The risks                            and benefits of the procedure and the sedation                            options and risks were discussed with the patient.                            All questions were answered, and informed consent                            was obtained. Prior Anticoagulants: The patient has                            taken Eliquis (apixaban), last dose was 2 days                            prior to procedure. On Plavix, last dose was 5 days                            ago. ASA Grade Assessment: III - A patient with  severe systemic disease. After reviewing the risks                            and benefits, the patient was deemed in                            satisfactory condition to undergo the procedure.                           After obtaining informed consent, the colonoscope                            was passed under direct vision. Throughout the                            procedure, the patient's blood  pressure, pulse, and                            oxygen saturations were monitored continuously. The                            Olympus Scope SN [id number] was introduced through the                            anus and advanced to the the cecum, identified by                            appendiceal orifice and ileocecal valve. The                            colonoscopy was performed without difficulty. The                            patient tolerated the procedure well. The quality                            of the bowel preparation was good. The ileocecal                            valve, appendiceal orifice, and rectum were                            photographed. Scope In: 8:05:24 AM Scope Out: 8:24:19 AM Scope Withdrawal Time: 0 hours 16 minutes 14 seconds  Total Procedure Duration: 0 hours 18 minutes 55 seconds  Findings:                 The perianal and digital rectal examinations were                            normal.                           A few small-mouthed diverticula were found in the  sigmoid colon and cecum.                           A single small angiodysplastic lesion was found in                            the cecum.                           A 3 mm polyp was found in the sigmoid colon. The                            polyp was sessile. The polyp was removed with a                            cold snare. Resection and retrieval were complete.                           Internal hemorrhoids were found during retroflexion.                           The exam was otherwise without abnormality. Complications:            No immediate complications. Estimated blood loss:                            Minimal. Estimated Blood Loss:     Estimated blood loss was minimal. Impression:               - Diverticulosis in the sigmoid colon and in the                            cecum.                           - A single colonic angiodysplastic lesion.                            - One 3 mm polyp in the sigmoid colon, removed with                            a cold snare. Resected and retrieved.                           - Internal hemorrhoids.                           - The examination was otherwise normal. Recommendation:           - Patient has a contact number available for                            emergencies. The signs and symptoms of potential                            delayed complications were discussed with the  patient. Return to normal activities tomorrow.                            Written discharge instructions were provided to the                            patient.                           - Resume previous diet.                           - Continue present medications.                           - Resume both Eliquis and Plavix tomorrow.                           - Await pathology results. Viviann Spare P. [name], MD 07/22/2023 8:30:03 AM This report has been signed electronically.

## 2023-07-22 NOTE — Progress Notes (Signed)
[facility] History and Physical   Primary Care Physician:  Sean So., MD   Reason for Procedure:   History of colon polyps, family history of colon cancer  Plan:    colonoscopy     HPI: Sean Flynn is a 56 y.o. male  here for colonoscopy surveillance - reported history of polyps, last exam 2019 (not in our system). Also, both parents had colon cancer dx age 33s. History of AF and TIA - on both Plavix and Eliquis - plavix held for 5 days and Eliquis held for 2 days.   Patient denies any bowel symptoms at this time. Otherwise feels well without any cardiopulmonary symptoms.   I have discussed risks / benefits of anesthesia and endoscopic procedure with Sean Flynn and they wish to proceed with the exams as outlined today.    Past Medical History:  Diagnosis Date   Anxiety    Atrial fibrillation (HCC)    Coronary artery disease    Former smoker    Gout    Hypertension    Mixed hyperlipidemia    TIA (transient ischemic attack)     Past Surgical History:  Procedure Laterality Date   CARDIAC CATHETERIZATION     CORONARY ARTERY BYPASS GRAFT N/A 11/07/2019   Procedure: CORONARY ARTERY BYPASS GRAFTING (CABG) x 2 WITH BILATERAL INTERNAL MAMMARY ARTERIES. LIMA TO LAD, RIMA TO DISTAL RCA;  Surgeon: Loreli Slot, MD;  Location: [facility] OR;  Service: Open Heart Surgery;  Laterality: N/A;   LEFT HEART CATH AND CORONARY ANGIOGRAPHY N/A 11/03/2019   Procedure: LEFT HEART CATH AND CORONARY ANGIOGRAPHY;  Surgeon: Elder Negus, MD;  Location: [facility];  Service: Cardiovascular;  Laterality: N/A;   REPAIR EXTENSOR TENDON Right 04/07/2019   Procedure: REPAIR RIGHT THUMB EXTENSOR TENDON;  Surgeon: Betha Loa, MD;  Location: [facility];  Service: Orthopedics;  Laterality: Right;   TEE WITHOUT CARDIOVERSION N/A 11/07/2019   Procedure: Transesophageal Echocardiogram (Tee);  Surgeon: Loreli Slot, MD;  Location: [facility] OR;  Service: Open  Heart Surgery;  Laterality: N/A;    Prior to Admission medications   Medication Sig Start Date End Date Taking? Authorizing Provider  allopurinol (ZYLOPRIM) 100 MG tablet Take 200 mg by mouth daily.  12/21/19  Yes [provider]  atorvastatin (LIPITOR) 40 MG tablet TAKE 1 TABLET BY MOUTH EVERY DAY AT 6PM 06/07/21  Yes [name], Manish J, MD  bisoprolol (ZEBETA) 5 MG tablet Take 2.5 mg by mouth daily. 12/30/22  Yes [provider]  lisinopril (ZESTRIL) 5 MG tablet TAKE 1 TABLET BY MOUTH EVERY DAY 09/03/22  Yes Yates Decamp, MD  Multiple Vitamin (MULTIVITAMIN) tablet Take 1 tablet by mouth daily.    Yes Alver Fisher, RN  Omega-3 Fatty Acids (OMEGA-3 FISH OIL) 1200 MG CAPS Take 2,400 mg by mouth at bedtime.    Yes Alver Fisher, RN  pantoprazole (PROTONIX) 40 MG tablet Take 1 tablet (40 mg total) by mouth daily as needed (acid reflux). Patient taking differently: Take 40 mg by mouth daily. 07/10/20  Yes [name], Manish J, MD  psyllium (METAMUCIL) 58.6 % packet Take 1 packet by mouth daily.   Yes [provider]  apixaban (ELIQUIS) 5 MG TABS tablet Take 5 mg by mouth 2 (two) times daily. 02/01/22   [provider]  clopidogrel (PLAVIX) 75 MG tablet TAKE 1 TABLET BY MOUTH EVERY DAY Patient taking differently: Take 75 mg by mouth every other day. 03/27/21   [name],  Manish J, MD  diltiazem (CARDIZEM) 30 MG tablet Take 1 tablet (30 mg total) by mouth every 6 (six) hours as needed. Patient not taking: Reported on 07/22/2023 02/10/20   Elder Negus, MD  nitroGLYCERIN (NITROSTAT) 0.3 MG SL tablet Place 0.3 mg under the tongue every 5 (five) minutes as needed for chest pain. Patient not taking: Reported on 03/04/2023 09/18/20   [provider]  zolpidem (AMBIEN) 5 MG tablet Take 2.5-5 mg by mouth at bedtime as needed for sleep.  Patient not taking: Reported on 03/04/2023    Alver Fisher, RN    Current Outpatient Medications  Medication Sig  Dispense Refill   allopurinol (ZYLOPRIM) 100 MG tablet Take 200 mg by mouth daily.      atorvastatin (LIPITOR) 40 MG tablet TAKE 1 TABLET BY MOUTH EVERY DAY AT 6PM 30 tablet 5   bisoprolol (ZEBETA) 5 MG tablet Take 2.5 mg by mouth daily.     lisinopril (ZESTRIL) 5 MG tablet TAKE 1 TABLET BY MOUTH EVERY DAY 30 tablet 5   Multiple Vitamin (MULTIVITAMIN) tablet Take 1 tablet by mouth daily.      Omega-3 Fatty Acids (OMEGA-3 FISH OIL) 1200 MG CAPS Take 2,400 mg by mouth at bedtime.      pantoprazole (PROTONIX) 40 MG tablet Take 1 tablet (40 mg total) by mouth daily as needed (acid reflux). (Patient taking differently: Take 40 mg by mouth daily.) 30 tablet 5   psyllium (METAMUCIL) 58.6 % packet Take 1 packet by mouth daily.     apixaban (ELIQUIS) 5 MG TABS tablet Take 5 mg by mouth 2 (two) times daily.     clopidogrel (PLAVIX) 75 MG tablet TAKE 1 TABLET BY MOUTH EVERY DAY (Patient taking differently: Take 75 mg by mouth every other day.) 30 tablet 5   diltiazem (CARDIZEM) 30 MG tablet Take 1 tablet (30 mg total) by mouth every 6 (six) hours as needed. (Patient not taking: Reported on 07/22/2023) 30 tablet 2   nitroGLYCERIN (NITROSTAT) 0.3 MG SL tablet Place 0.3 mg under the tongue every 5 (five) minutes as needed for chest pain. (Patient not taking: Reported on 03/04/2023)     zolpidem (AMBIEN) 5 MG tablet Take 2.5-5 mg by mouth at bedtime as needed for sleep.  (Patient not taking: Reported on 03/04/2023)     Current Facility-Administered Medications  Medication Dose Route Frequency Provider Last Rate Last Admin   0.9 %  sodium chloride infusion  500 mL Intravenous Once [name], Willaim Rayas, MD        Allergies as of 07/22/2023 - Review Complete 07/22/2023  Allergen Reaction Noted   Metoprolol tartrate  02/10/2020   Oxycodone-acetaminophen  11/07/2019    Family History  Problem Relation Age of Onset   Dementia Mother    Colon cancer Mother    Colon polyps Father    Colon cancer Father         Had a section of the colon removed   CAD Father        MI in early 52s   Cancer Father    Heart failure Father    Healthy Brother    Esophageal cancer Neg Hx    Rectal cancer Neg Hx    Stomach cancer Neg Hx     Social History   Socioeconomic History   Marital status: Married    Spouse name: Barth Kirks   Number of children: 3   Years of education: 12   Highest education level: Not on file  Occupational History   Occupation: retired  Tobacco Use   Smoking status: Former    Current packs/day: 0.00    Average packs/day: 1 pack/day for 25.0 years (25.0 ttl pk-yrs)    Types: Cigarettes    Start date: 30    Quit date: 2010    Years since quitting: 14.9   Smokeless tobacco: Never  Vaping Use   Vaping status: Never Used  Substance and Sexual Activity   Alcohol use: Yes    Alcohol/week: 6.0 - 10.0 standard drinks of alcohol    Types: 6 - 10 Cans of beer per week    Comment: occ   Drug use: Never   Sexual activity: Not on file  Other Topics Concern   Not on file  Social History Narrative   Lives with wife   Social Drivers of Health   Financial Resource Strain: Low Risk  (11/21/2022)   Received from [facility]   Overall Financial Resource Strain (CARDIA)    Difficulty of Paying Living Expenses: Not hard at all  Food Insecurity: No Food Insecurity (11/21/2022)   Received from [facility]   Hunger Vital Sign    Worried About Running Out of Food in the Last Year: Never true    Ran Out of Food in the Last Year: Never true  Transportation Needs: No Transportation Needs (11/21/2022)   Received from [facility] - Transportation    Lack of Transportation (Medical): No    Lack of Transportation (Non-Medical): No  Physical Activity: Sufficiently Active (11/21/2022)   Received [facility]   Exercise Vital Sign    Days of Exercise per Week: 5 days    Minutes of Exercise per Session: 60 min  Stress: No Stress Concern Present (11/21/2022)   Received from [facility] - Occupational Stress Questionnaire    Feeling of Stress : Not at all  Social Connections: Moderately Integrated (11/21/2022)   Received from [facility]   Social Network    How would you rate your social network (family, work, friends)?: Adequate participation with social networks  Intimate Partner Violence: Not At Risk (11/21/2022)   Received from [facility]   HITS    Over the last 12 months how often did your partner physically hurt you?: Never    Over the last 12 months how often did your partner insult you or talk down to you?: Never    Over the last 12 months how often did your partner threaten you with physical harm?: Never    Over the last 12 months how often did your partner scream or curse at you?: Never    Review of Systems: All other review of systems negative except as mentioned in the HPI.  Physical Exam: Vital signs BP (!) 127/94   Pulse 62   Temp (!) 97.3 F (36.3 C) (Temporal)   Ht 5\' 9"  (1.753 m)   Wt 238 lb (108 kg)   SpO2 98%   BMI 35.15 kg/m   General:   Alert,  Well-developed, pleasant and cooperative in NAD Lungs:  Clear throughout to auscultation.   Heart:  Regular rate and rhythm Abdomen:  Soft, nontender and nondistended.   Neuro/Psych:  Alert and cooperative. Normal mood and affect. A and O x 3  Harlin Rain, MD [facility]

## 2023-07-22 NOTE — Progress Notes (Signed)
Report given to PACU, vss 

## 2023-07-22 NOTE — Patient Instructions (Addendum)
- Resume previous diet.                           - Continue present medications.                           - Resume both Eliquis and Plavix tomorrow.                           - Await pathology results. 1 polyp removed and sent to pathology                           - Handouts on findings given to patient. (Diverticulosis, polyps, hemorrhoids)  YOU HAD AN ENDOSCOPIC PROCEDURE TODAY AT THE  ENDOSCOPY CENTER:   Refer to the procedure report that was given to you for any specific questions about what was found during the examination.  If the procedure report does not answer your questions, please call your gastroenterologist to clarify.  If you requested that your care partner not be given the details of your procedure findings, then the procedure report has been included in a sealed envelope for you to review at your convenience later.  YOU SHOULD EXPECT: Some feelings of bloating in the abdomen. Passage of more gas than usual.  Walking can help get rid of the air that was put into your GI tract during the procedure and reduce the bloating. If you had a lower endoscopy (such as a colonoscopy or flexible sigmoidoscopy) you may notice spotting of blood in your stool or on the toilet paper. If you underwent a bowel prep for your procedure, you may not have a normal bowel movement for a few days.  Please Note:  You might notice some irritation and congestion in your nose or some drainage.  This is from the oxygen used during your procedure.  There is no need for concern and it should clear up in a day or so.  SYMPTOMS TO REPORT IMMEDIATELY:  Following lower endoscopy (colonoscopy or flexible sigmoidoscopy):  Excessive amounts of blood in the stool  Significant tenderness or worsening of abdominal pains  Swelling of the abdomen that is new, acute  Fever of 100F or higher   For urgent or emergent issues, a gastroenterologist can be reached at any hour by calling [phone]  [phone]. Do not use MyChart messaging for urgent concerns.    DIET:  We do recommend a small meal at first, but then you may proceed to your regular diet.  Drink plenty of fluids but you should avoid alcoholic beverages for 24 hours.  ACTIVITY:  You should plan to take it easy for the rest of today and you should NOT DRIVE or use heavy machinery until tomorrow (because of the sedation medicines used during the test).    FOLLOW UP: Our staff will call the number listed on your records the next business day following your procedure.  We will call around 7:15- 8:00 am to check on you and address any questions or concerns that you may have regarding the information given to you following your procedure. If we do not reach you, we will leave a message.     If any biopsies were taken you will be contacted by phone or by letter within the next 1-3 weeks.  Please call us at [phone] if you have not  heard about the biopsies in 3 weeks.    SIGNATURES/CONFIDENTIALITY: You and/or your care partner have signed paperwork which will be entered into your electronic medical record.  These signatures attest to the fact that that the information above on your After Visit Summary has been reviewed and is understood.  Full responsibility of the confidentiality of this discharge information lies with you and/or your care-partner.

## 2023-07-22 NOTE — Progress Notes (Signed)
Called to room to assist during endoscopic procedure.  Patient ID and intended procedure confirmed with present staff. Received instructions for my participation in the procedure from the performing physician.  

## 2023-07-23 ENCOUNTER — Telehealth: Payer: Self-pay | Admitting: *Deleted

## 2023-07-23 NOTE — Telephone Encounter (Signed)
Attempted f/u phone call. No answer. Left message. °

## 2023-07-24 LAB — SURGICAL PATHOLOGY

## 2023-09-10 ENCOUNTER — Other Ambulatory Visit: Payer: Self-pay

## 2023-09-10 MED ORDER — LISINOPRIL 5 MG PO TABS: 5.0000 mg | ORAL_TABLET | Freq: Every day | ORAL | 0 refills | Status: AC

## 2023-09-14 DIAGNOSIS — I480 Paroxysmal atrial fibrillation: Secondary | ICD-10-CM | POA: Diagnosis not present

## 2023-09-14 DIAGNOSIS — Z1331 Encounter for screening for depression: Secondary | ICD-10-CM | POA: Diagnosis not present

## 2023-09-14 DIAGNOSIS — Z7901 Long term (current) use of anticoagulants: Secondary | ICD-10-CM | POA: Diagnosis not present

## 2023-09-14 DIAGNOSIS — E785 Hyperlipidemia, unspecified: Secondary | ICD-10-CM | POA: Diagnosis not present

## 2023-09-14 DIAGNOSIS — I10 Essential (primary) hypertension: Secondary | ICD-10-CM | POA: Diagnosis not present

## 2023-09-14 DIAGNOSIS — M1A09X1 Idiopathic chronic gout, multiple sites, with tophus (tophi): Secondary | ICD-10-CM | POA: Diagnosis not present
